# Patient Record
Sex: Male | Born: 1945 | Race: White | Hispanic: No | Marital: Single | Smoking: Never smoker
Health system: Southern US, Community
[De-identification: ages and names within clinical notes are randomized; demographics above are authoritative.]

## PROBLEM LIST (undated history)

## (undated) DIAGNOSIS — K469 Unspecified abdominal hernia without obstruction or gangrene: Secondary | ICD-10-CM

## (undated) DIAGNOSIS — I4891 Unspecified atrial fibrillation: Secondary | ICD-10-CM

## (undated) DIAGNOSIS — G4730 Sleep apnea, unspecified: Secondary | ICD-10-CM

## (undated) DIAGNOSIS — F329 Major depressive disorder, single episode, unspecified: Secondary | ICD-10-CM

## (undated) DIAGNOSIS — I10 Essential (primary) hypertension: Secondary | ICD-10-CM

## (undated) DIAGNOSIS — Z950 Presence of cardiac pacemaker: Secondary | ICD-10-CM

## (undated) DIAGNOSIS — H269 Unspecified cataract: Secondary | ICD-10-CM

## (undated) DIAGNOSIS — D689 Coagulation defect, unspecified: Secondary | ICD-10-CM

## (undated) DIAGNOSIS — F419 Anxiety disorder, unspecified: Secondary | ICD-10-CM

## (undated) DIAGNOSIS — I4892 Unspecified atrial flutter: Secondary | ICD-10-CM

## (undated) DIAGNOSIS — Z5189 Encounter for other specified aftercare: Secondary | ICD-10-CM

## (undated) DIAGNOSIS — R001 Bradycardia, unspecified: Secondary | ICD-10-CM

## (undated) DIAGNOSIS — I2510 Atherosclerotic heart disease of native coronary artery without angina pectoris: Secondary | ICD-10-CM

## (undated) DIAGNOSIS — F32A Depression, unspecified: Secondary | ICD-10-CM

## (undated) DIAGNOSIS — E785 Hyperlipidemia, unspecified: Secondary | ICD-10-CM

## (undated) DIAGNOSIS — M1990 Unspecified osteoarthritis, unspecified site: Secondary | ICD-10-CM

## (undated) HISTORY — PX: PACEMAKER INSERTION: SHX728

## (undated) HISTORY — DX: Coagulation defect, unspecified: D68.9

## (undated) HISTORY — DX: Atherosclerotic heart disease of native coronary artery without angina pectoris: I25.10

## (undated) HISTORY — DX: Essential (primary) hypertension: I10

## (undated) HISTORY — PX: TONSILLECTOMY: SUR1361

## (undated) HISTORY — PX: PERMANENT PACEMAKER INSERTION: SHX6023

## (undated) HISTORY — DX: Hyperlipidemia, unspecified: E78.5

## (undated) HISTORY — PX: OTHER SURGICAL HISTORY: SHX169

## (undated) HISTORY — DX: Unspecified cataract: H26.9

## (undated) HISTORY — DX: Sleep apnea, unspecified: G47.30

## (undated) HISTORY — DX: Encounter for other specified aftercare: Z51.89

## (undated) HISTORY — DX: Major depressive disorder, single episode, unspecified: F32.9

## (undated) HISTORY — DX: Bradycardia, unspecified: R00.1

## (undated) HISTORY — DX: Depression, unspecified: F32.A

## (undated) HISTORY — DX: Unspecified osteoarthritis, unspecified site: M19.90

## (undated) HISTORY — DX: Unspecified atrial fibrillation: I48.91

## (undated) HISTORY — DX: Presence of cardiac pacemaker: Z95.0

## (undated) HISTORY — DX: Unspecified atrial flutter: I48.92

## (undated) HISTORY — PX: CATARACT EXTRACTION: SUR2

## (undated) HISTORY — PX: REPLACEMENT TOTAL KNEE: SUR1224

## (undated) HISTORY — DX: Unspecified abdominal hernia without obstruction or gangrene: K46.9

## (undated) HISTORY — DX: Anxiety disorder, unspecified: F41.9

## (undated) HISTORY — PX: COLONOSCOPY: SHX174

---

## 1998-06-07 ENCOUNTER — Encounter: Payer: Self-pay | Admitting: Family Medicine

## 1998-06-07 ENCOUNTER — Ambulatory Visit (HOSPITAL_COMMUNITY): Admission: RE | Admit: 1998-06-07 | Discharge: 1998-06-07 | Payer: Self-pay | Admitting: Family Medicine

## 1999-10-06 ENCOUNTER — Ambulatory Visit (HOSPITAL_BASED_OUTPATIENT_CLINIC_OR_DEPARTMENT_OTHER): Admission: RE | Admit: 1999-10-06 | Discharge: 1999-10-06 | Payer: Self-pay | Admitting: Orthopedic Surgery

## 2002-09-29 ENCOUNTER — Inpatient Hospital Stay (HOSPITAL_COMMUNITY): Admission: EM | Admit: 2002-09-29 | Discharge: 2002-10-02 | Payer: Self-pay | Admitting: Specialist

## 2003-12-11 ENCOUNTER — Ambulatory Visit (HOSPITAL_COMMUNITY): Admission: RE | Admit: 2003-12-11 | Discharge: 2003-12-12 | Payer: Self-pay | Admitting: Internal Medicine

## 2004-05-27 ENCOUNTER — Ambulatory Visit: Payer: Self-pay

## 2004-06-18 ENCOUNTER — Ambulatory Visit: Payer: Self-pay | Admitting: Cardiology

## 2004-06-25 ENCOUNTER — Ambulatory Visit: Payer: Self-pay | Admitting: Cardiology

## 2004-07-09 ENCOUNTER — Ambulatory Visit: Payer: Self-pay | Admitting: Cardiology

## 2004-07-24 ENCOUNTER — Ambulatory Visit: Payer: Self-pay | Admitting: *Deleted

## 2004-08-07 ENCOUNTER — Ambulatory Visit: Payer: Self-pay | Admitting: Internal Medicine

## 2004-09-05 ENCOUNTER — Ambulatory Visit: Payer: Self-pay | Admitting: Cardiology

## 2004-09-12 ENCOUNTER — Ambulatory Visit (HOSPITAL_COMMUNITY): Admission: RE | Admit: 2004-09-12 | Discharge: 2004-09-12 | Payer: Self-pay | Admitting: Internal Medicine

## 2004-09-26 ENCOUNTER — Ambulatory Visit: Payer: Self-pay | Admitting: Family Medicine

## 2004-10-02 ENCOUNTER — Ambulatory Visit: Payer: Self-pay | Admitting: Family Medicine

## 2004-10-06 ENCOUNTER — Ambulatory Visit: Payer: Self-pay | Admitting: Cardiology

## 2004-10-20 ENCOUNTER — Ambulatory Visit: Payer: Self-pay | Admitting: Family Medicine

## 2004-11-03 ENCOUNTER — Ambulatory Visit: Payer: Self-pay | Admitting: Cardiology

## 2004-11-18 ENCOUNTER — Ambulatory Visit: Payer: Self-pay | Admitting: *Deleted

## 2004-12-05 ENCOUNTER — Ambulatory Visit: Payer: Self-pay | Admitting: Cardiology

## 2004-12-12 ENCOUNTER — Ambulatory Visit: Payer: Self-pay | Admitting: Cardiovascular Disease

## 2004-12-26 ENCOUNTER — Ambulatory Visit: Payer: Self-pay | Admitting: *Deleted

## 2005-01-23 ENCOUNTER — Ambulatory Visit: Payer: Self-pay | Admitting: Cardiology

## 2005-02-06 ENCOUNTER — Ambulatory Visit: Payer: Self-pay | Admitting: Cardiology

## 2005-02-27 ENCOUNTER — Ambulatory Visit: Payer: Self-pay | Admitting: Cardiology

## 2005-03-13 ENCOUNTER — Ambulatory Visit: Payer: Self-pay | Admitting: Internal Medicine

## 2005-03-27 ENCOUNTER — Ambulatory Visit: Payer: Self-pay | Admitting: Cardiology

## 2005-04-06 ENCOUNTER — Ambulatory Visit: Payer: Self-pay | Admitting: Internal Medicine

## 2005-04-07 ENCOUNTER — Ambulatory Visit: Payer: Self-pay | Admitting: Internal Medicine

## 2005-04-08 ENCOUNTER — Ambulatory Visit: Payer: Self-pay | Admitting: Internal Medicine

## 2005-04-14 ENCOUNTER — Encounter: Admission: RE | Admit: 2005-04-14 | Discharge: 2005-04-14 | Payer: Self-pay | Admitting: Family Medicine

## 2005-04-14 ENCOUNTER — Ambulatory Visit: Payer: Self-pay | Admitting: Internal Medicine

## 2005-04-14 ENCOUNTER — Ambulatory Visit: Payer: Self-pay | Admitting: Family Medicine

## 2005-04-21 ENCOUNTER — Ambulatory Visit: Payer: Self-pay | Admitting: *Deleted

## 2005-05-08 ENCOUNTER — Ambulatory Visit: Payer: Self-pay | Admitting: Cardiology

## 2005-06-08 ENCOUNTER — Ambulatory Visit: Payer: Self-pay | Admitting: Cardiology

## 2005-06-22 ENCOUNTER — Ambulatory Visit: Payer: Self-pay | Admitting: Cardiology

## 2005-07-10 ENCOUNTER — Ambulatory Visit: Payer: Self-pay | Admitting: Internal Medicine

## 2005-07-23 ENCOUNTER — Ambulatory Visit: Payer: Self-pay | Admitting: *Deleted

## 2005-08-10 ENCOUNTER — Ambulatory Visit: Payer: Self-pay | Admitting: Internal Medicine

## 2005-09-07 ENCOUNTER — Ambulatory Visit: Payer: Self-pay | Admitting: Internal Medicine

## 2005-09-16 ENCOUNTER — Ambulatory Visit: Payer: Self-pay | Admitting: Family Medicine

## 2005-09-23 ENCOUNTER — Ambulatory Visit: Payer: Self-pay | Admitting: Family Medicine

## 2005-10-05 ENCOUNTER — Ambulatory Visit: Payer: Self-pay

## 2005-10-06 ENCOUNTER — Ambulatory Visit: Payer: Self-pay | Admitting: *Deleted

## 2005-11-03 ENCOUNTER — Ambulatory Visit: Payer: Self-pay | Admitting: Cardiology

## 2005-11-23 ENCOUNTER — Ambulatory Visit: Payer: Self-pay | Admitting: Cardiology

## 2005-12-22 ENCOUNTER — Ambulatory Visit: Payer: Self-pay | Admitting: Internal Medicine

## 2006-01-04 ENCOUNTER — Ambulatory Visit: Payer: Self-pay | Admitting: Internal Medicine

## 2006-01-19 ENCOUNTER — Ambulatory Visit: Payer: Self-pay | Admitting: Cardiovascular Disease

## 2006-02-25 ENCOUNTER — Ambulatory Visit: Payer: Self-pay | Admitting: Cardiology

## 2006-03-11 ENCOUNTER — Ambulatory Visit: Payer: Self-pay | Admitting: Cardiology

## 2006-04-08 ENCOUNTER — Ambulatory Visit: Payer: Self-pay | Admitting: Cardiology

## 2006-05-06 ENCOUNTER — Ambulatory Visit: Payer: Self-pay | Admitting: Cardiology

## 2006-05-31 ENCOUNTER — Ambulatory Visit: Payer: Self-pay | Admitting: Cardiology

## 2006-06-11 ENCOUNTER — Ambulatory Visit: Payer: Self-pay | Admitting: Cardiovascular Disease

## 2006-06-23 ENCOUNTER — Ambulatory Visit: Payer: Self-pay | Admitting: Cardiovascular Disease

## 2006-06-29 ENCOUNTER — Ambulatory Visit: Payer: Self-pay | Admitting: Internal Medicine

## 2006-07-07 ENCOUNTER — Ambulatory Visit: Payer: Self-pay | Admitting: *Deleted

## 2006-07-14 ENCOUNTER — Ambulatory Visit (HOSPITAL_COMMUNITY): Admission: RE | Admit: 2006-07-14 | Discharge: 2006-07-14 | Payer: Self-pay | Admitting: Internal Medicine

## 2006-07-21 ENCOUNTER — Ambulatory Visit (HOSPITAL_COMMUNITY): Admission: RE | Admit: 2006-07-21 | Discharge: 2006-07-21 | Payer: Self-pay | Admitting: Internal Medicine

## 2006-07-28 ENCOUNTER — Ambulatory Visit: Payer: Self-pay | Admitting: Gastroenterology

## 2006-08-04 ENCOUNTER — Ambulatory Visit: Payer: Self-pay | Admitting: Cardiology

## 2006-08-11 ENCOUNTER — Ambulatory Visit (HOSPITAL_COMMUNITY): Admission: RE | Admit: 2006-08-11 | Discharge: 2006-08-11 | Payer: Self-pay | Admitting: Internal Medicine

## 2006-08-24 ENCOUNTER — Ambulatory Visit: Payer: Self-pay | Admitting: Internal Medicine

## 2006-08-25 ENCOUNTER — Ambulatory Visit: Payer: Self-pay | Admitting: Internal Medicine

## 2006-08-26 ENCOUNTER — Ambulatory Visit: Payer: Self-pay | Admitting: Gastroenterology

## 2006-08-26 ENCOUNTER — Encounter (INDEPENDENT_AMBULATORY_CARE_PROVIDER_SITE_OTHER): Payer: Self-pay | Admitting: *Deleted

## 2006-09-02 ENCOUNTER — Ambulatory Visit: Payer: Self-pay | Admitting: Cardiology

## 2006-09-17 ENCOUNTER — Ambulatory Visit: Payer: Self-pay | Admitting: Internal Medicine

## 2006-09-28 ENCOUNTER — Ambulatory Visit: Payer: Self-pay | Admitting: Cardiology

## 2006-10-12 ENCOUNTER — Ambulatory Visit: Payer: Self-pay | Admitting: Cardiology

## 2006-10-19 ENCOUNTER — Ambulatory Visit: Payer: Self-pay | Admitting: Internal Medicine

## 2006-11-09 ENCOUNTER — Ambulatory Visit: Payer: Self-pay | Admitting: *Deleted

## 2006-11-23 ENCOUNTER — Ambulatory Visit: Payer: Self-pay | Admitting: Cardiology

## 2006-12-07 ENCOUNTER — Ambulatory Visit: Payer: Self-pay | Admitting: Cardiology

## 2006-12-15 ENCOUNTER — Ambulatory Visit: Payer: Self-pay | Admitting: Internal Medicine

## 2006-12-28 ENCOUNTER — Ambulatory Visit: Payer: Self-pay | Admitting: Cardiology

## 2007-01-11 ENCOUNTER — Ambulatory Visit: Payer: Self-pay | Admitting: Internal Medicine

## 2007-01-25 ENCOUNTER — Ambulatory Visit: Payer: Self-pay | Admitting: Cardiology

## 2007-01-27 ENCOUNTER — Ambulatory Visit: Payer: Self-pay | Admitting: Family Medicine

## 2007-02-01 ENCOUNTER — Encounter: Payer: Self-pay | Admitting: Family Medicine

## 2007-02-01 DIAGNOSIS — E785 Hyperlipidemia, unspecified: Secondary | ICD-10-CM | POA: Insufficient documentation

## 2007-02-01 DIAGNOSIS — I2510 Atherosclerotic heart disease of native coronary artery without angina pectoris: Secondary | ICD-10-CM

## 2007-02-01 DIAGNOSIS — Z8679 Personal history of other diseases of the circulatory system: Secondary | ICD-10-CM | POA: Insufficient documentation

## 2007-02-01 DIAGNOSIS — F411 Generalized anxiety disorder: Secondary | ICD-10-CM | POA: Insufficient documentation

## 2007-02-01 DIAGNOSIS — I10 Essential (primary) hypertension: Secondary | ICD-10-CM

## 2007-02-01 DIAGNOSIS — Z8739 Personal history of other diseases of the musculoskeletal system and connective tissue: Secondary | ICD-10-CM

## 2007-02-08 ENCOUNTER — Ambulatory Visit: Payer: Self-pay | Admitting: Internal Medicine

## 2007-02-22 ENCOUNTER — Ambulatory Visit: Payer: Self-pay | Admitting: Cardiology

## 2007-02-22 LAB — CONVERTED CEMR LAB
Alkaline Phosphatase: 64 units/L (ref 39–117)
Bilirubin, Direct: 0.1 mg/dL (ref 0.0–0.3)
Total Bilirubin: 1 mg/dL (ref 0.3–1.2)
Total Protein: 6.4 g/dL (ref 6.0–8.3)

## 2007-03-08 ENCOUNTER — Ambulatory Visit: Payer: Self-pay | Admitting: Cardiology

## 2007-03-08 ENCOUNTER — Ambulatory Visit: Payer: Self-pay | Admitting: Internal Medicine

## 2007-03-31 ENCOUNTER — Ambulatory Visit: Payer: Self-pay | Admitting: Cardiology

## 2007-04-05 ENCOUNTER — Ambulatory Visit: Payer: Self-pay | Admitting: Internal Medicine

## 2007-04-20 ENCOUNTER — Ambulatory Visit: Payer: Self-pay | Admitting: Internal Medicine

## 2007-05-03 ENCOUNTER — Ambulatory Visit: Payer: Self-pay | Admitting: Internal Medicine

## 2007-05-04 ENCOUNTER — Ambulatory Visit: Payer: Self-pay | Admitting: Internal Medicine

## 2007-05-18 ENCOUNTER — Ambulatory Visit: Payer: Self-pay | Admitting: Cardiology

## 2007-05-31 ENCOUNTER — Ambulatory Visit: Payer: Self-pay | Admitting: Internal Medicine

## 2007-06-01 ENCOUNTER — Ambulatory Visit: Payer: Self-pay | Admitting: Cardiology

## 2007-06-15 ENCOUNTER — Ambulatory Visit: Payer: Self-pay | Admitting: Cardiology

## 2007-07-01 ENCOUNTER — Ambulatory Visit: Payer: Self-pay | Admitting: Internal Medicine

## 2007-07-13 ENCOUNTER — Ambulatory Visit: Payer: Self-pay | Admitting: Cardiovascular Disease

## 2007-07-26 ENCOUNTER — Ambulatory Visit: Payer: Self-pay | Admitting: Internal Medicine

## 2007-08-01 ENCOUNTER — Ambulatory Visit: Payer: Self-pay | Admitting: Cardiology

## 2007-08-23 ENCOUNTER — Ambulatory Visit: Payer: Self-pay | Admitting: Internal Medicine

## 2007-08-29 ENCOUNTER — Ambulatory Visit: Payer: Self-pay | Admitting: Internal Medicine

## 2007-09-19 ENCOUNTER — Ambulatory Visit: Payer: Self-pay | Admitting: Cardiology

## 2007-10-10 ENCOUNTER — Ambulatory Visit: Payer: Self-pay | Admitting: Internal Medicine

## 2007-10-21 ENCOUNTER — Ambulatory Visit: Payer: Self-pay | Admitting: Cardiology

## 2007-11-11 ENCOUNTER — Ambulatory Visit: Payer: Self-pay | Admitting: Internal Medicine

## 2007-11-22 ENCOUNTER — Ambulatory Visit: Payer: Self-pay | Admitting: Internal Medicine

## 2007-12-02 ENCOUNTER — Ambulatory Visit: Payer: Self-pay | Admitting: Cardiology

## 2007-12-30 ENCOUNTER — Ambulatory Visit: Payer: Self-pay | Admitting: Cardiovascular Disease

## 2008-01-18 ENCOUNTER — Ambulatory Visit: Payer: Self-pay | Admitting: Family Medicine

## 2008-01-18 DIAGNOSIS — L03119 Cellulitis of unspecified part of limb: Secondary | ICD-10-CM

## 2008-01-18 DIAGNOSIS — L02419 Cutaneous abscess of limb, unspecified: Secondary | ICD-10-CM | POA: Insufficient documentation

## 2008-01-19 ENCOUNTER — Telehealth: Payer: Self-pay | Admitting: Family Medicine

## 2008-01-24 ENCOUNTER — Encounter: Payer: Self-pay | Admitting: Family Medicine

## 2008-01-25 ENCOUNTER — Ambulatory Visit: Payer: Self-pay | Admitting: Internal Medicine

## 2008-02-21 ENCOUNTER — Ambulatory Visit: Payer: Self-pay | Admitting: Internal Medicine

## 2008-02-27 ENCOUNTER — Ambulatory Visit: Payer: Self-pay | Admitting: Cardiology

## 2008-03-26 ENCOUNTER — Ambulatory Visit: Payer: Self-pay | Admitting: Cardiology

## 2008-04-09 ENCOUNTER — Ambulatory Visit: Payer: Self-pay | Admitting: Cardiology

## 2008-04-17 ENCOUNTER — Ambulatory Visit: Payer: Self-pay | Admitting: Cardiology

## 2008-05-01 ENCOUNTER — Ambulatory Visit: Payer: Self-pay | Admitting: Cardiovascular Disease

## 2008-05-22 ENCOUNTER — Ambulatory Visit: Payer: Self-pay | Admitting: Internal Medicine

## 2008-05-28 ENCOUNTER — Ambulatory Visit: Payer: Self-pay | Admitting: Cardiovascular Disease

## 2008-06-25 ENCOUNTER — Ambulatory Visit: Payer: Self-pay | Admitting: Cardiovascular Disease

## 2008-08-03 ENCOUNTER — Ambulatory Visit: Payer: Self-pay | Admitting: Cardiology

## 2008-08-15 ENCOUNTER — Encounter (INDEPENDENT_AMBULATORY_CARE_PROVIDER_SITE_OTHER): Payer: Self-pay | Admitting: *Deleted

## 2008-08-21 ENCOUNTER — Ambulatory Visit: Payer: Self-pay | Admitting: Internal Medicine

## 2008-08-30 ENCOUNTER — Ambulatory Visit: Payer: Self-pay | Admitting: Internal Medicine

## 2008-09-04 ENCOUNTER — Telehealth: Payer: Self-pay | Admitting: Family Medicine

## 2008-09-24 ENCOUNTER — Ambulatory Visit: Payer: Self-pay | Admitting: Cardiology

## 2008-09-24 ENCOUNTER — Ambulatory Visit: Payer: Self-pay | Admitting: Internal Medicine

## 2008-09-24 LAB — CONVERTED CEMR LAB
ALT: 22 units/L (ref 0–53)
TSH: 1.41 microintl units/mL (ref 0.35–5.50)
Total Bilirubin: 0.6 mg/dL (ref 0.3–1.2)

## 2008-10-08 ENCOUNTER — Ambulatory Visit: Payer: Self-pay | Admitting: Cardiology

## 2008-10-25 ENCOUNTER — Ambulatory Visit: Payer: Self-pay | Admitting: Cardiovascular Disease

## 2008-11-20 ENCOUNTER — Ambulatory Visit: Payer: Self-pay | Admitting: Internal Medicine

## 2008-11-27 ENCOUNTER — Ambulatory Visit: Payer: Self-pay | Admitting: Internal Medicine

## 2008-12-25 ENCOUNTER — Encounter: Payer: Self-pay | Admitting: *Deleted

## 2008-12-25 ENCOUNTER — Ambulatory Visit: Payer: Self-pay | Admitting: Cardiology

## 2008-12-25 LAB — CONVERTED CEMR LAB
POC INR: 3.6
Protime: 22.8

## 2009-01-30 ENCOUNTER — Encounter: Payer: Self-pay | Admitting: *Deleted

## 2009-02-04 ENCOUNTER — Ambulatory Visit: Payer: Self-pay | Admitting: Cardiology

## 2009-02-04 ENCOUNTER — Encounter (INDEPENDENT_AMBULATORY_CARE_PROVIDER_SITE_OTHER): Payer: Self-pay | Admitting: Cardiology

## 2009-02-04 LAB — CONVERTED CEMR LAB: Prothrombin Time: 16.5 s

## 2009-02-19 ENCOUNTER — Ambulatory Visit: Payer: Self-pay | Admitting: Internal Medicine

## 2009-02-25 ENCOUNTER — Ambulatory Visit: Payer: Self-pay | Admitting: Internal Medicine

## 2009-03-18 ENCOUNTER — Ambulatory Visit: Payer: Self-pay | Admitting: Cardiovascular Disease

## 2009-03-25 ENCOUNTER — Encounter (INDEPENDENT_AMBULATORY_CARE_PROVIDER_SITE_OTHER): Payer: Self-pay | Admitting: *Deleted

## 2009-04-02 ENCOUNTER — Ambulatory Visit: Payer: Self-pay | Admitting: Cardiology

## 2009-04-02 LAB — CONVERTED CEMR LAB: POC INR: 4.2

## 2009-04-15 ENCOUNTER — Encounter: Payer: Self-pay | Admitting: Internal Medicine

## 2009-04-15 ENCOUNTER — Ambulatory Visit: Payer: Self-pay

## 2009-04-16 ENCOUNTER — Ambulatory Visit: Payer: Self-pay | Admitting: Cardiology

## 2009-04-16 LAB — CONVERTED CEMR LAB: POC INR: 3.1

## 2009-05-01 ENCOUNTER — Ambulatory Visit: Payer: Self-pay | Admitting: Family Medicine

## 2009-05-01 DIAGNOSIS — T148XXA Other injury of unspecified body region, initial encounter: Secondary | ICD-10-CM | POA: Insufficient documentation

## 2009-05-15 ENCOUNTER — Ambulatory Visit: Payer: Self-pay | Admitting: Internal Medicine

## 2009-05-30 ENCOUNTER — Ambulatory Visit: Payer: Self-pay | Admitting: Family Medicine

## 2009-06-12 ENCOUNTER — Ambulatory Visit: Payer: Self-pay | Admitting: Cardiovascular Disease

## 2009-06-12 LAB — CONVERTED CEMR LAB: POC INR: 3

## 2009-08-16 ENCOUNTER — Encounter (INDEPENDENT_AMBULATORY_CARE_PROVIDER_SITE_OTHER): Payer: Self-pay | Admitting: Cardiology

## 2009-08-30 ENCOUNTER — Ambulatory Visit: Payer: Self-pay | Admitting: Internal Medicine

## 2009-08-30 LAB — CONVERTED CEMR LAB: POC INR: 3.1

## 2009-09-18 ENCOUNTER — Ambulatory Visit: Payer: Self-pay | Admitting: Internal Medicine

## 2009-09-20 ENCOUNTER — Ambulatory Visit: Payer: Self-pay | Admitting: Cardiology

## 2009-09-20 LAB — CONVERTED CEMR LAB: POC INR: 2.3

## 2009-10-08 ENCOUNTER — Ambulatory Visit: Payer: Self-pay | Admitting: Internal Medicine

## 2009-10-08 DIAGNOSIS — I4891 Unspecified atrial fibrillation: Secondary | ICD-10-CM

## 2009-10-08 DIAGNOSIS — Z950 Presence of cardiac pacemaker: Secondary | ICD-10-CM

## 2009-10-14 LAB — CONVERTED CEMR LAB
Albumin: 3.9 g/dL (ref 3.5–5.2)
Bilirubin, Direct: 0.1 mg/dL (ref 0.0–0.3)
Total Protein: 7.2 g/dL (ref 6.0–8.3)

## 2009-10-28 ENCOUNTER — Ambulatory Visit: Payer: Self-pay | Admitting: Cardiovascular Disease

## 2009-10-28 LAB — CONVERTED CEMR LAB: POC INR: 2.7

## 2009-12-12 ENCOUNTER — Ambulatory Visit: Payer: Self-pay | Admitting: Internal Medicine

## 2009-12-12 LAB — CONVERTED CEMR LAB: POC INR: 2.6

## 2010-01-22 ENCOUNTER — Ambulatory Visit: Payer: Self-pay | Admitting: Internal Medicine

## 2010-02-11 ENCOUNTER — Ambulatory Visit: Payer: Self-pay | Admitting: Internal Medicine

## 2010-03-28 ENCOUNTER — Ambulatory Visit: Payer: Self-pay | Admitting: Cardiology

## 2010-04-14 ENCOUNTER — Encounter: Payer: Self-pay | Admitting: Internal Medicine

## 2010-04-14 ENCOUNTER — Ambulatory Visit: Payer: Self-pay

## 2010-04-23 ENCOUNTER — Ambulatory Visit: Payer: Self-pay | Admitting: Internal Medicine

## 2010-04-29 ENCOUNTER — Ambulatory Visit: Payer: Self-pay | Admitting: Cardiology

## 2010-05-27 ENCOUNTER — Ambulatory Visit: Payer: Self-pay | Admitting: Cardiovascular Disease

## 2010-06-26 ENCOUNTER — Ambulatory Visit: Payer: Self-pay | Admitting: Cardiovascular Disease

## 2010-07-09 ENCOUNTER — Telehealth: Payer: Self-pay | Admitting: Internal Medicine

## 2010-07-29 ENCOUNTER — Ambulatory Visit: Admission: RE | Admit: 2010-07-29 | Discharge: 2010-07-29 | Payer: Self-pay | Source: Home / Self Care

## 2010-08-26 ENCOUNTER — Encounter: Payer: Self-pay | Admitting: Internal Medicine

## 2010-08-26 DIAGNOSIS — I495 Sick sinus syndrome: Secondary | ICD-10-CM

## 2010-08-28 ENCOUNTER — Ambulatory Visit: Admit: 2010-08-28 | Payer: Self-pay

## 2010-08-28 NOTE — Medication Information (Signed)
Summary: ROV/JAJ  Anticoagulant Therapy  Managed by: Cloyde Reams, RN, BSN Referring MD: Lewayne Bunting PCP: Judithann Sheen MD Supervising MD: Jens Som MD, Arlys John Indication 1: Atrial Fibrillation (ICD-427.31) Lab Used: [facility] [place] Site: [address] INR POC 1.8 INR RANGE 2 - 3  Dietary changes: no    Health status changes: no    Bleeding/hemorrhagic complications: no    Recent/future hospitalizations: no    Any changes in medication regimen? no    Recent/future dental: no  Any missed doses?: no       Is patient compliant with meds? yes       Allergies: No Known Drug Allergies  Anticoagulation Management History:      The patient is taking warfarin and comes in today for a routine follow up visit.  Negative risk factors for bleeding include an age less than 40 years old.  The bleeding index is 'low risk'.  Positive CHADS2 values include History of HTN.  Negative CHADS2 values include Age > 36 years old.  The start date was 05/06/2003.  Anticoagulation responsible Soha Thorup: Jens Som MD, Arlys John.  INR POC: 1.8.  Cuvette Lot#: [id number].  Exp: 05/2011.    Anticoagulation Management Assessment/Plan:      The patient's current anticoagulation dose is Coumadin 2 mg tabs: Take as directed by coumadin clinic..  The target INR is 2 - 3.  The next INR is due 05/27/2010.  Anticoagulation instructions were given to patient.  Results were reviewed/authorized by Cloyde Reams, RN, BSN.  He was notified by Cloyde Reams RN.         Prior Anticoagulation Instructions: INR 2.6  Continue taking 1 tablet every day except take 2 tablets on Monday. Re-check INR on 04/28/10.   Current Anticoagulation Instructions: INR 1.8  Take an extra tablet today, then resume same dosage 1 tablet daily except 2 tablets on Mondays.  Recheck in 3 weeks.

## 2010-08-28 NOTE — Procedures (Signed)
Summary: pc2      Allergies Added: NKDA  Current Medications (verified): 1)  Amiodarone Hcl 200 Mg Tabs (Amiodarone Hcl) .Marland Kitchen.. 1 Tablet By Mouth Once A Day 2)  Coumadin 2 Mg Tabs (Warfarin Sodium) .... Take As Directed By Coumadin Clinic. 3)  Sertraline Hcl 100 Mg Tabs (Sertraline Hcl) .... Once Daily 4)  Abilify 30 Mg Tabs (Aripiprazole) .... 1/2 Daily 5)  Hydrochlorothiazide 25 Mg Tabs (Hydrochlorothiazide) .... Take One Tablet By Mouth Daily. 6)  Calcium 500 + D 500-125 Mg-Unit Tabs (Calcium Carbonate-Vitamin D) .... One By Mouth Two Times A Day  Allergies (verified): No Known Drug Allergies  PPM Specifications Following MD:  Lewayne Bunting, MD     PPM Vendor:  [employer]     PPM Model Number:  [id number]     PPM Serial Number:  [id number] PPM DOI:  12/11/2003      Lead 1    Location: RA     DOI: 12/11/2003     Model #: [id number]     Serial #: [id number]     Status: active Lead 2    Location: RV     DOI: 12/11/2003     Model #: [id number]     Serial #: [id number]     Status: active  Magnet Response Rate:  BOL 100 ERI 85  Indications:  Tachy-brady syndrome  Explantation Comments:  TTM'S /MEDNET  PPM Follow Up Battery Voltage:  GOOD V     Battery Est. Longevity:  2 YRS     Pacer Dependent:  No       PPM Device Measurements Atrium  Amplitude: 2.0 mV, Impedance: 590 ohms, Threshold: 0.7 V at 0.40 msec Right Ventricle  Amplitude: 4.0 mV, Impedance: 840 ohms, Threshold: 1.0 V at 0.4 msec  Episodes MS Episodes:  0     Coumadin:  Yes Ventricular High Rate:  0     Atrial Pacing:  88%     Ventricular Pacing:  51%  Parameters Mode:  DDIR     Lower Rate Limit:  60     Upper Rate Limit:  140 Paced AV Delay:  260     Next Cardiology Appt Due:  09/25/2010 Tech Comments:  NORMAL DEVICE FUNCTION.  NO EPISODES SINCE LAST CHECK.  NO CHANGES MADE. ROV IN 6 MTHS W/GT. Vella Kohler  April 14, 2010 4:36 PM

## 2010-08-28 NOTE — Cardiovascular Report (Signed)
Summary: TTM   TTM   Imported By: Roderic Ovens 05/06/2010 13:05:28  _____________________________________________________________________  External Attachment:    Type:   Image     Comment:   External Document

## 2010-08-28 NOTE — Medication Information (Signed)
Summary: ccr/jss  Anticoagulant Therapy  Managed by: Bethena Midget, RN, BSN Referring MD: Lewayne Bunting Supervising MD: Johney Frame MD, Fayrene Fearing Indication 1: Atrial Fibrillation (ICD-427.31) Lab Used: [facility] [place] Site: [address] INR POC 3.1 INR RANGE 2 - 3  Dietary changes: no    Health status changes: no    Bleeding/hemorrhagic complications: no    Recent/future hospitalizations: no    Any changes in medication regimen? no    Recent/future dental: no  Any missed doses?: no       Is patient compliant with meds? yes      Comments: Pt. states he's been taking 2mg s everyday except 3mg s on Mondays.   Allergies: No Known Drug Allergies  Anticoagulation Management History:      The patient is taking warfarin and comes in today for a routine follow up visit.  Negative risk factors for bleeding include an age less than 73 years old.  The bleeding index is 'low risk'.  Positive CHADS2 values include History of HTN.  Negative CHADS2 values include Age > 74 years old.  The start date was 05/06/2003.  Anticoagulation responsible provider: Eastin Swing MD, Fayrene Fearing.  INR POC: 3.1.  Cuvette Lot#: [id number].  Exp: 10/2010.    Anticoagulation Management Assessment/Plan:      The patient's current anticoagulation dose is Coumadin 2 mg tabs: Take as directed by coumadin clinic..  The target INR is 2 - 3.  The next INR is due 09/20/2009.  Anticoagulation instructions were given to patient.  Results were reviewed/authorized by Bethena Midget, RN, BSN.  He was notified by Bethena Midget, RN, BSN.         Prior Anticoagulation Instructions: INR 3.0 Continue 2mg s everyday except 3mg s on Mondays and Fridays. Recheck in 4 weeks.   Current Anticoagulation Instructions: INR 3.1 Tomorrow skip coumadin  then resume 2mg s everyday except 3mg s on Mondays. Recheck in 3 weeks.

## 2010-08-28 NOTE — Medication Information (Signed)
Summary: overdue for coumadin f/u/ewj  Anticoagulant Therapy  Managed by: Cloyde Reams, RN, BSN Referring MD: Lewayne Bunting PCP: Judithann Sheen MD Supervising MD: Graciela Husbands MD, Viviann Spare Indication 1: Atrial Fibrillation (ICD-427.31) Lab Used: [facility] [place] Site: [address] INR POC 1.8 INR RANGE 2 - 3  Dietary changes: no    Health status changes: no    Bleeding/hemorrhagic complications: no    Recent/future hospitalizations: no    Any changes in medication regimen? yes       Details: Taking OTC Iron and Calcium   Recent/future dental: no  Any missed doses?: no       Is patient compliant with meds? yes      Comments: Pt states he has been taking 1 tablet daily except 2 tablets on Mondays.  Allergies: No Known Drug Allergies  Anticoagulation Management History:      The patient is taking warfarin and comes in today for a routine follow up visit.  Negative risk factors for bleeding include an age less than 66 years old.  The bleeding index is 'low risk'.  Positive CHADS2 values include History of HTN.  Negative CHADS2 values include Age > 61 years old.  The start date was 05/06/2003.  Anticoagulation responsible provider: Graciela Husbands MD, Viviann Spare.  INR POC: 1.8.  Cuvette Lot#: [id number].  Exp: 04/2011.    Anticoagulation Management Assessment/Plan:      The patient's current anticoagulation dose is Coumadin 2 mg tabs: Take as directed by coumadin clinic..  The target INR is 2 - 3.  The next INR is due 03/04/2010.  Anticoagulation instructions were given to patient.  Results were reviewed/authorized by Cloyde Reams, RN, BSN.  He was notified by Cloyde Reams RN.         Prior Anticoagulation Instructions: INR-2.6 Continue on normal schedule . Take 1.5 tablets on Monday and take1 tablet on all other days of the week. Return in 4 weeks.  Current Anticoagulation Instructions: INR 1.8  Take 1.5 tablets tomorrow, then resume same dosage 1 tablet daily except 2  tablets on Mondays.  Recheck in 3 weeks.

## 2010-08-28 NOTE — Assessment & Plan Note (Signed)
Summary: guidiant/saf      Allergies Added: NKDA  Visit Type:  Follow-up Primary Provider:  Judithann Sheen MD   History of Present Illness: Phillip Kline returns today for followup.  He is a very pleasant middle- aged male with symptomatic tachy-brady syndrome and paroxysmal AFib who has maintained sinus rhythm very nicely on amiodarone therapy.  He denies chest pain or shortness of breath.  He really has done quite well with regard to symptoms of AFib or bradycardia since his pacemaker was placed and amiodarone was initiated.    Current Medications (verified): 1)  Amiodarone Hcl 200 Mg Tabs (Amiodarone Hcl) .Marland Kitchen.. 1 Tablet By Mouth Once A Day 2)  Coumadin 2 Mg Tabs (Warfarin Sodium) .... Take As Directed By Coumadin Clinic. 3)  Sertraline Hcl 100 Mg Tabs (Sertraline Hcl) .... Once Daily 4)  Abilify 30 Mg Tabs (Aripiprazole) .... 1/2 Daily 5)  Hydrochlorothiazide 25 Mg Tabs (Hydrochlorothiazide) .... Take One Tablet By Mouth Daily. 6)  Calcium 500 + D 500-125 Mg-Unit Tabs (Calcium Carbonate-Vitamin D) .... One By Mouth Two Times A Day  Allergies (verified): No Known Drug Allergies  Past History:  Past Medical History: Last updated: 08/28/2008 Hyperlipidemia Hypertension Anxiety Coronary artery disease Atrial fibrillation and flutter Sinus bradycardia  Past Surgical History: Last updated: 08/28/2008 hernia repair Cataract extraction Total knee replacement rt  LEFT AND RIGHT THUMB SURGERY Permanent pacemaker 2005 [employer] Insignia DDD  Review of Systems  The patient denies chest pain, syncope, dyspnea on exertion, and peripheral edema.    Vital Signs:  Patient profile:   65 year old male Height:      64 inches Weight:      183 pounds BMI:     31.53 Pulse rate:   65 / minute BP sitting:   138 / 80  (left arm)  Vitals Entered By: Laurance Flatten CMA (October 08, 2009 3:43 PM)  Physical Exam  General:  Well developed, well nourished middle aged man, in no acute  distress.  HEENT: normal Neck: supple. No JVD. Carotids 2+ bilaterally no bruits Cor: RRR no rubs, gallops or murmur Lungs: CTA. No wheezes, rales, or rhonchi. Well healed PPM incision. Ab: soft, nontender. nondistended. No HSM. Good bowel sounds Ext: warm. no cyanosis, clubbing or edema Neuro: alert and oriented. Grossly nonfocal. affect pleasant    PPM Specifications Following MD:  Lewayne Bunting, MD     PPM Vendor:  [facility] [employer]     PPM Model Number:  [id number]     PPM Serial Number:  [id number] PPM DOI:  12/11/2003      Lead 1    Location: RA     DOI: 12/11/2003     Model #: [id number]     Serial #: [id number]     Status: active Lead 2    Location: RV     DOI: 12/11/2003     Model #: 1478     Serial #: [id number]     Status: active  Magnet Response Rate:  BOL 100 ERI 85  Indications:  Tachy-brady syndrome  Explantation Comments:  TTM'S /MEDNET  PPM Follow Up Remote Check?  No Battery Voltage:  good V     Battery Est. Longevity:  2.5 years     Pacer Dependent:  No       PPM Device Measurements Atrium  Amplitude: 1.5 mV, Impedance: 550 ohms, Threshold: 0.8 V at 0.4 msec Right Ventricle  Amplitude: 5.8 mV, Impedance: 910 ohms, Threshold: 0.9 V at 0.4 msec  Episodes Coumadin:  Yes Atrial Pacing:  86%     Ventricular Pacing:  41%  Parameters Mode:  DDIR     Lower Rate Limit:  60     Upper Rate Limit:  140 Paced AV Delay:  260     Next Cardiology Appt Due:  03/27/2010 Tech Comments:  No parameter changes.  Device function normal.  TTM's with [employer].  ROV 6 months clinic. Altha Harm, LPN  October 08, 2009 3:56 PM  MD Comments:  Agree with above.  Impression & Recommendations:  Problem # 1:  CARDIAC PACEMAKER IN SITU (ICD-V45.01) Her PPM is working normally and we will recheck in several months.  Problem # 2:  ATRIAL FIBRILLATION (ICD-427.31) He is maintaining NSR on his meds below.  Will check a liver panel and TSH. His updated medication list for this problem includes:    Amiodarone Hcl  200 Mg Tabs (Amiodarone hcl) .Marland Kitchen... 1 tablet by mouth once a day    Coumadin 2 Mg Tabs (Warfarin sodium) .Marland Kitchen... Take as directed by coumadin clinic.  Other Orders: TLB-Hepatic/Liver Function Pnl (80076-HEPATIC) TLB-TSH (Thyroid Stimulating Hormone) (01027-OZD)  Patient Instructions: 1)  Your physician recommends that you return for lab work GU:YQIH today Tsh and liver   .Marland KitchenMarland KitchenMarland Kitchenwe will call you with results 2)  Your physician recommends that you schedule a follow-up appointment in: 6 months with nurse pacer clinic

## 2010-08-28 NOTE — Cardiovascular Report (Signed)
Summary: Office Visit   Office Visit   Imported By: Roderic Ovens 05/05/2010 11:47:52  _____________________________________________________________________  External Attachment:    Type:   Image     Comment:   External Document

## 2010-08-28 NOTE — Medication Information (Signed)
Summary: Coumadin Clinic  Anticoagulant Therapy  Managed by: Weston Brass, PharmD Referring MD: Lewayne Bunting PCP: Judithann Sheen MD Supervising MD: Daleen Squibb MD, Maisie Fus Indication 1: Atrial Fibrillation (ICD-427.31) Lab Used: [facility] [place] Site: [address] INR POC 2.6 INR RANGE 2 - 3  Dietary changes: no    Health status changes: no    Bleeding/hemorrhagic complications: no    Recent/future hospitalizations: no    Any changes in medication regimen? no    Recent/future dental: no  Any missed doses?: no       Is patient compliant with meds? yes       Allergies: No Known Drug Allergies  Anticoagulation Management History:      The patient is taking warfarin and comes in today for a routine follow up visit.  Negative risk factors for bleeding include an age less than 63 years old.  The bleeding index is 'low risk'.  Positive CHADS2 values include History of HTN.  Negative CHADS2 values include Age > 56 years old.  The start date was 05/06/2003.  Anticoagulation responsible provider: Daleen Squibb MD, Maisie Fus.  INR POC: 2.6.  Cuvette Lot#: [id number].  Exp: 04/2011.    Anticoagulation Management Assessment/Plan:      The patient's current anticoagulation dose is Coumadin 2 mg tabs: Take as directed by coumadin clinic..  The target INR is 2 - 3.  The next INR is due 04/28/2010.  Anticoagulation instructions were given to patient.  Results were reviewed/authorized by Weston Brass, PharmD.  He was notified by Harrel Carina, PharmD candidate.         Prior Anticoagulation Instructions: INR 1.8  Take 1.5 tablets tomorrow, then resume same dosage 1 tablet daily except 2 tablets on Mondays.  Recheck in 3 weeks.    Current Anticoagulation Instructions: INR 2.6  Continue taking 1 tablet every day except take 2 tablets on Monday. Re-check INR on 04/28/10.

## 2010-08-28 NOTE — Medication Information (Signed)
Summary: ccr  Anticoagulant Therapy  Managed by: Weston Brass, PharmD Referring MD: Lewayne Bunting PCP: Judithann Sheen MD Supervising MD: Ladona Ridgel MD, Sharlot Gowda Indication 1: Atrial Fibrillation (ICD-427.31) Lab Used: [facility] [place] Site: [address] INR POC 2.6 INR RANGE 2 - 3  Dietary changes: no    Health status changes: no    Bleeding/hemorrhagic complications: no    Recent/future hospitalizations: no    Any changes in medication regimen? no    Recent/future dental: no  Any missed doses?: no       Is patient compliant with meds? yes       Allergies: No Known Drug Allergies  Anticoagulation Management History:      The patient is taking warfarin and comes in today for a routine follow up visit.  Negative risk factors for bleeding include an age less than 30 years old.  The bleeding index is 'low risk'.  Positive CHADS2 values include History of HTN.  Negative CHADS2 values include Age > 55 years old.  The start date was 05/06/2003.  Anticoagulation responsible provider: Ladona Ridgel MD, Sharlot Gowda.  INR POC: 2.6.  Cuvette Lot#: [id number].  Exp: 02/2011.    Anticoagulation Management Assessment/Plan:      The patient's current anticoagulation dose is Coumadin 2 mg tabs: Take as directed by coumadin clinic..  The target INR is 2 - 3.  The next INR is due 01/09/2010.  Anticoagulation instructions were given to patient.  Results were reviewed/authorized by Weston Brass, PharmD.  He was notified by Alcus Dad B Pharm.         Prior Anticoagulation Instructions: INR 2.7  Continue same dose of 1 tablet every day except 1 1/2 tablet on Monday   Current Anticoagulation Instructions: INR-2.6 Continue on normal schedule . Take 1.5 tablets on Monday and take1 tablet on all other days of the week. Return in 4 weeks.

## 2010-08-28 NOTE — Progress Notes (Signed)
Summary: copy of visit notes   Phone Note Call from Patient Call back at Home Phone [phone]   Caller: Patient Reason for Call: Talk to Nurse Summary of Call: pt needs a copy of [facility] [phone].  Initial call taken by: Roe Coombs,  July 09, 2010 4:12 PM      LOV faxed form march, faxed to [phone].Marland Kitchenok'd per Carrington Clamp Mesiemore  July 10, 2010 4:32 PM

## 2010-08-28 NOTE — Medication Information (Signed)
Summary: rov/ewj  Anticoagulant Therapy  Managed by: Bethena Midget, RN, BSN Referring MD: Lewayne Bunting PCP: Judithann Sheen MD Supervising MD: Eden Emms MD, Theron Arista Indication 1: Atrial Fibrillation (ICD-427.31) Lab Used: [facility] [place] Site: [address] INR POC 2.0 INR RANGE 2 - 3  Dietary changes: no    Health status changes: no    Bleeding/hemorrhagic complications: no    Recent/future hospitalizations: no    Any changes in medication regimen? no    Recent/future dental: no  Any missed doses?: no       Is patient compliant with meds? yes       Allergies: No Known Drug Allergies  Anticoagulation Management History:      The patient is taking warfarin and comes in today for a routine follow up visit.  Negative risk factors for bleeding include an age less than 46 years old.  The bleeding index is 'low risk'.  Positive CHADS2 values include History of HTN.  Negative CHADS2 values include Age > 80 years old.  The start date was 05/06/2003.  Anticoagulation responsible Khamila Bassinger: Eden Emms MD, Theron Arista.  INR POC: 2.0.  Cuvette Lot#: [id number].  Exp: 06/2011.    Anticoagulation Management Assessment/Plan:      The patient's current anticoagulation dose is Coumadin 2 mg tabs: Take as directed by coumadin clinic..  The target INR is 2 - 3.  The next INR is due 07/29/2010.  Anticoagulation instructions were given to patient.  Results were reviewed/authorized by Bethena Midget, RN, BSN.  He was notified by Bethena Midget, RN, BSN.         Prior Anticoagulation Instructions: INR 1.9  Start taking 2 tablets on Mondays and Fridays, and 1 tablet all other days.  Recheck in 3 weeks.   Current Anticoagulation Instructions: INR 2.0 Continue taking 1 pill everyday except 2 pills on Mondays and Fridays. Recheck in 4 weeks.

## 2010-08-28 NOTE — Letter (Signed)
Summary: [facility] - Delinquent Coumadin 1  Coumadin  [address]   [place], [place] [address]   Phone: [phone]  Fax: [phone]     August 16, 2009 MRN: [id number]   JAYDN MOSCATO [address] [place], [place]  [address]   Dear Mr. Hehir,  This letter is being sent to you as a reminder that it is necessary for you to get your INR/PT checked regularly so that we can optimize your care.  Our records indicate that you were scheduled to have a test done recently.  As of today, we have not received the results of this test.  It is very important that you have your INR checked.  Please call our office at the number listed above to schedule an appointment at your earliest convenience.    If you have recently had your protime checked or have discontinued this medication, please contact our office at the above phone number to clarify this issue.  Thank you for this prompt attention to this important health care matter.  Sincerely,   [facility] Team    Appended Document: [facility] - Delinquent Coumadin 1 Attempted to contact pt to r/s missed coumadin f/u appt.  LMOM TCB to schedule f/u last coumadin check 11/10.  Mailed letter to pt's home address as well.  EWJ

## 2010-08-28 NOTE — Cardiovascular Report (Signed)
Summary: TTM   TTM   Imported By: Roderic Ovens 02/04/2010 10:38:15  _____________________________________________________________________  External Attachment:    Type:   Image     Comment:   External Document

## 2010-08-28 NOTE — Cardiovascular Report (Signed)
Summary: Office Visit   Office Visit   Imported By: Roderic Ovens 10/11/2009 16:20:11  _____________________________________________________________________  External Attachment:    Type:   Image     Comment:   External Document

## 2010-08-28 NOTE — Cardiovascular Report (Signed)
Summary: TTM   TTM   Imported By: Roderic Ovens 09/26/2009 16:12:03  _____________________________________________________________________  External Attachment:    Type:   Image     Comment:   External Document

## 2010-08-28 NOTE — Medication Information (Signed)
Summary: rov/tm  Anticoagulant Therapy  Managed by: Cloyde Reams, RN, BSN Referring MD: Lewayne Bunting PCP: Judithann Sheen MD Supervising MD: Jens Som MD, Arlys John Indication 1: Atrial Fibrillation (ICD-427.31) Lab Used: [facility] [place] Site: [address] INR POC 2.4 INR RANGE 2 - 3  Dietary changes: no    Health status changes: no    Bleeding/hemorrhagic complications: no    Recent/future hospitalizations: no    Any changes in medication regimen? no    Recent/future dental: no  Any missed doses?: yes     Details: Ran out of Coumadin yesterday, needs refill  Is patient compliant with meds? yes       Allergies: No Known Drug Allergies  Anticoagulation Management History:      The patient is taking warfarin and comes in today for a routine follow up visit.  Negative risk factors for bleeding include an age less than 19 years old.  The bleeding index is 'low risk'.  Positive CHADS2 values include History of HTN.  Negative CHADS2 values include Age > 109 years old.  The start date was 05/06/2003.  Anticoagulation responsible provider: Jens Som MD, Arlys John.  INR POC: 2.4.  Cuvette Lot#: [id number].  Exp: 08/2011.    Anticoagulation Management Assessment/Plan:      The patient's current anticoagulation dose is Coumadin 2 mg tabs: Take as directed by coumadin clinic..  The target INR is 2 - 3.  The next INR is due 08/28/2010.  Anticoagulation instructions were given to patient.  Results were reviewed/authorized by Cloyde Reams, RN, BSN.  He was notified by Cloyde Reams RN.         Prior Anticoagulation Instructions: INR 2.0 Continue taking 1 pill everyday except 2 pills on Mondays and Fridays. Recheck in 4 weeks.   Current Anticoagulation Instructions: INR 2.4  Continue on same dosage 1 tablet daily except 2 tablets on Mondays and Fridays.   Recheck in 4 weeks.   Prescriptions: COUMADIN 2 MG TABS (WARFARIN SODIUM) Take as directed by coumadin clinic.  #40 x  2   Entered by:   Cloyde Reams RN   Authorized by:   Laren Boom, MD, [facility]   Signed by:   Cloyde Reams RN on 07/29/2010   Method used:   Electronically to        [facility]. [phone]* (retail)       [address]       [place], [place]  [address]       Ph: [phone]       Fax: [phone]   RxID:   [id number]

## 2010-08-28 NOTE — Medication Information (Signed)
Summary: rov/ewj  Anticoagulant Therapy  Managed by: Weston Brass, PharmD Referring MD: Lewayne Bunting PCP: Judithann Sheen MD Supervising MD: Clifton James MD, Cristal Deer Indication 1: Atrial Fibrillation (ICD-427.31) Lab Used: [facility] [place] Site: [address] INR POC 2.7 INR RANGE 2 - 3  Dietary changes: no    Health status changes: no    Bleeding/hemorrhagic complications: no    Recent/future hospitalizations: no    Any changes in medication regimen? no    Recent/future dental: no  Any missed doses?: no       Is patient compliant with meds? yes       Allergies: No Known Drug Allergies  Anticoagulation Management History:      The patient is taking warfarin and comes in today for a routine follow up visit.  Negative risk factors for bleeding include an age less than 92 years old.  The bleeding index is 'low risk'.  Positive CHADS2 values include History of HTN.  Negative CHADS2 values include Age > 41 years old.  The start date was 05/06/2003.  Anticoagulation responsible provider: Clifton James MD, Cristal Deer.  INR POC: 2.7.  Cuvette Lot#: [id number].  Exp: 11/2010.    Anticoagulation Management Assessment/Plan:      The patient's current anticoagulation dose is Coumadin 2 mg tabs: Take as directed by coumadin clinic..  The target INR is 2 - 3.  The next INR is due 11/25/2009.  Anticoagulation instructions were given to patient.  Results were reviewed/authorized by Weston Brass, PharmD.  He was notified by Weston Brass PharmD.         Prior Anticoagulation Instructions: INR 2.3  Continue on same dosage 2mg  daily except 3mg  on Mondays.  Recheck in 4 weeks.    Current Anticoagulation Instructions: INR 2.7  Continue same dose of 1 tablet every day except 1 1/2 tablet on Monday

## 2010-08-28 NOTE — Medication Information (Signed)
Summary: rov/ewj  Anticoagulant Therapy  Managed by: Cloyde Reams, RN, BSN Referring MD: Lewayne Bunting PCP: Judithann Sheen MD Supervising MD: Clifton James MD, Cristal Deer Indication 1: Atrial Fibrillation (ICD-427.31) Lab Used: [facility] [place] Site: [address] INR POC 1.9 INR RANGE 2 - 3  Dietary changes: no    Health status changes: no    Bleeding/hemorrhagic complications: no    Recent/future hospitalizations: no    Any changes in medication regimen? no    Recent/future dental: no  Any missed doses?: no       Is patient compliant with meds? yes      Comments: Pt requested f/u appt at 1st of December d/t financial constraints.   Allergies: No Known Drug Allergies  Anticoagulation Management History:      The patient is taking warfarin and comes in today for a routine follow up visit.  Negative risk factors for bleeding include an age less than 1 years old.  The bleeding index is 'low risk'.  Positive CHADS2 values include History of HTN.  Negative CHADS2 values include Age > 46 years old.  The start date was 05/06/2003.  Anticoagulation responsible Sahej Hauswirth: Clifton James MD, Cristal Deer.  INR POC: 1.9.  Cuvette Lot#: [id number].  Exp: 05/2011.    Anticoagulation Management Assessment/Plan:      The patient's current anticoagulation dose is Coumadin 2 mg tabs: Take as directed by coumadin clinic..  The target INR is 2 - 3.  The next INR is due 06/26/2010.  Anticoagulation instructions were given to patient.  Results were reviewed/authorized by Cloyde Reams, RN, BSN.  He was notified by Cloyde Reams RN.         Prior Anticoagulation Instructions: INR 1.8  Take an extra tablet today, then resume same dosage 1 tablet daily except 2 tablets on Mondays.  Recheck in 3 weeks.    Current Anticoagulation Instructions: INR 1.9  Start taking 2 tablets on Mondays and Fridays, and 1 tablet all other days.  Recheck in 3 weeks.

## 2010-08-28 NOTE — Medication Information (Signed)
Summary: rov/tm  Anticoagulant Therapy  Managed by: Cloyde Reams, RN, BSN Referring MD: Lewayne Bunting Supervising MD: Jens Som MD, Arlys John Indication 1: Atrial Fibrillation (ICD-427.31) Lab Used: [facility] [place] Site: [address] INR POC 2.3 INR RANGE 2 - 3  Dietary changes: yes       Details: Incr amt of vit K intake per Tiffany's instructions.    Health status changes: no    Bleeding/hemorrhagic complications: no    Recent/future hospitalizations: no    Any changes in medication regimen? no    Recent/future dental: no  Any missed doses?: no       Is patient compliant with meds? yes       Allergies (verified): No Known Drug Allergies  Anticoagulation Management History:      The patient is taking warfarin and comes in today for a routine follow up visit.  Negative risk factors for bleeding include an age less than 73 years old.  The bleeding index is 'low risk'.  Positive CHADS2 values include History of HTN.  Negative CHADS2 values include Age > 56 years old.  The start date was 05/06/2003.  Anticoagulation responsible provider: Jens Som MD, Arlys John.  INR POC: 2.3.  Cuvette Lot#: [id number].  Exp: 11/2010.    Anticoagulation Management Assessment/Plan:      The patient's current anticoagulation dose is Coumadin 2 mg tabs: Take as directed by coumadin clinic..  The target INR is 2 - 3.  The next INR is due 10/18/2009.  Anticoagulation instructions were given to patient.  Results were reviewed/authorized by Cloyde Reams, RN, BSN.  He was notified by Cloyde Reams RN.         Prior Anticoagulation Instructions: INR 3.1 Tomorrow skip coumadin  then resume 2mg s everyday except 3mg s on Mondays. Recheck in 3 weeks.   Current Anticoagulation Instructions: INR 2.3  Continue on same dosage 2mg  daily except 3mg  on Mondays.  Recheck in 4 weeks.

## 2010-09-03 ENCOUNTER — Encounter: Payer: Self-pay | Admitting: Internal Medicine

## 2010-09-03 ENCOUNTER — Encounter (INDEPENDENT_AMBULATORY_CARE_PROVIDER_SITE_OTHER): Payer: PRIVATE HEALTH INSURANCE

## 2010-09-03 DIAGNOSIS — I4891 Unspecified atrial fibrillation: Secondary | ICD-10-CM

## 2010-09-03 DIAGNOSIS — Z7901 Long term (current) use of anticoagulants: Secondary | ICD-10-CM

## 2010-09-03 LAB — CONVERTED CEMR LAB: POC INR: 2.8

## 2010-09-04 DIAGNOSIS — I4891 Unspecified atrial fibrillation: Secondary | ICD-10-CM

## 2010-09-11 NOTE — Medication Information (Signed)
Summary: Coumadin Clinic   Anticoagulant Therapy  Managed by: Windell Hummingbird, RN Referring MD: Lewayne Bunting PCP: Judithann Sheen MD Supervising MD: Graciela Husbands MD, Viviann Spare Indication 1: Atrial Fibrillation (ICD-427.31) Lab Used: [facility]  Site: [address] INR POC 2.8 INR RANGE 2 - 3  Dietary changes: no    Health status changes: no    Bleeding/hemorrhagic complications: no    Recent/future hospitalizations: no    Any changes in medication regimen? no    Recent/future dental: no  Any missed doses?: no       Is patient compliant with meds? yes       Allergies: No Known Drug Allergies  Anticoagulation Management History:      Negative risk factors for bleeding include an age less than 55 years old.  The bleeding index is 'low risk'.  Positive CHADS2 values include History of HTN.  Negative CHADS2 values include Age > 23 years old.  The start date was 05/06/2003.  Anticoagulation responsible provider: Graciela Husbands MD, Viviann Spare.  INR POC: 2.8.  Exp: 08/2011.    Anticoagulation Management Assessment/Plan:      The patient's current anticoagulation dose is Coumadin 2 mg tabs: Take as directed by coumadin clinic..  The target INR is 2 - 3.  The next INR is due 10/01/2010.  Anticoagulation instructions were given to patient.  Results were reviewed/authorized by Windell Hummingbird, RN.  He was notified by Windell Hummingbird, RN.         Prior Anticoagulation Instructions: INR 2.4  Continue on same dosage 1 tablet daily except 2 tablets on Mondays and Fridays.   Recheck in 4 weeks.    Current Anticoagulation Instructions: INR 2.8  Continue taking 1 tablet every day, except take 2 tablets on Mondays and Fridays. Recheck in 4 weeks.

## 2010-09-17 NOTE — Cardiovascular Report (Signed)
Summary: TTM   TTM   Imported By: Roderic Ovens 09/10/2010 14:57:39  _____________________________________________________________________  External Attachment:    Type:   Image     Comment:   External Document

## 2010-09-29 ENCOUNTER — Encounter: Payer: Self-pay | Admitting: Internal Medicine

## 2010-09-29 ENCOUNTER — Encounter (INDEPENDENT_AMBULATORY_CARE_PROVIDER_SITE_OTHER): Payer: PRIVATE HEALTH INSURANCE

## 2010-09-29 DIAGNOSIS — I495 Sick sinus syndrome: Secondary | ICD-10-CM

## 2010-10-01 ENCOUNTER — Encounter: Payer: Self-pay | Admitting: Internal Medicine

## 2010-10-01 ENCOUNTER — Encounter (INDEPENDENT_AMBULATORY_CARE_PROVIDER_SITE_OTHER): Payer: PRIVATE HEALTH INSURANCE

## 2010-10-01 DIAGNOSIS — Z7901 Long term (current) use of anticoagulants: Secondary | ICD-10-CM

## 2010-10-01 DIAGNOSIS — I4891 Unspecified atrial fibrillation: Secondary | ICD-10-CM

## 2010-10-01 LAB — CONVERTED CEMR LAB: POC INR: 2.2

## 2010-10-07 NOTE — Cardiovascular Report (Signed)
Summary: Office Visit    Office Visit   Imported By: Roderic Ovens 09/30/2010 16:20:59  _____________________________________________________________________  External Attachment:    Type:   Image     Comment:   External Document

## 2010-10-07 NOTE — Medication Information (Signed)
Summary: rov/pc  Anticoagulant Therapy  Managed by: Windell Hummingbird, RN Referring MD: Lewayne Bunting PCP: Judithann Sheen MD Supervising MD: Graciela Husbands MD, Viviann Spare Indication 1: Atrial Fibrillation (ICD-427.31) Lab Used: [facility] [place] Site: [address] INR POC 2.2 INR RANGE 2 - 3  Dietary changes: no    Health status changes: no    Bleeding/hemorrhagic complications: no    Recent/future hospitalizations: no    Any changes in medication regimen? no    Recent/future dental: no  Any missed doses?: no       Is patient compliant with meds? yes       Allergies: No Known Drug Allergies  Anticoagulation Management History:      The patient is taking warfarin and comes in today for a routine follow up visit.  Positive risk factors for bleeding include an age of 65 years or older.  The bleeding index is 'intermediate risk'.  Positive CHADS2 values include History of HTN.  Negative CHADS2 values include Age > 65 years old.  The start date was 05/06/2003.  Anticoagulation responsible provider: Graciela Husbands MD, Viviann Spare.  INR POC: 2.2.  Cuvette Lot#: [id number].  Exp: 07/2011.    Anticoagulation Management Assessment/Plan:      The patient's current anticoagulation dose is Coumadin 2 mg tabs: Take as directed by coumadin clinic..  The target INR is 2 - 3.  The next INR is due 10/29/2010.  Anticoagulation instructions were given to patient.  Results were reviewed/authorized by Windell Hummingbird, RN.  He was notified by Windell Hummingbird, RN.         Prior Anticoagulation Instructions: INR 2.8  Continue taking 1 tablet every day, except take 2 tablets on Mondays and Fridays. Recheck in 4 weeks.  Current Anticoagulation Instructions: INR 2.2 Continue taking 1 tablet every day, except take 2 tablets on Mondays and Fridays. Recheck in 4 weeks.

## 2010-10-14 NOTE — Procedures (Signed)
Summary: pacer check/[employer]/ per pt call-mj/hm/MOVE APPT TO...      Allergies Added: NKDA  Current Medications (verified): 1)  Amiodarone Hcl 200 Mg Tabs (Amiodarone Hcl) .Marland Kitchen.. 1 Tablet By Mouth Once A Day 2)  Coumadin 2 Mg Tabs (Warfarin Sodium) .... Take As Directed By Coumadin Clinic. 3)  Sertraline Hcl 100 Mg Tabs (Sertraline Hcl) .... Once Daily 4)  Abilify 30 Mg Tabs (Aripiprazole) .... 1/2 Daily 5)  Hydrochlorothiazide 25 Mg Tabs (Hydrochlorothiazide) .... Take One Tablet By Mouth Daily. 6)  Calcium 500 + D 500-125 Mg-Unit Tabs (Calcium Carbonate-Vitamin D) .... One By Mouth Two Times A Day  Allergies (verified): No Known Drug Allergies  PPM Specifications Following MD:  Lewayne Bunting, MD     PPM Vendor:  [facility] [employer]     PPM Model Number:  [id number]     PPM Serial Number:  [id number] PPM DOI:  12/11/2003      Lead 1    Location: RA     DOI: 12/11/2003     Model #: [id number]     Serial #: [id number]     Status: active Lead 2    Location: RV     DOI: 12/11/2003     Model #: [id number]     Serial #: [id number]     Status: active  Magnet Response Rate:  BOL 100 ERI 85  Indications:  Tachy-brady syndrome  Explantation Comments:  TTM'S /MEDNET  PPM Follow Up Pacer Dependent:  No      Episodes Coumadin:  Yes  Parameters Mode:  DDIR     Lower Rate Limit:  60     Upper Rate Limit:  140 Paced AV Delay:  260     Tech Comments:  see paceart report.

## 2010-10-29 ENCOUNTER — Ambulatory Visit (INDEPENDENT_AMBULATORY_CARE_PROVIDER_SITE_OTHER): Payer: PRIVATE HEALTH INSURANCE | Admitting: *Deleted

## 2010-10-29 DIAGNOSIS — I4891 Unspecified atrial fibrillation: Secondary | ICD-10-CM

## 2010-10-29 DIAGNOSIS — Z7901 Long term (current) use of anticoagulants: Secondary | ICD-10-CM | POA: Insufficient documentation

## 2010-10-29 LAB — POCT INR: INR: 3

## 2010-11-07 ENCOUNTER — Other Ambulatory Visit: Payer: Self-pay | Admitting: *Deleted

## 2010-11-07 MED ORDER — WARFARIN SODIUM 2 MG PO TABS: 2.0000 mg | ORAL_TABLET | ORAL | Status: DC

## 2010-11-26 ENCOUNTER — Ambulatory Visit (INDEPENDENT_AMBULATORY_CARE_PROVIDER_SITE_OTHER): Payer: PRIVATE HEALTH INSURANCE | Admitting: *Deleted

## 2010-11-26 DIAGNOSIS — I4891 Unspecified atrial fibrillation: Secondary | ICD-10-CM

## 2010-11-26 LAB — POCT INR: INR: 2.2

## 2010-12-09 NOTE — Assessment & Plan Note (Signed)
[facility]                         ELECTROPHYSIOLOGY OFFICE NOTE   NAME:BAILEYLenon, Kuennen                     MRN:          [id number]  DATE:02/08/2007                            DOB:          [birth year]    Mr. Feggins returns today in followup.  He is a very pleasant, middle-age  man with dramatic tachycardia-bradycardia syndrome, paroxysmal atrial  fibrillation as well as atrial flutter, who had been maintained in sinus  rhythm very nicely until recently on amiodarone.  The patient returns  today for followup and have not seen him in over a year and one-half.  He denies palpitations and denies chest pain.  He is otherwise stable.  He denies shortness of breath.   PHYSICAL EXAMINATION:  GENERAL:  He is a  pleasant, middle-age man in no  acute distress.  VITAL SIGNS: Blood pressure 112/78, pulse 90 and regular, respirations  18.  Weight was 186 pounds.  NECK:  Revealed no jugular venous distention.  LUNGS:  Clear bilaterally to auscultation.  No wheezes, rales, or  rhonchi are present.  CARDIOVASCULAR:  Regular rate and rhythm with normal S1 and S2.  Heart  sounds are distant. The PMI was not enlarged or laterally displaced.  EXTREMITIES:  Demonstrated no edema.   EKG today demonstrates atrial flutter (atypical) with a controlled  ventricular response.   Interrogation of his pacemaker demonstrates a Guidant Insignia with P  waves of 1.7 (flutter) and R waves of 4. Impedance 580 in the atrium,  910 in the ventricle. Threshold 1.2 at 0.4 in the right ventricle.  He  was 98% A paced, 93% V paced.   IMPRESSION:  1. Atrial flutter.  2. Underlying sinus bradycardia.  3. Paroxysmal atrial fibrillation.   DISCUSSION:  Overall, Mr. Salinger is stable.  He is asymptomatic despite  his being out of rhythm.  I have recommended a period of watchful  waiting.  We will continue him amiodarone but decrease his dose to 200  mg a day and will have him obtain  liver panel and TSH as these have not  been obtained in over a year while on amiodarone as he has missed  followup.     Doylene Canning. Ladona Ridgel, MD  Electronically Signed    GWT/MedQ  DD: 02/08/2007  DT: 02/09/2007  Job #: [id number]

## 2010-12-09 NOTE — Assessment & Plan Note (Signed)
[facility]                         ELECTROPHYSIOLOGY OFFICE NOTE   NAME:BAILEYCharleton, Deyoung                     MRN:          [id number]  DATE:08/30/2008                            DOB:          03-20-1946    Phillip Kline returns today for followup.  He is a very pleasant middle-  aged male with symptomatic tachy-brady syndrome and paroxysmal AFib who  has maintained sinus rhythm very nicely on amiodarone therapy.  The  patient also has dyslipidemia has been on Pravachol for this.  He denies  chest pain or shortness of breath.  He really has done quite well with  regard to symptoms of AFib or bradycardia since his pacemaker was placed  and amiodarone was initiated.  On followup today, his medications  include warfarin 2 mg as directed, amiodarone 200 a day, Pravachol 80 a  day, and aripiprazole 30 mg half tablet daily.   PHYSICAL EXAMINATION:  GENERAL:  He is a pleasant, well-appearing middle-  aged man in no distress.  VITAL SIGNS:  Blood pressure today was 140/84, the pulse 60 and regular,  the respirations were 18, the weight was 184 pounds.  NECK:  Revealed no jugular venous distention.  LUNGS:  Clear bilaterally to auscultation.  No wheezes, rales, or  rhonchi present.  No increased work of breathing.  CARDIOVASCULAR:  Regular rate and rhythm.  Normal S1 and S2.  ABDOMEN:  Soft, nontender.  EXTREMITIES:  Demonstrate no edema.   Interrogation of his pacemaker demonstrates Guidant Weldon.  P-waves  were 2 and R-waves were 6.  The impedance 530 in the A, 910 in the V.  The thresholds 1.4 in the A and 0.8, 0.4 in the RV.  The battery voltage  was MOL 1.   IMPRESSION:  1. Symptomatic tachy-brady syndrome.  2. Paroxysmal atrial fibrillation.  3. Status post pacemaker insertion.  4. Dyslipidemia on statin therapy.   DISCUSSION:  Overall, Mr. Estis is stable.  His atrial fibrillation is  well controlled with amiodarone.  His dyslipidemia is  controlled on  Pravachol.  His anticoagulation has been stable on warfarin.  I will see  the patient back for pacemaker followup which is by the way working  normally in 1 year.  We will plan on obtaining a liver panel and TSH in  the next couple weeks.     Doylene Canning. Ladona Ridgel, MD  Electronically Signed    GWT/MedQ  DD: 08/30/2008  DT: 08/31/2008  Job #: [id number]

## 2010-12-12 NOTE — Assessment & Plan Note (Signed)
[facility]                         GASTROENTEROLOGY OFFICE NOTE   NAME:Phillip Kline, Phillip Kline                     MRN:          [id number]  DATE:07/28/2006                            DOB:          [birth year]    The patient comes in referred by Dr. Scotty Court for colonoscopy.   The patient has a pacemaker. He has had polyps of the colon in the past  and was referred for follow-up examination.   FAMILY HISTORY:  Noncontributory.   PAST MEDICAL HISTORY:  1. Hypertension.  2. Cardiac arrhythmias.  3. Arthritis.  4. Anxiety depression.   SOCIAL HISTORY:  Noncontributory except for occasional alcohol.   REVIEW OF SYSTEMS:  Noncontributory.   PHYSICAL EXAMINATION:  VITAL SIGNS: Height 5 feet, 4 inches. Weight 180.  Blood pressure 140/78, pulse 60 and regular.  HEENT: EOMI. PERRLA. Sclerae are anicteric.  Conjunctivae are pink.  NECK:  Supple without thyromegaly, adenopathy or carotid bruits.  CHEST:  Clear to auscultation and percussion without adventitious  sounds.  CARDIAC:  Regular rhythm; normal S1 S2.  There are no murmurs, gallops  or rubs.  ABDOMEN: Bowel sounds are normoactive.  Abdomen is soft, non-tender and  non-distended.  There are no abdominal masses, tenderness, splenic  enlargement or hepatomegaly.  EXTREMITIES: Full range of motion.  No cyanosis, clubbing or edema.  RECTAL:  There are no masses.  Stool is Hemoccult negative.   IMPRESSION:  Status post colon polyps.  1. Arteriosclerotic cardiovascular disease.  2. Cardiac arrhythmias.  3. Anxiety depression.  4. Arthritis.  5. Hypertension.   RECOMMENDATION:  Schedule for colonoscopic examination. We are going to  contact his cardiologist about his Coumadin to determine if he can be on  or off Coumadin.     Ulyess Mort, MD  Electronically Signed    SML/MedQ  DD: 07/28/2006  DT: 07/28/2006  Job #: [id number]   cc:   Ellin Saba., MD

## 2010-12-12 NOTE — Consult Note (Signed)
NAME:  Phillip Kline, Phillip Kline NO.:  [id number]   MEDICAL RECORD NO.:  [id number]                   PATIENT TYPE:  INP   LOCATION:  0477                                 FACILITY:  [facility]   PHYSICIAN:  Dionne Ano. Everlene Other, M.D.         DATE OF BIRTH:  [birth year]   DATE OF CONSULTATION:  09/30/2002  DATE OF DISCHARGE:                                   CONSULTATION   HISTORY OF PRESENT ILLNESS:  I had the pleasure to see the patient at [facility] September 30, 2002, in regards to his upper extremity  predicament.  I was asked to see him by Dr. Thomasena Edis.  I briefly reviewed his  exam yesterday and have set down with him today at his bedside and discussed  with him his upper extremity predicament at length.  The patient is 65 years  old.  He has had prior left thumb replacement by myself and has had right  thumb surgery as well in the past.  He presents due to complaints about  swelling over the right dorsal hand region.  The swelling has been present  for the last two to five days.  The patient states that three to four weeks  ago he was bitten by a cat, saw Dr. Scotty Court, but did not have antibiotics  since at that time there was no obvious infection.  Since that time he has  developed this undulant infection.  He was seen yesterday at [facility].  I saw him as well as Dr. Thomasena Edis.  I felt that this  patient primarily had a cellulitis with superimposed tenosynovitis, possibly  infectious in nature.  At this point in time, we recommended admission to  the hospital and placed him on Unasyn.  I discussed this with Dr. Thomasena Edis.  Since that time the patient has been on Unasyn and in an elevated position.  We have obtained infectious disease consult with Dr. Cliffton Asters.   PAST MEDICAL HISTORY/SURGICAL HISTORY:  Histories have been re-reviewed.  Allergies and medicines have been reviewed as well.   SOCIAL HISTORY:  The patient is  retired.  He is a former post [employer]or, [employer].   PHYSICAL EXAMINATION:  VITAL SIGNS:  Stable.  He is afebrile.  NECK AND BACK:  Normal.  SHOULDER AND ELBOW:  Benign.  Specifically there is no axillary lymph nodes  or epitrochlea lymph nodes about the axilla and elbow region medially  respectively.  He has no signs of ascending cellulitis.  The cellulitic  pattern noted yesterday has resolved significantly over the dorsal hand.  However, he still has a significant amount of dorsal tenosynovitis.  This is  not particularly tender but it is certainly prominent and fairly unchanged  in terms of the amount/volume of tenosynovitic substance.  I have reviewed  this at length today.  I should note he has normal FDS, FDP, and extensor  flexion.  There is no signs of compartment syndrome, acute carpal tunnel  syndrome, or frank dystrophic reaction at present time.   IMPRESSION:  Cellulitis, right upper extremity, with superimposed  tenosynovitis, rule out undulant infectious tenosynovitis.   PLAN:  At present time we are going to continue the Indocin.  His cellulitic  pattern has begun to recede nicely.  I do think that the cellulitis should  likely resolve.  However, I have significant concerns in regards to the  dorsal tenosynovitis.  Longstanding tenosynovitis can erode the tendon  architecture and cause attritional or frank tears in the tendon apparatus  making the tendon apparatus incompetent.  Thus, a process such as this does  not want to go on unchecked indefinitely.  Given the fact that this may be  an infectious tenosynovitic circumstance, aspiration is a consideration.  However, I would not inject corticosteroid into the area which we typically  do with regular tenosynovitic masses initially.  Thus, our plan at present  time is to watch this very closely and allow the cellulitic  area to recede.  Following this, aspiration and/or formal dorsal  tenosynovectomy can be performed.  I  have discussed with the patient these  issues, dos and don'ts, etc., and all questions were encouraged and  answered.   It has been a pleasure to see the patient.  I know him quite well from  previous surgical intervention.                                               Dionne Ano. Everlene Other, M.D.    Nash Mantis  D:  09/30/2002  T:  09/30/2002  Job:  [id number]   cc:   Cliffton Asters, M.D.  [address] [place]  [place] [address]  Fax: [phone]   Hayden Rasmussen, M.D.  **send in regards to [facility]

## 2010-12-12 NOTE — Discharge Summary (Signed)
NAME:  Phillip Kline, MILLIKAN NO.:  [id number]   MEDICAL RECORD NO.:  [id number]                   PATIENT TYPE:  INP   LOCATION:  0477                                 FACILITY:  [facility]   PHYSICIAN:  Erasmo Leventhal, M.D.         DATE OF BIRTH:  [birth year]   DATE OF ADMISSION:  09/29/2002  DATE OF DISCHARGE:  10/02/2002                                 DISCHARGE SUMMARY   ADMISSION DIAGNOSIS:  Cellulitis and tenosynovitis, dorsal right wrist.   DISCHARGE DIAGNOSIS:  Cellulitis and tenosynovitis, dorsal right wrist.   OPERATION:  None.   BRIEF HISTORY:  This 65 year old gentleman with a history of a cat bite a  month prior to admission. Seen by outside primary care physician, given a  tetanus shot and prescription to take if it got worse but he said it seemed  to get better and he took no other medication. Over the last month, he has  slowly had increased swelling over his wrist and has now developed a  cellulitis in addition to swelling over the dorsum of his wrist. He had a  temperature of 100.1 on the day of admission. He was subsequently seen and  evaluated in the office and subsequently admitted for IV antibiotic therapy  and possible I&D of his wound.   LABORATORY DATA:  CBC within normal limits with the exception of slightly  high monos at 13. Repeat CBC on the 8th showed RBC low at 3.83, hematocrit  low at 38.1 with MCV at 99.4. Admission CMET showed his glucose high at 165,  otherwise, normal. Synovial fluid cell count showed 15,000 WBCs with 38  lymphs, 23 neutrophils, and 37 macrophages. Urinalysis showed trace  hemoglobin, otherwise, normal. Cultures showed no growth and no crystals  initially, no acid fast bacilli, no yeast or fungal elements.   HOSPITAL COURSE:  The patient was admitted to the hospital and followed. On  admission, his blood pressure was noted to be elevated 160/100. His medical  physician was Dr. Milus Glazier. His office  was called and notified and I will  follow with him if his pressure stays up. An infectious disease consult was  obtained and he was started on empiric IV Unasyn for Pasturella. The first  post admission day, his cellulitis was decreasing nicely, there were  epitrochlear or axillary nodes noted. Elevation was obtained and as the  patient was responding well no surgical intervention was planned acutely.  The third post admission day his cellulitis had resolved with no heat or  warmth in the hand, dorsal tenosynovitis remains. Aspiration was obtained  and cultures were sent as were Gram stains. The patient was also seen by  family medicine due to his elevated blood pressures. Followup on an  outpatient basis was scheduled as well as cardiac stress test and repeat  fasting blood sugars. The patient continued to do well with his hand with  WBCs normal, vital signs stable, afebrile  and improved swelling and redness.  The patient was subsequently scheduled on the fourth post admission day for  discharge home on p.o. Augmentin and close outpatient followup.   CONDITION ON DISCHARGE:  Improved.   DISCHARGE MEDICATIONS:  1. Augmentin 875, 1 p.o. b.i.d.  2. Vicodin 1, q. 4-6h p.r.n. pain.  3. Vioxx 25 mg, 1 daily.    DISCHARGE INSTRUCTIONS:  He was instructed to keep the hand clean and dry,  keep the hand elevated and to call if swelling, redness or pain increases.  Followup in the office on Wednesday.     Jaquelyn Bitter. Chabon, P.A.                   Erasmo Leventhal, M.D.    SJC/MEDQ  D:  10/20/2002  T:  10/21/2002  Job:  [id number]
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
## 2010-12-12 NOTE — Op Note (Signed)
[place]  Patient:    Phillip Kline, Phillip Kline                    MRN: [id number] Proc. Date: 10/06/99 Attending:  Zigmund Daniel, M.D.                           Operative Report  PREOPERATIVE DIAGNOSIS: 1. Epigastric hernia. 2. Right inguinal hernia.  POSTOPERATIVE DIAGNOSIS: 1. Epigastric hernia. 2. Right inguinal hernia.  OPERATION PERFORMED: 1. Repair of epigastric hernia. 2. Repair of right inguinal hernia.  SURGEON:  Zigmund Daniel, M.D.  ASSISTANT:  ANESTHESIA:  General.  DESCRIPTION OF PROCEDURE:  After the patient was adequately monitored and well anesthetized and after routine preparation and draping of the abdomen and inguinal region, I made a midline incision just above the umbilicus about 4 cm long and dissected down through the fat until I identified the hernia which appeared to e preperitoneal fat bulging through a midline defect.  I freed it up from the surrounding normal fat and dissected it down to the fascia.  I found a second small defect immediately adjacent to it toward the left and I cut the fascia in between so that they could be reduced as one hernia.  However, because of the bulk of the preperitoneal fat, I was not able to reduce it, so I amputated it with the cautery and obtained good hemostasis.  As I freed up the fascia inferiorly, I encountered a third defect in the midline which was an epigastric hernia rather than an umbilical hernia.  I connected that defect to the other two and reduced the contents.  I noticed at that point there was a small hole in the peritoneum.  I therefore decided I would repair the hernia primarily and reinforce it with mesh external to the fascia.  I freed up the fascia for approximately 2 to 3 cm in all directions, then closed the defect with a running 0 Prolene.  I laid a patch of polypropylene mesh over the repair going past the ends for at least 1 cm and about 1.5  cm to cm above and below with an elliptical piece of mesh.  I sewed it in with a running  basting 2-0 Prolene suture and I felt this was a quite secure repair. Hemostasis was good.  I thoroughly anesthetized the area with 0.5% Marcaine with epinephrine. I closed the subcutaneous tissues down to the defect and to themselves with 4-0  Vicryl and closed the skin with running intercuticular 4-0 Vicryl and Steri-Strips. I then made an oblique skin incision beginning just above and lateral to the pubic tubercle on the right side and dissected down to the external oblique which I opened in the direction of its fibers into the external ring.  I mobilized the spermatic cord, taking care to avoid injury to the ilioinguinal and iliohypogastric nerves.  The cord was thin.  I dissected it proximally and found that there was no hernia present.  There was a large direct weakness and defect with a direct inguinal hernia present.  I separated that from the cord and reduced it and held it in with plug of polypropylene mesh held in place with a running 2-0 silk stitch.  I fashioned a patch of polypropylene mesh with a slit made in it for exit of the spermatic cord and sewed it in from the pubic tubercle laterally and inferiorly  along  the inguinal ligament and superiorly and medially in the internal oblique  fascia.  I used one stitch to join the tails of the slit mesh together lateral o the cord and internal ring.  This appeared to provide a secure repair.  Again,  thoroughly anesthetized the incision with 0.5% Marcaine with epinephrine. Sponge, needle and instrument counts were correct.  I closed the external oblique with  running 3-0 Vicryl, the subcutaneous tissues with running 3-0 Vicryl and the skin with intercuticular 4-0 Vicryl and Steri-Strips.  The patient tolerated the procedure well. DD:  10/06/99 TD:  10/06/99 Job: [id number]

## 2010-12-12 NOTE — Op Note (Signed)
NAME:  Phillip Kline, Phillip Kline NO.:  [id number]   MEDICAL RECORD NO.:  [id number]                   PATIENT TYPE:  OIB   LOCATION:  2899                                 FACILITY:  [facility]   PHYSICIAN:  Doylene Canning. Ladona Ridgel, M.D.               DATE OF BIRTH:  04/12/1946   DATE OF PROCEDURE:  12/11/2003  DATE OF DISCHARGE:                                 OPERATIVE REPORT   PROCEDURE PERFORMED:  Insertion of a dual chamber pacemaker.   INDICATIONS FOR PROCEDURE:  Symptomatic tachy-brady syndrome.   INTRODUCTION:  The patient is a 65 year old male with a history of  paroxysmal atrial fibrillation.  He has had recurrent dizzy spells and was  found to have long post termination pauses which were symptomatic while he  came out of atrial fibrillation and flutter.  At baseline he has sinus  bradycardia with heart rates in the high 40s and was begun on amiodarone to  maintain him in sinus rhythm and has maintained heart rates in the low 40s  now with decreasing energy and increasing fatigue.  He is now referred for  permanent pacemaker insertion.   DESCRIPTION OF PROCEDURE:  After informed consent was obtained, the patient  was taken to the diagnostic electrophysiology laboratory in a fasting state.  After the usual preparation and draping, intravenous fentanyl and Midazolam  were given for sedation.  A total of 30 mL of lidocaine was infiltrated into  the left infraclavicular region. A 5 cm incision was carried out over this  region and electrocautery utilized to dissect down to the fascial plane.  10  mL of contrast injected into the left upper extremity venous system  demonstrating a patent left subclavian vein.  It was subsequently punctured  times two and a Guidant model [id number] active fixation bipolar pacing  lead was advanced into the right atrium and the Guidant model [id number] [phone]  active fixation bipolar pacing lead was advanced into the right ventricle.  R-waves  were somewhat reduced throughout the right ventricle.  At the final  site, the R-waves measured 9 to 10 mV with a pacing threshold of 0.9 V at  0.5 msec once the lead was actively fixed.  The pacing impedance was 1053  ohms.  10 V pacing did not stimulate the diaphragm.  With the ventricular  lead in satisfactory position, attention was then turned to the atrial lead.  It was placed in  the anterior septal wall of the right atrium where the P-  waves measured 2 to 3 mV with a pacing threshold of 0.4 V at 0.5 msec once  the lead was actively fixed.  The pacing impedance was 706 ohms.  Again, 10  V pacing did not stimulate the diaphragm.  With both atrial and ventricular  leads in satisfactory position, they were secured to the subpectoralis  fascia with a figure-of-eight silk suture.  In addition the sewing sleeve  was secured with silk suture.  Electrocautery was then utilized to make a  subcutaneous pocket.  Kanamycin irrigation was utilized to irrigate the  pocket.  Electrocautery was also utilized to assure hemostasis.  With both  atrial and ventricular leads in satisfactory position, they were connected  to the Guidant Insignia 1+DR model 1297, serial number [id number] dual chamber  pacing lead and placed in the subcutaneous pocket.  The generator was  secured with a silk suture.  Additional Kanamycin was utilized to irrigate  the incision.  The incision was then closed with a layer of 2-0 Vicryl,  followed by a layer of 3-0 Vicryl followed by a layer of 4-0 Vicryl.  Benzoin was painted on the skin.  Steri-Strips were applied.  Pressure  dressing placed and the patient returned to his room in satisfactory  condition.   COMPLICATIONS:  There were no immediate procedure complications.   RESULTS:  This demonstrates successful insertion of a Guidant dual chamber  pacemaker in a patient with symptomatic tachy-brady syndrome.                                               Doylene Canning. Ladona Ridgel,  M.D.    GWT/MEDQ  D:  12/11/2003  T:  12/12/2003  Job:  [id number]   cc:   Pricilla Riffle, M.D.   Neta Mends. Fabian Sharp, M.D. [facility]

## 2010-12-12 NOTE — Discharge Summary (Signed)
NAME:  SHAINE, NEWMARK                        ACCOUNT NO.:  [id number]   MEDICAL RECORD NO.:  [id number]                   PATIENT TYPE:  OIB   LOCATION:  4734                                 FACILITY:  [facility]   PHYSICIAN:  Doylene Canning. Ladona Ridgel, M.D.               DATE OF BIRTH:  1945/09/12   DATE OF ADMISSION:  12/11/2003  DATE OF DISCHARGE:  12/12/2003                                 DISCHARGE SUMMARY   DISCHARGE DIAGNOSES:  1. Symptomatic paroxysmal atrial fibrillation.  2. Presyncope with bradycardia.  3. 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   SECONDARY DIAGNOSES:  1. Hypertension.  2. Post-traumatic stress disorder.   PROCEDURE:  On Dec 11, 2003, status post implantation of DDD pacemaker,  Guidant Garey I plus DR model 1297, serial number [id number].   DISCHARGE DISPOSITION:  Mr. Whedbee is ready for discharge post-procedure day  #1, Dec 12, 2003.  He is in a sinus rhythm, blood pressure 148/90.  He has  no complaints.  In fact, the patient's pocket is without erythema or  ecchymoses, and without bleeding or evidence of hematoma.  The patient is  not complaining of chest pain or shortness of breath.  He goes home on the  following medications:   DISCHARGE MEDICATIONS:  1. Coumadin 2.5 mg daily except 5 mg on Friday.  2. Amiodarone 200 mg b.i.d.  3. Toprol XL 25 mg daily.  4. Lipitor 40 mg daily at bedtime.  5. Seroquel 50 mg at bedtime.  6. He will use shot Tylenol 325 mg one to two times q.4-6h. for chest     discomfort.   ACTIVITY:  He is asked not to lift his arm above the head for the next seven  days according to the accompanying discharge sheet for pacemaker  implantation.  He is asked not to drive for the next 10 days.   DISCHARGE DIET:  Low salt, low cholesterol diet.   WOUND CARE:  He is to keep the incision clean and dry.  He is not to engage  in any showers for the next seven days, but he may sponge bathe until the  time that he is able to  shower.   He is to call Dr. Olga Millers office if he starts having fevers or has  unusual drainage from incision.   FOLLOWUP:  He will have a Coumadin Clinic check on Friday, Dec 14, 2003, at  10:45.  He will present to the [facility] on December 26, 2003, at 9:00 a.m.,  and will see Dr. Lewayne Bunting on March 12, 2004, at 3:50 p.m.   HISTORY:  Mr. Englert is a pleasant middle-aged man with a history of dizzy  spells.  He is followed by Dr. Tenny Craw.  He has a history of paroxysmal atrial  arrhythmias, and because of his dizzy spells was asked to wear a 48 hour  Holter monitor.  He returns on November 14, 2003, for followup.  The patient  continues to feel periods of palpitations and lightheadedness, but no frank  syncope.  He states that when his heart is out of rhythm his energy level is  reduced, but he denies any associated chest pain or dyspnea at rest.  The  plan is that he will end up having flutter ablation and pacemaker insertion,  and be started on Amiodarone.  He will follow up if he has shortness of  breath or syncope for a sooner followup.   HOSPITAL COURSE:  The patient was admitted to [facility] on Dec 11, 2003.  He was in sinus rhythm.  He does have a history of paroxysmal atrial  fibrillation with termination pauses with accompanied lightheadedness,  dizziness, and fatigue when in atrial fibrillation.  He underwent DDD dual  chamber pacemaker implantation on Dec 11, 2003.  He has had no complications  following the procedure, and will go home on after 24 hours of observation  on Dec 12, 2003.  He goes home on the medications and with the followup as  dictated.      Maple Mirza, P.A.                    Doylene Canning. Ladona Ridgel, M.D.    GM/MEDQ  D:  12/12/2003  T:  12/13/2003  Job:  [id number]   cc:   Pricilla Riffle, M.D.

## 2010-12-23 ENCOUNTER — Encounter: Payer: Self-pay | Admitting: Internal Medicine

## 2010-12-30 ENCOUNTER — Ambulatory Visit (INDEPENDENT_AMBULATORY_CARE_PROVIDER_SITE_OTHER): Payer: PRIVATE HEALTH INSURANCE | Admitting: *Deleted

## 2010-12-30 ENCOUNTER — Encounter: Payer: Self-pay | Admitting: Internal Medicine

## 2010-12-30 ENCOUNTER — Ambulatory Visit (INDEPENDENT_AMBULATORY_CARE_PROVIDER_SITE_OTHER): Payer: PRIVATE HEALTH INSURANCE | Admitting: Internal Medicine

## 2010-12-30 VITALS — BP 127/96 | HR 101 | Ht 64.0 in | Wt 160.0 lb

## 2010-12-30 DIAGNOSIS — I4892 Unspecified atrial flutter: Secondary | ICD-10-CM

## 2010-12-30 DIAGNOSIS — I495 Sick sinus syndrome: Secondary | ICD-10-CM

## 2010-12-30 DIAGNOSIS — I4891 Unspecified atrial fibrillation: Secondary | ICD-10-CM

## 2010-12-30 DIAGNOSIS — Z8679 Personal history of other diseases of the circulatory system: Secondary | ICD-10-CM

## 2010-12-30 DIAGNOSIS — Z950 Presence of cardiac pacemaker: Secondary | ICD-10-CM

## 2010-12-30 NOTE — Assessment & Plan Note (Signed)
The patient has developed recurrent atrial flutter. This is despite being on amiodarone. He has been asymptomatic. He does not feel palpitations. His ventricular rates at times been slightly elevated but other times not. The patient's atrial flutter appears to be left atrial in origin based on the EKG. I have cautioned the patient to let me know if his symptoms change. Otherwise, will undergo a period of watchful waiting

## 2010-12-30 NOTE — Assessment & Plan Note (Signed)
He remains asymptomatic. He continue his current medical therapy.

## 2010-12-30 NOTE — Patient Instructions (Signed)
Your physician recommends that you schedule a follow-up appointment in: YEAR WITH DR TAYLOR Your physician recommends that you continue on your current medications as directed. Please refer to the Current Medication list given to you today. 

## 2010-12-30 NOTE — Progress Notes (Signed)
HPI [name] returns today for followup.he is a very pleasant 65 year old man with a history of symptomatic atrial fibrillation and flutter, sinus bradycardia, status post permanent pacemaker insertion. He has a history of hypertension. The patient has lost weight over the last year and has been exercising on a regular basis. He feels well. He denies palpitations, chest pain, or shortness of breath. He denies noncompliance with medications. Not on File   Current Outpatient Prescriptions  Medication Sig Dispense Refill  . amiodarone (PACERONE) 200 MG tablet Take 200 mg by mouth daily.        . ARIPiprazole (ABILIFY) 30 MG tablet Take 15 mg by mouth daily. 1/2 tab daily       . Calcium Carbonate-Vitamin D (CALCIUM 500/D) 500-125 MG-UNIT TABS Take 1 tablet by mouth 2 (two) times daily.        . hydrochlorothiazide 25 MG tablet Take 25 mg by mouth daily.        . sertraline (ZOLOFT) 100 MG tablet Take 100 mg by mouth daily.        Marland Kitchen warfarin (COUMADIN) 2 MG tablet Take 1 tablet (2 mg total) by mouth as directed.  40 tablet  3     Past Medical History  Diagnosis Date  . Hypertension   . Hyperlipidemia   . Anxiety   . Coronary artery disease   . Atrial fibrillation   . Atrial flutter   . Sinus bradycardia     ROS:   All systems reviewed and negative except as noted in the HPI.   Past Surgical History  Procedure Date  . Hernia repair   . Cataract extraction   . Replacement total knee     right knee  . Thumb surgery     left and right  . Permanent pacemaker 2005    guidant insignia DDD     Family History  Problem Relation Age of Onset  . Cancer Father     lung cancer     History   Social History  . Marital Status: Single    Spouse Name: N/A    Number of Children: N/A  . Years of Education: N/A   Occupational History  . Not on file.   Social History Main Topics  . Smoking status: Not on file  . Smokeless tobacco: Not on file  . Alcohol Use: Yes     rarely    . Drug Use: Yes     rare marijuana   . Sexually Active:    Other Topics Concern  . Not on file   Social History Narrative  . No narrative on file     BP 127/96  Pulse 101  Ht 5\' 4"  (1.626 m)  Wt 160 lb (72.576 kg)  BMI 27.46 kg/m2  Physical Exam:  Well appearing NAD HEENT: Unremarkable Neck:  No JVD, no thyromegally Lymphatics:  No adenopathy Back:  No CVA tenderness Lungs:  Clear. Well-healed pacemaker incision. HEART:  Regular rate rhythm, no murmurs, no rubs, no clicks Abd:  Flat, positive bowel sounds, no organomegally, no rebound, no guarding Ext:  2 plus pulses, no edema, no cyanosis, no clubbing Skin:  No rashes no nodules Neuro:  CN II through XII intact, motor grossly intact  EKG Atrial flutter with a controlled/rapid ventricular response.  DEVICE  Normal device function.  See PaceArt for details.   Assess/Plan:

## 2010-12-30 NOTE — Assessment & Plan Note (Signed)
His device is working normally. We'll recheck in several months. 

## 2011-01-27 ENCOUNTER — Ambulatory Visit (INDEPENDENT_AMBULATORY_CARE_PROVIDER_SITE_OTHER): Payer: PRIVATE HEALTH INSURANCE | Admitting: *Deleted

## 2011-01-27 DIAGNOSIS — I4891 Unspecified atrial fibrillation: Secondary | ICD-10-CM

## 2011-02-17 ENCOUNTER — Encounter: Payer: PRIVATE HEALTH INSURANCE | Admitting: *Deleted

## 2011-02-27 ENCOUNTER — Ambulatory Visit (INDEPENDENT_AMBULATORY_CARE_PROVIDER_SITE_OTHER): Payer: PRIVATE HEALTH INSURANCE | Admitting: *Deleted

## 2011-02-27 DIAGNOSIS — I4891 Unspecified atrial fibrillation: Secondary | ICD-10-CM

## 2011-03-16 DIAGNOSIS — I495 Sick sinus syndrome: Secondary | ICD-10-CM

## 2011-03-20 ENCOUNTER — Encounter: Payer: PRIVATE HEALTH INSURANCE | Admitting: *Deleted

## 2011-04-01 ENCOUNTER — Ambulatory Visit (INDEPENDENT_AMBULATORY_CARE_PROVIDER_SITE_OTHER): Payer: PRIVATE HEALTH INSURANCE | Admitting: *Deleted

## 2011-04-01 DIAGNOSIS — I4891 Unspecified atrial fibrillation: Secondary | ICD-10-CM

## 2011-04-01 LAB — POCT INR: INR: 2.1

## 2011-04-27 ENCOUNTER — Other Ambulatory Visit: Payer: Self-pay | Admitting: Cardiology

## 2011-04-27 MED ORDER — WARFARIN SODIUM 2 MG PO TABS: ORAL_TABLET | ORAL | Status: DC

## 2011-04-29 ENCOUNTER — Ambulatory Visit (INDEPENDENT_AMBULATORY_CARE_PROVIDER_SITE_OTHER): Payer: PRIVATE HEALTH INSURANCE | Admitting: *Deleted

## 2011-04-29 DIAGNOSIS — I4891 Unspecified atrial fibrillation: Secondary | ICD-10-CM

## 2011-04-29 LAB — POCT INR: INR: 2

## 2011-05-07 ENCOUNTER — Other Ambulatory Visit: Payer: Self-pay

## 2011-05-07 MED ORDER — WARFARIN SODIUM 2 MG PO TABS: ORAL_TABLET | ORAL | Status: DC

## 2011-05-12 ENCOUNTER — Other Ambulatory Visit: Payer: Self-pay | Admitting: Pharmacist

## 2011-05-12 MED ORDER — WARFARIN SODIUM 2 MG PO TABS: ORAL_TABLET | ORAL | Status: DC

## 2011-05-18 ENCOUNTER — Other Ambulatory Visit: Payer: Self-pay | Admitting: *Deleted

## 2011-05-19 ENCOUNTER — Telehealth: Payer: Self-pay | Admitting: Internal Medicine

## 2011-05-19 NOTE — Telephone Encounter (Signed)
Pt calling stating that pharmacy is requesting last office visit notes before they agree to refill pt medication (amioradone) . Fax number: [phone] Attn: Silvestre Gunner   Please call pt when the office notes are faxed over.

## 2011-05-19 NOTE — Telephone Encounter (Signed)
Called patient and let him know we faxed over the note

## 2011-05-27 ENCOUNTER — Encounter: Payer: PRIVATE HEALTH INSURANCE | Admitting: *Deleted

## 2011-06-02 ENCOUNTER — Telehealth: Payer: Self-pay | Admitting: Internal Medicine

## 2011-06-02 NOTE — Telephone Encounter (Signed)
Returned call to patient he was not at home had gone to vote  He will return my call when he returns home

## 2011-06-02 NOTE — Telephone Encounter (Signed)
Pt wants to talk to you about amiodorone and getting an rx for this med He uses the [facility] on [address] please call him back

## 2011-06-03 ENCOUNTER — Telehealth: Payer: Self-pay | Admitting: Internal Medicine

## 2011-06-03 MED ORDER — AMIODARONE HCL 200 MG PO TABS: 200.0000 mg | ORAL_TABLET | Freq: Every day | ORAL | Status: DC

## 2011-06-03 NOTE — Telephone Encounter (Signed)
Will re fax to  [facility] at [facility]

## 2011-06-03 NOTE — Telephone Encounter (Signed)
Pt states he was called yesterday and is calling back.

## 2011-06-03 NOTE — Telephone Encounter (Signed)
Calling back, I have already spoken to patient earlier today.  He was not calling me back, he was calling to schedule a CVRR clinic appointment and that has been taken care of

## 2011-06-03 NOTE — Telephone Encounter (Addendum)
12/30/10 OV  Note faxed to [facility] @ [phone]  05/31/11/km

## 2011-06-04 ENCOUNTER — Ambulatory Visit (INDEPENDENT_AMBULATORY_CARE_PROVIDER_SITE_OTHER): Payer: PRIVATE HEALTH INSURANCE | Admitting: *Deleted

## 2011-06-04 DIAGNOSIS — Z7901 Long term (current) use of anticoagulants: Secondary | ICD-10-CM

## 2011-06-04 DIAGNOSIS — I4891 Unspecified atrial fibrillation: Secondary | ICD-10-CM

## 2011-06-04 LAB — POCT INR: INR: 3.2

## 2011-06-25 ENCOUNTER — Encounter: Payer: PRIVATE HEALTH INSURANCE | Admitting: *Deleted

## 2011-07-01 ENCOUNTER — Ambulatory Visit (INDEPENDENT_AMBULATORY_CARE_PROVIDER_SITE_OTHER): Payer: PRIVATE HEALTH INSURANCE | Admitting: *Deleted

## 2011-07-01 DIAGNOSIS — Z7901 Long term (current) use of anticoagulants: Secondary | ICD-10-CM

## 2011-07-01 DIAGNOSIS — I4891 Unspecified atrial fibrillation: Secondary | ICD-10-CM

## 2011-07-29 ENCOUNTER — Ambulatory Visit (INDEPENDENT_AMBULATORY_CARE_PROVIDER_SITE_OTHER): Payer: PRIVATE HEALTH INSURANCE | Admitting: *Deleted

## 2011-07-29 DIAGNOSIS — I4891 Unspecified atrial fibrillation: Secondary | ICD-10-CM

## 2011-07-29 DIAGNOSIS — Z7901 Long term (current) use of anticoagulants: Secondary | ICD-10-CM

## 2011-08-28 ENCOUNTER — Ambulatory Visit (INDEPENDENT_AMBULATORY_CARE_PROVIDER_SITE_OTHER): Payer: PRIVATE HEALTH INSURANCE | Admitting: *Deleted

## 2011-08-28 DIAGNOSIS — Z7901 Long term (current) use of anticoagulants: Secondary | ICD-10-CM

## 2011-08-28 DIAGNOSIS — I4891 Unspecified atrial fibrillation: Secondary | ICD-10-CM

## 2011-08-28 LAB — POCT INR: INR: 2.5

## 2011-09-09 ENCOUNTER — Other Ambulatory Visit: Payer: Self-pay | Admitting: *Deleted

## 2011-09-09 MED ORDER — AMIODARONE HCL 200 MG PO TABS: 200.0000 mg | ORAL_TABLET | Freq: Every day | ORAL | Status: DC

## 2011-09-22 ENCOUNTER — Encounter: Payer: Self-pay | Admitting: Internal Medicine

## 2011-09-22 DIAGNOSIS — I495 Sick sinus syndrome: Secondary | ICD-10-CM

## 2011-09-25 ENCOUNTER — Ambulatory Visit (INDEPENDENT_AMBULATORY_CARE_PROVIDER_SITE_OTHER): Payer: PRIVATE HEALTH INSURANCE | Admitting: *Deleted

## 2011-09-25 DIAGNOSIS — I4891 Unspecified atrial fibrillation: Secondary | ICD-10-CM

## 2011-09-25 DIAGNOSIS — Z7901 Long term (current) use of anticoagulants: Secondary | ICD-10-CM

## 2011-10-29 ENCOUNTER — Ambulatory Visit (INDEPENDENT_AMBULATORY_CARE_PROVIDER_SITE_OTHER): Payer: PRIVATE HEALTH INSURANCE | Admitting: *Deleted

## 2011-10-29 DIAGNOSIS — I4891 Unspecified atrial fibrillation: Secondary | ICD-10-CM

## 2011-10-29 DIAGNOSIS — Z7901 Long term (current) use of anticoagulants: Secondary | ICD-10-CM

## 2011-11-25 ENCOUNTER — Ambulatory Visit (INDEPENDENT_AMBULATORY_CARE_PROVIDER_SITE_OTHER): Payer: PRIVATE HEALTH INSURANCE | Admitting: *Deleted

## 2011-11-25 DIAGNOSIS — Z7901 Long term (current) use of anticoagulants: Secondary | ICD-10-CM

## 2011-11-25 DIAGNOSIS — I4891 Unspecified atrial fibrillation: Secondary | ICD-10-CM

## 2011-12-29 ENCOUNTER — Ambulatory Visit (INDEPENDENT_AMBULATORY_CARE_PROVIDER_SITE_OTHER): Payer: PRIVATE HEALTH INSURANCE | Admitting: *Deleted

## 2011-12-29 DIAGNOSIS — I4891 Unspecified atrial fibrillation: Secondary | ICD-10-CM

## 2011-12-29 DIAGNOSIS — Z7901 Long term (current) use of anticoagulants: Secondary | ICD-10-CM

## 2012-01-01 ENCOUNTER — Encounter: Payer: Self-pay | Admitting: Internal Medicine

## 2012-01-01 ENCOUNTER — Ambulatory Visit (INDEPENDENT_AMBULATORY_CARE_PROVIDER_SITE_OTHER): Payer: PRIVATE HEALTH INSURANCE | Admitting: Internal Medicine

## 2012-01-01 VITALS — BP 148/100 | HR 76 | Ht 64.0 in | Wt 167.0 lb

## 2012-01-01 DIAGNOSIS — I4891 Unspecified atrial fibrillation: Secondary | ICD-10-CM

## 2012-01-01 DIAGNOSIS — Z950 Presence of cardiac pacemaker: Secondary | ICD-10-CM

## 2012-01-01 LAB — HEPATIC FUNCTION PANEL
AST: 16 U/L (ref 0–37)
Bilirubin, Direct: 0.1 mg/dL (ref 0.0–0.3)
Total Bilirubin: 0.4 mg/dL (ref 0.3–1.2)

## 2012-01-01 LAB — PACEMAKER DEVICE OBSERVATION
AL IMPEDENCE PM: 530 Ohm
AL THRESHOLD: 0.8 V
RV LEAD THRESHOLD: 1.2 V

## 2012-01-01 LAB — TSH: TSH: 0.849 u[IU]/mL (ref 0.350–4.500)

## 2012-01-01 NOTE — Patient Instructions (Signed)
Your physician wants you to follow-up in: 1 year with Dr Ladona Ridgel.  You will receive a reminder letter in the mail two months in advance. If you don't receive a letter, please call our office to schedule the follow-up appointment.  We will have [employer] start calling you monthly for device checks.

## 2012-01-01 NOTE — Assessment & Plan Note (Signed)
He is maintaining sinus rhythm very nicely on amiodarone. We'll check liver function test and a TSH today. No change in medical therapy.

## 2012-01-01 NOTE — Progress Notes (Signed)
HPI Phillip Kline returns today for followup. He is a very pleasant 66 year old man with a history of paroxysmal atrial fibrillation, symptomatic tachybradycardia syndrome, status post permanent pacemaker insertion. He has been nicely controlled in sinus rhythm on amiodarone. He has no palpitations. He denies chest pain or shortness of breath. No peripheral edema. No cough or hemoptysis. No nausea or vomiting. Not on File   Current Outpatient Prescriptions  Medication Sig Dispense Refill  . amiodarone (PACERONE) 200 MG tablet Take 1 tablet (200 mg total) by mouth daily.  90 tablet  3  . ARIPiprazole (ABILIFY) 30 MG tablet Take 15 mg by mouth daily. 1/2 tab daily       . Calcium Carbonate-Vitamin D (CALCIUM 500/D) 500-125 MG-UNIT TABS Take 1 tablet by mouth 2 (two) times daily.        . hydrochlorothiazide 25 MG tablet Take 25 mg by mouth daily.        . pravastatin (PRAVACHOL) 80 MG tablet Take 80 mg by mouth daily.       . sertraline (ZOLOFT) 100 MG tablet Take 100 mg by mouth daily.        Marland Kitchen warfarin (COUMADIN) 2 MG tablet Take as directed by the Anticoagulation Clinic.  90 tablet  0     Past Medical History  Diagnosis Date  . Hypertension   . Hyperlipidemia   . Anxiety   . Coronary artery disease   . Atrial fibrillation   . Atrial flutter   . Sinus bradycardia     ROS:   All systems reviewed and negative except as noted in the HPI.   Past Surgical History  Procedure Date  . Hernia repair   . Cataract extraction   . Replacement total knee     right knee  . Thumb surgery     left and right  . Permanent pacemaker 2005    guidant insignia DDD     Family History  Problem Relation Age of Onset  . Cancer Father     lung cancer     History   Social History  . Marital Status: Single    Spouse Name: N/A    Number of Children: N/A  . Years of Education: N/A   Occupational History  . Not on file.   Social History Main Topics  . Smoking status: Never Smoker   .  Smokeless tobacco: Not on file  . Alcohol Use: Yes     rarely  . Drug Use: Yes     rare marijuana   . Sexually Active: Not on file   Other Topics Concern  . Not on file   Social History Narrative  . No narrative on file     BP 148/100  Pulse 76  Ht 5\' 4"  (1.626 m)  Wt 167 lb (75.751 kg)  BMI 28.67 kg/m2  Physical Exam:  Well appearing middle-aged man, NAD HEENT: Unremarkable Neck:  No JVD, no thyromegally Lungs:  Clear with no wheezes, rales, or rhonchi. HEART:  Regular rate rhythm, no murmurs, no rubs, no clicks Abd:  soft, positive bowel sounds, no organomegally, no rebound, no guarding Ext:  2 plus pulses, no edema, no cyanosis, no clubbing Skin:  No rashes no nodules Neuro:  CN II through XII intact, motor grossly intact  DEVICE  Normal device function.  See PaceArt for details.   Assess/Plan:

## 2012-01-01 NOTE — Assessment & Plan Note (Signed)
His device is working normally but is approaching elective replacement. We'll plan to see him back in several months.

## 2012-01-21 ENCOUNTER — Other Ambulatory Visit: Payer: Self-pay | Admitting: Cardiology

## 2012-01-21 ENCOUNTER — Other Ambulatory Visit: Payer: Self-pay | Admitting: *Deleted

## 2012-01-21 MED ORDER — AMIODARONE HCL 200 MG PO TABS: 200.0000 mg | ORAL_TABLET | Freq: Every day | ORAL | Status: DC

## 2012-01-21 MED ORDER — WARFARIN SODIUM 2 MG PO TABS: ORAL_TABLET | ORAL | Status: DC

## 2012-01-26 ENCOUNTER — Ambulatory Visit (INDEPENDENT_AMBULATORY_CARE_PROVIDER_SITE_OTHER): Payer: PRIVATE HEALTH INSURANCE | Admitting: *Deleted

## 2012-01-26 DIAGNOSIS — Z7901 Long term (current) use of anticoagulants: Secondary | ICD-10-CM

## 2012-01-26 DIAGNOSIS — I4891 Unspecified atrial fibrillation: Secondary | ICD-10-CM

## 2012-01-26 LAB — POCT INR: INR: 2

## 2012-02-03 DIAGNOSIS — I495 Sick sinus syndrome: Secondary | ICD-10-CM

## 2012-03-01 ENCOUNTER — Ambulatory Visit (INDEPENDENT_AMBULATORY_CARE_PROVIDER_SITE_OTHER): Payer: PRIVATE HEALTH INSURANCE | Admitting: Pharmacist

## 2012-03-01 DIAGNOSIS — Z7901 Long term (current) use of anticoagulants: Secondary | ICD-10-CM

## 2012-03-01 DIAGNOSIS — I4891 Unspecified atrial fibrillation: Secondary | ICD-10-CM

## 2012-03-01 LAB — POCT INR: INR: 2.3

## 2012-03-14 ENCOUNTER — Other Ambulatory Visit: Payer: Self-pay | Admitting: *Deleted

## 2012-03-14 MED ORDER — WARFARIN SODIUM 2 MG PO TABS: ORAL_TABLET | ORAL | Status: DC

## 2012-03-22 DIAGNOSIS — I495 Sick sinus syndrome: Secondary | ICD-10-CM

## 2012-03-29 ENCOUNTER — Ambulatory Visit (INDEPENDENT_AMBULATORY_CARE_PROVIDER_SITE_OTHER): Payer: PRIVATE HEALTH INSURANCE | Admitting: Pharmacist

## 2012-03-29 DIAGNOSIS — I4891 Unspecified atrial fibrillation: Secondary | ICD-10-CM

## 2012-03-29 DIAGNOSIS — Z7901 Long term (current) use of anticoagulants: Secondary | ICD-10-CM

## 2012-03-29 LAB — POCT INR: INR: 3.4

## 2012-04-19 ENCOUNTER — Ambulatory Visit (INDEPENDENT_AMBULATORY_CARE_PROVIDER_SITE_OTHER): Payer: PRIVATE HEALTH INSURANCE | Admitting: *Deleted

## 2012-04-19 DIAGNOSIS — I4891 Unspecified atrial fibrillation: Secondary | ICD-10-CM

## 2012-04-19 DIAGNOSIS — Z7901 Long term (current) use of anticoagulants: Secondary | ICD-10-CM

## 2012-04-19 LAB — POCT INR: INR: 2.3

## 2012-05-17 DIAGNOSIS — I495 Sick sinus syndrome: Secondary | ICD-10-CM

## 2012-05-30 ENCOUNTER — Ambulatory Visit (INDEPENDENT_AMBULATORY_CARE_PROVIDER_SITE_OTHER): Payer: PRIVATE HEALTH INSURANCE

## 2012-05-30 DIAGNOSIS — I4891 Unspecified atrial fibrillation: Secondary | ICD-10-CM

## 2012-05-30 DIAGNOSIS — Z7901 Long term (current) use of anticoagulants: Secondary | ICD-10-CM

## 2012-05-30 LAB — POCT INR: INR: 3

## 2012-06-13 ENCOUNTER — Other Ambulatory Visit: Payer: Self-pay | Admitting: Cardiology

## 2012-06-13 MED ORDER — AMIODARONE HCL 200 MG PO TABS: 200.0000 mg | ORAL_TABLET | Freq: Every day | ORAL | Status: DC

## 2012-06-29 ENCOUNTER — Other Ambulatory Visit: Payer: Self-pay | Admitting: Cardiology

## 2012-06-29 ENCOUNTER — Ambulatory Visit (INDEPENDENT_AMBULATORY_CARE_PROVIDER_SITE_OTHER): Payer: PRIVATE HEALTH INSURANCE | Admitting: *Deleted

## 2012-06-29 DIAGNOSIS — Z7901 Long term (current) use of anticoagulants: Secondary | ICD-10-CM

## 2012-06-29 DIAGNOSIS — I4891 Unspecified atrial fibrillation: Secondary | ICD-10-CM

## 2012-06-29 LAB — POCT INR: INR: 1.4

## 2012-06-29 MED ORDER — WARFARIN SODIUM 2 MG PO TABS: 2.0000 mg | ORAL_TABLET | ORAL | Status: DC

## 2012-06-29 MED ORDER — WARFARIN SODIUM 2 MG PO TABS: ORAL_TABLET | ORAL | Status: DC

## 2012-06-29 NOTE — Telephone Encounter (Signed)
Refill done at time of office visit today. Called pharmacy to verify that they have refill and they state that they do

## 2012-09-12 ENCOUNTER — Ambulatory Visit (INDEPENDENT_AMBULATORY_CARE_PROVIDER_SITE_OTHER): Payer: PRIVATE HEALTH INSURANCE

## 2012-09-12 DIAGNOSIS — Z7901 Long term (current) use of anticoagulants: Secondary | ICD-10-CM

## 2012-09-12 DIAGNOSIS — I4891 Unspecified atrial fibrillation: Secondary | ICD-10-CM

## 2012-09-23 ENCOUNTER — Ambulatory Visit (INDEPENDENT_AMBULATORY_CARE_PROVIDER_SITE_OTHER): Payer: PRIVATE HEALTH INSURANCE | Admitting: *Deleted

## 2012-09-23 DIAGNOSIS — I4891 Unspecified atrial fibrillation: Secondary | ICD-10-CM

## 2012-09-23 DIAGNOSIS — Z7901 Long term (current) use of anticoagulants: Secondary | ICD-10-CM

## 2012-11-04 ENCOUNTER — Encounter: Payer: Self-pay | Admitting: Internal Medicine

## 2012-11-17 ENCOUNTER — Ambulatory Visit (INDEPENDENT_AMBULATORY_CARE_PROVIDER_SITE_OTHER): Payer: PRIVATE HEALTH INSURANCE | Admitting: Pharmacist

## 2012-11-17 ENCOUNTER — Other Ambulatory Visit: Payer: Self-pay | Admitting: *Deleted

## 2012-11-17 DIAGNOSIS — Z7901 Long term (current) use of anticoagulants: Secondary | ICD-10-CM

## 2012-11-17 DIAGNOSIS — I4891 Unspecified atrial fibrillation: Secondary | ICD-10-CM

## 2012-11-17 LAB — POCT INR: INR: 2.9

## 2012-11-17 MED ORDER — WARFARIN SODIUM 2 MG PO TABS: 2.0000 mg | ORAL_TABLET | ORAL | Status: DC

## 2012-11-18 ENCOUNTER — Other Ambulatory Visit: Payer: Self-pay | Admitting: *Deleted

## 2012-11-18 MED ORDER — WARFARIN SODIUM 2 MG PO TABS: ORAL_TABLET | ORAL | Status: DC

## 2012-12-27 ENCOUNTER — Encounter (HOSPITAL_COMMUNITY): Payer: Self-pay | Admitting: Emergency Medicine

## 2012-12-27 ENCOUNTER — Emergency Department (HOSPITAL_COMMUNITY)
Admission: EM | Admit: 2012-12-27 | Discharge: 2012-12-28 | Disposition: A | Discharge status: Home / Self Care | Payer: 59 | Attending: Emergency Medicine | Admitting: Emergency Medicine

## 2012-12-27 ENCOUNTER — Emergency Department (HOSPITAL_COMMUNITY): Payer: 59

## 2012-12-27 DIAGNOSIS — Z7901 Long term (current) use of anticoagulants: Secondary | ICD-10-CM | POA: Insufficient documentation

## 2012-12-27 DIAGNOSIS — Z950 Presence of cardiac pacemaker: Secondary | ICD-10-CM | POA: Insufficient documentation

## 2012-12-27 DIAGNOSIS — T400X1A Poisoning by opium, accidental (unintentional), initial encounter: Secondary | ICD-10-CM | POA: Insufficient documentation

## 2012-12-27 DIAGNOSIS — Z79899 Other long term (current) drug therapy: Secondary | ICD-10-CM | POA: Insufficient documentation

## 2012-12-27 DIAGNOSIS — R55 Syncope and collapse: Secondary | ICD-10-CM

## 2012-12-27 DIAGNOSIS — I10 Essential (primary) hypertension: Secondary | ICD-10-CM | POA: Insufficient documentation

## 2012-12-27 DIAGNOSIS — F411 Generalized anxiety disorder: Secondary | ICD-10-CM | POA: Insufficient documentation

## 2012-12-27 DIAGNOSIS — Y9289 Other specified places as the place of occurrence of the external cause: Secondary | ICD-10-CM | POA: Insufficient documentation

## 2012-12-27 DIAGNOSIS — I2510 Atherosclerotic heart disease of native coronary artery without angina pectoris: Secondary | ICD-10-CM | POA: Insufficient documentation

## 2012-12-27 DIAGNOSIS — I4891 Unspecified atrial fibrillation: Secondary | ICD-10-CM | POA: Insufficient documentation

## 2012-12-27 DIAGNOSIS — Z8679 Personal history of other diseases of the circulatory system: Secondary | ICD-10-CM | POA: Insufficient documentation

## 2012-12-27 DIAGNOSIS — E785 Hyperlipidemia, unspecified: Secondary | ICD-10-CM | POA: Insufficient documentation

## 2012-12-27 DIAGNOSIS — Y9301 Activity, walking, marching and hiking: Secondary | ICD-10-CM | POA: Insufficient documentation

## 2012-12-27 NOTE — ED Notes (Signed)
O2 sat 91-93%-O2 increased to 4l/Ross

## 2012-12-27 NOTE — ED Notes (Signed)
NWG:NFAO<ZH> Expected date:<BR> Expected time:<BR> Means of arrival:<BR> Comments:<BR> EMS/68 yo male-unresponsive/Narcan

## 2012-12-28 LAB — PROTIME-INR
INR: 4.11 — ABNORMAL HIGH (ref 0.00–1.49)
Prothrombin Time: 37.3 seconds — ABNORMAL HIGH (ref 11.6–15.2)

## 2012-12-28 LAB — COMPREHENSIVE METABOLIC PANEL
ALT: 12 U/L (ref 0–53)
AST: 18 U/L (ref 0–37)
Albumin: 3.4 g/dL — ABNORMAL LOW (ref 3.5–5.2)
Alkaline Phosphatase: 63 U/L (ref 39–117)
BUN: 16 mg/dL (ref 6–23)
CO2: 29 mEq/L (ref 19–32)
Calcium: 8.6 mg/dL (ref 8.4–10.5)
Chloride: 97 mEq/L (ref 96–112)
Creatinine, Ser: 1.17 mg/dL (ref 0.50–1.35)
GFR calc Af Amer: 73 mL/min — ABNORMAL LOW (ref >90)
GFR calc non Af Amer: 63 mL/min — ABNORMAL LOW (ref >90)
Glucose, Bld: 221 mg/dL — ABNORMAL HIGH (ref 70–99)
Potassium: 3.4 mEq/L — ABNORMAL LOW (ref 3.5–5.1)
Sodium: 137 mEq/L (ref 135–145)
Total Bilirubin: 0.4 mg/dL (ref 0.3–1.2)
Total Protein: 7.3 g/dL (ref 6.0–8.3)

## 2012-12-28 LAB — CBC WITH DIFFERENTIAL/PLATELET
Basophils Absolute: 0 10*3/uL (ref 0.0–0.1)
Basophils Relative: 0 % (ref 0–1)
Eosinophils Absolute: 0.1 10*3/uL (ref 0.0–0.7)
Eosinophils Relative: 1 % (ref 0–5)
HCT: 41.2 % (ref 39.0–52.0)
Hemoglobin: 13.8 g/dL (ref 13.0–17.0)
Lymphocytes Relative: 22 % (ref 12–46)
Lymphs Abs: 1.6 10*3/uL (ref 0.7–4.0)
MCH: 33 pg (ref 26.0–34.0)
MCHC: 33.5 g/dL (ref 30.0–36.0)
MCV: 98.6 fL (ref 78.0–100.0)
Monocytes Absolute: 0.9 10*3/uL (ref 0.1–1.0)
Monocytes Relative: 12 % (ref 3–12)
Neutro Abs: 4.8 10*3/uL (ref 1.7–7.7)
Neutrophils Relative %: 65 % (ref 43–77)
Platelets: 177 10*3/uL (ref 150–400)
RBC: 4.18 MIL/uL — ABNORMAL LOW (ref 4.22–5.81)
RDW: 13.7 % (ref 11.5–15.5)
WBC: 7.3 10*3/uL (ref 4.0–10.5)

## 2012-12-28 LAB — RAPID URINE DRUG SCREEN, HOSP PERFORMED
Amphetamines: NOT DETECTED
Barbiturates: NOT DETECTED
Benzodiazepines: NOT DETECTED
Cocaine: POSITIVE — AB
Opiates: POSITIVE — AB
Tetrahydrocannabinol: POSITIVE — AB

## 2012-12-28 LAB — ETHANOL: Alcohol, Ethyl (B): <11 mg/dL (ref 0–11)

## 2012-12-28 MED ORDER — SODIUM CHLORIDE 0.9 % IV BOLUS (SEPSIS): 1000.0000 mL | Freq: Once | INTRAVENOUS | Status: DC

## 2012-12-28 NOTE — ED Notes (Signed)
Pt arrived via EMS with a complaint of a drug overdose.  Pt reports to staff and EMS that he took 2 hydrocodone and passed out.  Pt was given 2 mg of Narcan and became responsive.

## 2012-12-28 NOTE — ED Notes (Signed)
MP atrial fib with intermittent paced rhythm

## 2012-12-28 NOTE — ED Notes (Signed)
18G IV taken out LAC, was not documented by previous RN, catheter intact, per protocol, site CDI.

## 2012-12-28 NOTE — ED Notes (Signed)
Writer informed pt that a urine sample is needed.

## 2013-01-01 NOTE — ED Provider Notes (Signed)
History    67yM brought in by EMS after possible syncopal event. Pt not clear as to exact circumstances. Admits to hydrocodone usage. Not sure how much. Denies that intentionally trying to harm self. Denies other ingestion. Fiance at bedside states that he got up from chair on porch, staggered a few steps and then fell. Not sure if completely passed out or not. Could not completely arouse but was moving and moaning to stimuli. (of note, fiance appears very drowsy as well and nodding off in chair when not spoken to). No seizure activity reported. Poorly responsive for EMS with brisk response to narcan.   CSN: [id number]  Arrival date & time 12/27/12  2334   First MD Initiated Contact with Patient 12/28/12 0034      Chief Complaint  Patient presents with  . Drug Overdose    (Consider location/radiation/quality/duration/timing/severity/associated sxs/prior treatment) HPI  Past Medical History  Diagnosis Date  . Hypertension   . Hyperlipidemia   . Anxiety   . Coronary artery disease   . Atrial fibrillation   . Atrial flutter   . Sinus bradycardia     Past Surgical History  Procedure Laterality Date  . Hernia repair    . Cataract extraction    . Replacement total knee      right knee  . Thumb surgery      left and right  . Permanent pacemaker  2005    guidant insignia DDD    Family History  Problem Relation Age of Onset  . Cancer Father     lung cancer    History  Substance Use Topics  . Smoking status: Never Smoker   . Smokeless tobacco: Not on file  . Alcohol Use: Yes     Comment: rarely      Review of Systems  All systems reviewed and negative, other than as noted in HPI.   Allergies  Review of patient's allergies indicates no known allergies.  Home Medications   Current Outpatient Rx  Name  Route  Sig  Dispense  Refill  . amiodarone (PACERONE) 200 MG tablet   Oral   Take 1 tablet (200 mg total) by mouth daily.   90 tablet   3   . ARIPiprazole  (ABILIFY) 15 MG tablet   Oral   Take 15 mg by mouth daily.         . Calcium Carbonate-Vitamin D (CALCIUM 500/D) 500-125 MG-UNIT TABS   Oral   Take 1 tablet by mouth daily.          . hydrochlorothiazide 25 MG tablet   Oral   Take 25 mg by mouth daily.           . pravastatin (PRAVACHOL) 80 MG tablet   Oral   Take 80 mg by mouth daily.          . sertraline (ZOLOFT) 100 MG tablet   Oral   Take 100 mg by mouth daily.           Marland Kitchen warfarin (COUMADIN) 2 MG tablet      Take as directed by the Anticoagulation Clinic.   35 tablet   3     BP 106/61  Pulse 69  Temp(Src) 98.3 F (36.8 C) (Oral)  Resp 18  SpO2 96%  Physical Exam  Nursing note and vitals reviewed. Constitutional: He appears well-developed and well-nourished. No distress.  No external signs of acute trauma  HENT:  Head: Normocephalic and atraumatic.  Eyes: Conjunctivae are normal.  Pupils are equal, round, and reactive to light. Right eye exhibits no discharge. Left eye exhibits no discharge.  Neck: Neck supple.  Cardiovascular: Normal rate, regular rhythm and normal heart sounds.  Exam reveals no gallop and no friction rub.   No murmur heard. Pulmonary/Chest: Effort normal and breath sounds normal. No respiratory distress.  Abdominal: Soft. He exhibits no distension. There is no tenderness.  Musculoskeletal: He exhibits no edema and no tenderness.  Neurological: He is alert.  Patient is drowsy, but awakens easily to conversational voice. Speech slightly slurred but understandable. Patient is following commands. Cranial nerves II through XII are intact. Strength is 5 out of 5 bilateral upper and lower extremities. Good finger nose testing bilaterally.  Skin: Skin is warm and dry.  Psychiatric: He has a normal mood and affect. His behavior is normal. Thought content normal.    ED Course  Procedures (including critical care time)  Labs Reviewed  CBC WITH DIFFERENTIAL - Abnormal; Notable for the  following:    RBC 4.18 (*)    All other components within normal limits  COMPREHENSIVE METABOLIC PANEL - Abnormal; Notable for the following:    Potassium 3.4 (*)    Glucose, Bld 221 (*)    Albumin 3.4 (*)    GFR calc non Af Amer 63 (*)    GFR calc Af Amer 73 (*)    All other components within normal limits  URINE RAPID DRUG SCREEN (HOSP PERFORMED) - Abnormal; Notable for the following:    Opiates POSITIVE (*)    Cocaine POSITIVE (*)    Tetrahydrocannabinol POSITIVE (*)    All other components within normal limits  PROTIME-INR - Abnormal; Notable for the following:    Prothrombin Time 37.3 (*)    INR 4.11 (*)    All other components within normal limits  ETHANOL   No results found.  EKG:  Rhythm: Nature fibrillation with intermittent ventricular pacing Rate: 72 ST segments: Nonspecific ST changes. Comparison: Little interval change from EKG from yesterday  1. Syncope       MDM  67 year old male with syncope and decreased mental status. Improvement Narcan. Likely secondary to opiate overdose. Patient does admit to taking hydrocodone, denies taking them in attempt for soft arm though. Patient was observed for over 3 hours after the administration of Narcan. His progress was become more awake throughout his ED stay. On discharge she is alert and oriented. Protecting his airway. Hemodynamically stable. Oxygen saturations in the mid to high 90s on room air and respiratory effort was adequate. Patient's INR was incidentally noted to be supratherapeutic at 4.1. No clinical evidence of bleeding. Patient was instructed to hold a dose and resume as previously described. Discussed the need to have repeat blood work within 3-5 days.        Raeford Razor, MD 01/01/13 2041

## 2013-01-05 ENCOUNTER — Ambulatory Visit (INDEPENDENT_AMBULATORY_CARE_PROVIDER_SITE_OTHER): Payer: PRIVATE HEALTH INSURANCE | Admitting: *Deleted

## 2013-01-05 ENCOUNTER — Encounter: Payer: PRIVATE HEALTH INSURANCE | Admitting: Internal Medicine

## 2013-01-05 DIAGNOSIS — I495 Sick sinus syndrome: Secondary | ICD-10-CM

## 2013-01-05 LAB — PACEMAKER DEVICE OBSERVATION
AL THRESHOLD: 0.6 V
DEVICE MODEL PM: 304535
RV LEAD THRESHOLD: 1 V

## 2013-01-05 NOTE — Progress Notes (Signed)
PPM check in office. 

## 2013-02-06 ENCOUNTER — Ambulatory Visit (INDEPENDENT_AMBULATORY_CARE_PROVIDER_SITE_OTHER): Payer: 59 | Admitting: *Deleted

## 2013-02-06 DIAGNOSIS — Z950 Presence of cardiac pacemaker: Secondary | ICD-10-CM

## 2013-02-06 DIAGNOSIS — Z8679 Personal history of other diseases of the circulatory system: Secondary | ICD-10-CM

## 2013-02-06 LAB — PACEMAKER DEVICE OBSERVATION

## 2013-02-06 NOTE — Progress Notes (Signed)
Battery check only in clinic.  Remaining battery life 0.21yrs. No charge. ROV 03/09/13 Estella Husk

## 2013-02-08 ENCOUNTER — Encounter: Payer: Self-pay | Admitting: Internal Medicine

## 2013-03-09 ENCOUNTER — Ambulatory Visit (INDEPENDENT_AMBULATORY_CARE_PROVIDER_SITE_OTHER): Payer: 59 | Admitting: *Deleted

## 2013-03-09 DIAGNOSIS — I495 Sick sinus syndrome: Secondary | ICD-10-CM

## 2013-03-28 ENCOUNTER — Encounter: Payer: Self-pay | Admitting: Internal Medicine

## 2013-04-10 ENCOUNTER — Ambulatory Visit (INDEPENDENT_AMBULATORY_CARE_PROVIDER_SITE_OTHER): Payer: 59 | Admitting: *Deleted

## 2013-04-10 DIAGNOSIS — I495 Sick sinus syndrome: Secondary | ICD-10-CM

## 2013-04-10 LAB — PACEMAKER DEVICE OBSERVATION

## 2013-04-10 NOTE — Progress Notes (Signed)
PPM interrogation only for battery longevity in office. 

## 2013-04-26 ENCOUNTER — Encounter: Payer: Self-pay | Admitting: Internal Medicine

## 2013-05-08 ENCOUNTER — Other Ambulatory Visit: Payer: Self-pay | Admitting: *Deleted

## 2013-05-08 MED ORDER — WARFARIN SODIUM 2 MG PO TABS: ORAL_TABLET | ORAL | Status: DC

## 2013-05-10 ENCOUNTER — Ambulatory Visit (INDEPENDENT_AMBULATORY_CARE_PROVIDER_SITE_OTHER): Payer: 59 | Admitting: *Deleted

## 2013-05-10 ENCOUNTER — Telehealth: Payer: Self-pay | Admitting: Internal Medicine

## 2013-05-10 ENCOUNTER — Ambulatory Visit (INDEPENDENT_AMBULATORY_CARE_PROVIDER_SITE_OTHER): Payer: 59 | Admitting: Pharmacist

## 2013-05-10 DIAGNOSIS — I4891 Unspecified atrial fibrillation: Secondary | ICD-10-CM

## 2013-05-10 DIAGNOSIS — I495 Sick sinus syndrome: Secondary | ICD-10-CM

## 2013-05-10 DIAGNOSIS — Z7901 Long term (current) use of anticoagulants: Secondary | ICD-10-CM

## 2013-05-10 LAB — PACEMAKER DEVICE OBSERVATION: DEVICE MODEL PM: 304535

## 2013-05-10 LAB — POCT INR: INR: 2.7

## 2013-05-10 NOTE — Telephone Encounter (Signed)
New message   Had coumadin ck today--There is a rx on his print out that he is not taking---want to tell someone.

## 2013-05-10 NOTE — Telephone Encounter (Signed)
Pt calls back, meds reviewed, List correct. Pt was looking at strength of his Calcium/Vitamin D and thought it was a med.

## 2013-05-10 NOTE — Progress Notes (Signed)
In office interrogation for battery longevity only. Patient has an estimated longevity of <6 mos. Patient will follow up with [facility] on 07-13-2013.

## 2013-05-22 ENCOUNTER — Encounter: Payer: Self-pay | Admitting: Internal Medicine

## 2013-06-07 ENCOUNTER — Other Ambulatory Visit: Payer: Self-pay | Admitting: *Deleted

## 2013-06-07 MED ORDER — WARFARIN SODIUM 2 MG PO TABS: ORAL_TABLET | ORAL | Status: DC

## 2013-06-07 NOTE — Telephone Encounter (Signed)
Talked with pt and he states  had to cancel appt today as he did not have co pay Appt rescheduled to 06/15/2013 and he states will keep his appt . Instructed that will order refill of 14 tablets so he will have enough until seen in clinic and pt states understanding.

## 2013-06-15 ENCOUNTER — Ambulatory Visit (INDEPENDENT_AMBULATORY_CARE_PROVIDER_SITE_OTHER): Payer: 59 | Admitting: *Deleted

## 2013-06-15 DIAGNOSIS — I4891 Unspecified atrial fibrillation: Secondary | ICD-10-CM

## 2013-06-15 DIAGNOSIS — Z7901 Long term (current) use of anticoagulants: Secondary | ICD-10-CM

## 2013-06-26 ENCOUNTER — Other Ambulatory Visit: Payer: Self-pay | Admitting: *Deleted

## 2013-06-26 MED ORDER — WARFARIN SODIUM 2 MG PO TABS: ORAL_TABLET | ORAL | Status: DC

## 2013-06-27 ENCOUNTER — Other Ambulatory Visit: Payer: Self-pay | Admitting: *Deleted

## 2013-06-27 MED ORDER — AMIODARONE HCL 200 MG PO TABS: 200.0000 mg | ORAL_TABLET | Freq: Every day | ORAL | Status: DC

## 2013-07-13 ENCOUNTER — Ambulatory Visit (INDEPENDENT_AMBULATORY_CARE_PROVIDER_SITE_OTHER): Payer: 59 | Admitting: *Deleted

## 2013-07-13 ENCOUNTER — Ambulatory Visit (INDEPENDENT_AMBULATORY_CARE_PROVIDER_SITE_OTHER): Payer: 59 | Admitting: Internal Medicine

## 2013-07-13 ENCOUNTER — Telehealth: Payer: Self-pay | Admitting: Internal Medicine

## 2013-07-13 ENCOUNTER — Encounter: Payer: Self-pay | Admitting: *Deleted

## 2013-07-13 ENCOUNTER — Encounter: Payer: Self-pay | Admitting: Internal Medicine

## 2013-07-13 VITALS — BP 136/94 | HR 59 | Ht 64.0 in | Wt 165.0 lb

## 2013-07-13 DIAGNOSIS — Z7901 Long term (current) use of anticoagulants: Secondary | ICD-10-CM

## 2013-07-13 DIAGNOSIS — I4891 Unspecified atrial fibrillation: Secondary | ICD-10-CM

## 2013-07-13 DIAGNOSIS — Z950 Presence of cardiac pacemaker: Secondary | ICD-10-CM

## 2013-07-13 LAB — MDC_IDC_ENUM_SESS_TYPE_INCLINIC
Brady Statistic RA Percent Paced: 31 %
Brady Statistic RV Percent Paced: 63 %
Implantable Pulse Generator Model: 1297
Implantable Pulse Generator Serial Number: 304535
Lead Channel Impedance Value: 530 Ohm
Lead Channel Setting Sensing Sensitivity: 2.5 mV

## 2013-07-13 NOTE — Telephone Encounter (Signed)
Medication was added to his list

## 2013-07-13 NOTE — Assessment & Plan Note (Signed)
His [employer] dual-chamber pacemaker has reached elective replacement. We will schedule generator change out in the next several weeks.

## 2013-07-13 NOTE — Assessment & Plan Note (Signed)
He is maintaining sinus rhythm very nicely. He'll continue his current medical therapy.

## 2013-07-13 NOTE — Patient Instructions (Signed)
Generator change in 07/2013  See instruction sheet for 08/07/13

## 2013-07-13 NOTE — Telephone Encounter (Signed)
New Prob    Pt would like to notify Dr. Ladona Ridgel that he is now taking trazodone HCL 100 mg instead of Abilify.

## 2013-07-13 NOTE — Progress Notes (Signed)
HPI [name] returns today for followup. He is a very pleasant 67-year-old man with a history of paroxysmal atrial fibrillation, symptomatic tachybradycardia syndrome, status post permanent pacemaker insertion. He has been nicely controlled in sinus rhythm on amiodarone. He has no palpitations. He denies chest pain or shortness of breath. No peripheral edema. No cough or hemoptysis. No nausea or vomiting. He has reached elective replacement on his pacemaker. No Known Allergies   Current Outpatient Prescriptions  Medication Sig Dispense Refill  . amiodarone (PACERONE) 200 MG tablet Take 1 tablet (200 mg total) by mouth daily.  20 tablet  0  . ARIPiprazole (ABILIFY) 15 MG tablet Take 15 mg by mouth daily.      . Calcium Carbonate-Vitamin D (CALCIUM 500/D) 500-125 MG-UNIT TABS Take 1 tablet by mouth daily.       . hydrochlorothiazide 25 MG tablet Take 25 mg by mouth daily.        . pravastatin (PRAVACHOL) 80 MG tablet Take 80 mg by mouth daily.       . sertraline (ZOLOFT) 100 MG tablet Take 100 mg by mouth daily.        . warfarin (COUMADIN) 2 MG tablet Take as directed by the Anticoagulation Clinic.  30 tablet  0   No current facility-administered medications for this visit.     Past Medical History  Diagnosis Date  . Hypertension   . Hyperlipidemia   . Anxiety   . Coronary artery disease   . Atrial fibrillation   . Atrial flutter   . Sinus bradycardia     ROS:   All systems reviewed and negative except as noted in the HPI.   Past Surgical History  Procedure Laterality Date  . Hernia repair    . Cataract extraction    . Replacement total knee      right knee  . Thumb surgery      left and right  . Permanent pacemaker  2005    guidant insignia DDD     Family History  Problem Relation Age of Onset  . Cancer Father     lung cancer     History   Social History  . Marital Status: Single    Spouse Name: N/A    Number of Children: N/A  . Years of Education: N/A    Occupational History  . Not on file.   Social History Main Topics  . Smoking status: Never Smoker   . Smokeless tobacco: Not on file  . Alcohol Use: Yes     Comment: rarely  . Drug Use: Yes     Comment: rare marijuana   . Sexual Activity: Not on file   Other Topics Concern  . Not on file   Social History Narrative  . No narrative on file     BP 136/94  Pulse 59  Ht 5' 4" (1.626 m)  Wt 165 lb (74.844 kg)  BMI 28.31 kg/m2  Physical Exam:  Well appearing middle-aged man, NAD HEENT: Unremarkable Neck:  No JVD, no thyromegally Lungs:  Clear with no wheezes, rales, or rhonchi. HEART:  Regular rate rhythm, no murmurs, no rubs, no clicks Abd:  soft, positive bowel sounds, no organomegally, no rebound, no guarding Ext:  2 plus pulses, no edema, no cyanosis, no clubbing Skin:  No rashes no nodules Neuro:  CN II through XII intact, motor grossly intact  DEVICE  Normal device function.  See PaceArt for details.   Assess/Plan:   

## 2013-07-14 ENCOUNTER — Other Ambulatory Visit: Payer: Self-pay | Admitting: *Deleted

## 2013-07-14 DIAGNOSIS — Z45018 Encounter for adjustment and management of other part of cardiac pacemaker: Secondary | ICD-10-CM

## 2013-07-18 ENCOUNTER — Other Ambulatory Visit: Payer: Self-pay | Admitting: *Deleted

## 2013-07-18 MED ORDER — AMIODARONE HCL 200 MG PO TABS: 200.0000 mg | ORAL_TABLET | Freq: Every day | ORAL | Status: DC

## 2013-07-21 ENCOUNTER — Encounter: Payer: Self-pay | Admitting: Internal Medicine

## 2013-07-25 ENCOUNTER — Encounter (HOSPITAL_COMMUNITY): Payer: Self-pay

## 2013-07-26 ENCOUNTER — Other Ambulatory Visit: Payer: Self-pay | Admitting: *Deleted

## 2013-07-26 MED ORDER — WARFARIN SODIUM 2 MG PO TABS: ORAL_TABLET | ORAL | Status: DC

## 2013-07-31 ENCOUNTER — Other Ambulatory Visit (INDEPENDENT_AMBULATORY_CARE_PROVIDER_SITE_OTHER): Payer: 59

## 2013-07-31 DIAGNOSIS — Z950 Presence of cardiac pacemaker: Secondary | ICD-10-CM

## 2013-07-31 LAB — CBC WITH DIFFERENTIAL/PLATELET
BASOS ABS: 0 10*3/uL (ref 0.0–0.1)
Basophils Relative: 0.4 % (ref 0.0–3.0)
Eosinophils Absolute: 0.1 10*3/uL (ref 0.0–0.7)
Eosinophils Relative: 1.2 % (ref 0.0–5.0)
HCT: 46.5 % (ref 39.0–52.0)
Hemoglobin: 15.9 g/dL (ref 13.0–17.0)
LYMPHS PCT: 18.1 % (ref 12.0–46.0)
Lymphs Abs: 1.3 10*3/uL (ref 0.7–4.0)
MCHC: 34.1 g/dL (ref 30.0–36.0)
MCV: 99.2 fl (ref 78.0–100.0)
MONO ABS: 0.7 10*3/uL (ref 0.1–1.0)
Monocytes Relative: 9.6 % (ref 3.0–12.0)
Neutro Abs: 5.1 10*3/uL (ref 1.4–7.7)
Neutrophils Relative %: 70.7 % (ref 43.0–77.0)
PLATELETS: 193 10*3/uL (ref 150.0–400.0)
RBC: 4.69 Mil/uL (ref 4.22–5.81)
RDW: 13.9 % (ref 11.5–14.6)
WBC: 7.2 10*3/uL (ref 4.5–10.5)

## 2013-07-31 LAB — PROTIME-INR
INR: 3 ratio — ABNORMAL HIGH (ref 0.8–1.0)
PROTHROMBIN TIME: 30.7 s — AB (ref 10.2–12.4)

## 2013-07-31 LAB — BASIC METABOLIC PANEL
BUN: 19 mg/dL (ref 6–23)
CHLORIDE: 103 meq/L (ref 96–112)
CO2: 31 meq/L (ref 19–32)
Calcium: 9.1 mg/dL (ref 8.4–10.5)
Creatinine, Ser: 1.2 mg/dL (ref 0.4–1.5)
GFR: 64.64 mL/min (ref >60.00)
Glucose, Bld: 118 mg/dL — ABNORMAL HIGH (ref 70–99)
Potassium: 4 mEq/L (ref 3.5–5.1)
Sodium: 140 mEq/L (ref 135–145)

## 2013-08-01 ENCOUNTER — Encounter: Payer: Self-pay | Admitting: Gastroenterology

## 2013-08-01 ENCOUNTER — Telehealth: Payer: Self-pay | Admitting: Internal Medicine

## 2013-08-01 NOTE — Telephone Encounter (Signed)
New Problem:  Pt has questions about his upcoming hospital procedure. Pt would like a call back from [place].

## 2013-08-01 NOTE — Telephone Encounter (Signed)
Discussed with Dr Lovena Le  Will hold Coumadin for 2 days prior to procedure  Last dose 08/04/13.  I have left him a message with this information

## 2013-08-01 NOTE — Telephone Encounter (Signed)
Left message on machine for patient.  Let him know I would ask Dr Lovena Le about holding his Coumadin with his INR being 3.0

## 2013-08-06 MED ORDER — CEFAZOLIN SODIUM-DEXTROSE 2-3 GM-% IV SOLR
2.0000 g | INTRAVENOUS | Status: DC
  Filled 2013-08-06: qty 50

## 2013-08-06 MED ORDER — SODIUM CHLORIDE 0.9 % IV SOLN
INTRAVENOUS | Status: DC
  Administered 2013-08-07: 50 mL/h via INTRAVENOUS

## 2013-08-06 MED ORDER — SODIUM CHLORIDE 0.9 % IR SOLN
80.0000 mg | Status: DC
  Filled 2013-08-06: qty 2

## 2013-08-06 MED ORDER — CHLORHEXIDINE GLUCONATE 4 % EX LIQD
60.0000 mL | Freq: Once | CUTANEOUS | Status: DC
  Filled 2013-08-06: qty 60

## 2013-08-07 ENCOUNTER — Ambulatory Visit (HOSPITAL_COMMUNITY)
Admission: RE | Admit: 2013-08-07 | Discharge: 2013-08-07 | Disposition: A | Discharge status: Home / Self Care | Payer: 59 | Source: Ambulatory Visit | Attending: Internal Medicine | Admitting: Internal Medicine

## 2013-08-07 ENCOUNTER — Encounter (HOSPITAL_COMMUNITY)
Admission: RE | Disposition: A | Discharge status: Home / Self Care | Payer: Self-pay | Source: Ambulatory Visit | Attending: Internal Medicine

## 2013-08-07 DIAGNOSIS — I4891 Unspecified atrial fibrillation: Secondary | ICD-10-CM | POA: Insufficient documentation

## 2013-08-07 DIAGNOSIS — I495 Sick sinus syndrome: Secondary | ICD-10-CM | POA: Insufficient documentation

## 2013-08-07 DIAGNOSIS — I2510 Atherosclerotic heart disease of native coronary artery without angina pectoris: Secondary | ICD-10-CM | POA: Insufficient documentation

## 2013-08-07 DIAGNOSIS — F411 Generalized anxiety disorder: Secondary | ICD-10-CM | POA: Insufficient documentation

## 2013-08-07 DIAGNOSIS — I4892 Unspecified atrial flutter: Secondary | ICD-10-CM | POA: Insufficient documentation

## 2013-08-07 DIAGNOSIS — I10 Essential (primary) hypertension: Secondary | ICD-10-CM | POA: Insufficient documentation

## 2013-08-07 DIAGNOSIS — Z7901 Long term (current) use of anticoagulants: Secondary | ICD-10-CM | POA: Insufficient documentation

## 2013-08-07 DIAGNOSIS — Z45018 Encounter for adjustment and management of other part of cardiac pacemaker: Secondary | ICD-10-CM | POA: Insufficient documentation

## 2013-08-07 DIAGNOSIS — E785 Hyperlipidemia, unspecified: Secondary | ICD-10-CM | POA: Insufficient documentation

## 2013-08-07 HISTORY — PX: PERMANENT PACEMAKER GENERATOR CHANGE: SHX6022

## 2013-08-07 LAB — SURGICAL PCR SCREEN
MRSA, PCR: NEGATIVE
Staphylococcus aureus: NEGATIVE

## 2013-08-07 LAB — PROTIME-INR
INR: 1.5 — ABNORMAL HIGH (ref 0.00–1.49)
Prothrombin Time: 17.7 seconds — ABNORMAL HIGH (ref 11.6–15.2)

## 2013-08-07 SURGERY — PERMANENT PACEMAKER GENERATOR CHANGE
Anesthesia: LOCAL

## 2013-08-07 MED ORDER — LIDOCAINE HCL (PF) 1 % IJ SOLN
INTRAMUSCULAR | Status: AC
  Filled 2013-08-07: qty 60

## 2013-08-07 MED ORDER — MIDAZOLAM HCL 5 MG/5ML IJ SOLN
INTRAMUSCULAR | Status: AC
  Filled 2013-08-07: qty 5

## 2013-08-07 MED ORDER — ACETAMINOPHEN 325 MG PO TABS
325.0000 mg | ORAL_TABLET | ORAL | Status: DC | PRN
  Filled 2013-08-07: qty 2

## 2013-08-07 MED ORDER — MUPIROCIN 2 % EX OINT
TOPICAL_OINTMENT | Freq: Once | CUTANEOUS | Status: AC
  Administered 2013-08-07: 1 via NASAL
  Filled 2013-08-07: qty 22

## 2013-08-07 MED ORDER — ONDANSETRON HCL 4 MG/2ML IJ SOLN: 4.0000 mg | Freq: Four times a day (QID) | INTRAMUSCULAR | Status: DC | PRN

## 2013-08-07 MED ORDER — FENTANYL CITRATE 0.05 MG/ML IJ SOLN
INTRAMUSCULAR | Status: AC
  Filled 2013-08-07: qty 2

## 2013-08-07 NOTE — Interval H&P Note (Signed)
History and Physical Interval Note:  08/07/2013 8:58 AM  Phillip Kline  has presented today for surgery, with the diagnosis of eri  The various methods of treatment have been discussed with the patient and family. After consideration of risks, benefits and other options for treatment, the patient has consented to  Procedure(s): PERMANENT PACEMAKER GENERATOR CHANGE (N/A) as a surgical intervention .  The patient's history has been reviewed, patient examined, no change in status, stable for surgery.  I have reviewed the patient's chart and labs.  Questions were answered to the patient's satisfaction.     Mikle Bosworth.D.

## 2013-08-07 NOTE — H&P (View-Only) (Signed)
HPI Phillip Kline returns today for followup. He is a very pleasant 68 year old man with a history of paroxysmal atrial fibrillation, symptomatic tachybradycardia syndrome, status post permanent pacemaker insertion. He has been nicely controlled in sinus rhythm on amiodarone. He has no palpitations. He denies chest pain or shortness of breath. No peripheral edema. No cough or hemoptysis. No nausea or vomiting. He has reached elective replacement on his pacemaker. No Known Allergies   Current Outpatient Prescriptions  Medication Sig Dispense Refill  . amiodarone (PACERONE) 200 MG tablet Take 1 tablet (200 mg total) by mouth daily.  20 tablet  0  . ARIPiprazole (ABILIFY) 15 MG tablet Take 15 mg by mouth daily.      . Calcium Carbonate-Vitamin D (CALCIUM 500/D) 500-125 MG-UNIT TABS Take 1 tablet by mouth daily.       . hydrochlorothiazide 25 MG tablet Take 25 mg by mouth daily.        . pravastatin (PRAVACHOL) 80 MG tablet Take 80 mg by mouth daily.       . sertraline (ZOLOFT) 100 MG tablet Take 100 mg by mouth daily.        Marland Kitchen warfarin (COUMADIN) 2 MG tablet Take as directed by the Anticoagulation Clinic.  30 tablet  0   No current facility-administered medications for this visit.     Past Medical History  Diagnosis Date  . Hypertension   . Hyperlipidemia   . Anxiety   . Coronary artery disease   . Atrial fibrillation   . Atrial flutter   . Sinus bradycardia     ROS:   All systems reviewed and negative except as noted in the HPI.   Past Surgical History  Procedure Laterality Date  . Hernia repair    . Cataract extraction    . Replacement total knee      right knee  . Thumb surgery      left and right  . Permanent pacemaker  2005    guidant insignia DDD     Family History  Problem Relation Age of Onset  . Cancer Father     lung cancer     History   Social History  . Marital Status: Single    Spouse Name: N/A    Number of Children: N/A  . Years of Education: N/A    Occupational History  . Not on file.   Social History Main Topics  . Smoking status: Never Smoker   . Smokeless tobacco: Not on file  . Alcohol Use: Yes     Comment: rarely  . Drug Use: Yes     Comment: rare marijuana   . Sexual Activity: Not on file   Other Topics Concern  . Not on file   Social History Narrative  . No narrative on file     BP 136/94  Pulse 59  Ht 5\' 4"  (1.626 m)  Wt 165 lb (74.844 kg)  BMI 28.31 kg/m2  Physical Exam:  Well appearing middle-aged man, NAD HEENT: Unremarkable Neck:  No JVD, no thyromegally Lungs:  Clear with no wheezes, rales, or rhonchi. HEART:  Regular rate rhythm, no murmurs, no rubs, no clicks Abd:  soft, positive bowel sounds, no organomegally, no rebound, no guarding Ext:  2 plus pulses, no edema, no cyanosis, no clubbing Skin:  No rashes no nodules Neuro:  CN II through XII intact, motor grossly intact  DEVICE  Normal device function.  See PaceArt for details.   Assess/Plan:

## 2013-08-07 NOTE — CV Procedure (Signed)
Electrophysiology procedure note  Procedure: Removal of a previous implanted dual-chamber pacemaker, insertion of a new dual-chamber pacemaker, with pacemaker pocket revision  Indication: Symptomatic bradycardia secondary to sinus node dysfunction, status post prior pacemaker insertion, with the old device at elective replacement  Findings: After informed consent was obtained, the patient was taken to the diagnostic electrophysiology laboratory in the fasting state. After the usual preparation and draping, intravenous fentanyl and Versed were given for sedation. 30 cc of lidocaine was infiltrated into the left infraclavicular region. A 6 cm incision was carried out over this region and electrocautery was utilized to dissect down to the pacemaker pocket. The pacemaker generator was removed. The atrial and ventricular leads were evaluated, and found to be working satisfactorily. The patient's underlying rhythm today was atrial fibrillation. Previously it had been in sinus rhythm. The new [employer] dual-chamber pacemaker serial [id number] was connected to the old pacing leads and placed back in the subcutaneous pocket. The new device was close to the shoulder. The generator was removed. The pacemaker pocket was revised medially. The generator was secured with silk suture in this more medial location. The pocket was here again with antibiotic irrigation. The incision was closed with 2 layers of Vicryl suture. Benzoin and Steri-Strips were pain on the skin. Pressure was held for 5 minutes. A bandage was placed and the patient was returned to his room in satisfactory condition.  Complications: There were no immediate procedural complications  Conclusions: This demonstrate successful removal of a previously implanted [employer] dual-chamber pacemaker as the device had reached elective replacement, with insertion of a new [employer] dual-chamber pacemaker with revision of the pacemaker pocket  more medially.  Cristopher Peru, M.D.

## 2013-08-07 NOTE — Discharge Instructions (Signed)
° °  Supplemental Discharge Instructions for  Post Pacemaker Generator Change  WOUND CARE   Keep the wound area clean and dry.  Do not get this area wet for one week. No showers for one week; you may shower on 08/16/2013.   The tape/steri-strips on your wound will fall off; do not pull them off.  No bandage is needed on the site.  DO  NOT apply any creams, oils, or ointments to the wound area.   If you notice any drainage or discharge from the wound, any swelling or bruising at the site, or you develop a fever > 101? F after you are discharged home, call the office at once.

## 2013-08-07 NOTE — Progress Notes (Signed)
Corene Cornea, [employer] rep, stated that he would see the patient in the outpatient setting at the Dr. Tanna Furry office.  Patient could be discharged home.

## 2013-08-11 ENCOUNTER — Encounter (HOSPITAL_COMMUNITY): Payer: Self-pay | Admitting: *Deleted

## 2013-08-17 ENCOUNTER — Ambulatory Visit: Payer: 59

## 2013-08-17 ENCOUNTER — Ambulatory Visit (INDEPENDENT_AMBULATORY_CARE_PROVIDER_SITE_OTHER): Payer: 59 | Admitting: *Deleted

## 2013-08-17 DIAGNOSIS — I4891 Unspecified atrial fibrillation: Secondary | ICD-10-CM

## 2013-08-17 LAB — MDC_IDC_ENUM_SESS_TYPE_INCLINIC
Date Time Interrogation Session: 20150122050000
Implantable Pulse Generator Serial Number: 389910
Lead Channel Impedance Value: 442 Ohm
Lead Channel Impedance Value: 814 Ohm
Lead Channel Sensing Intrinsic Amplitude: 0.9 mV
Lead Channel Sensing Intrinsic Amplitude: 8.5 mV
Lead Channel Setting Pacing Amplitude: 2.4 V
Lead Channel Setting Pacing Pulse Width: 0.4 ms
Lead Channel Setting Sensing Sensitivity: 2.5 mV
MDC IDC MSMT BATTERY REMAINING LONGEVITY: 161 mo
MDC IDC SET LEADCHNL RA PACING AMPLITUDE: 2 V
MDC IDC SET ZONE DETECTION INTERVAL: 375 ms
MDC IDC STAT BRADY RA PERCENT PACED: 1 %
MDC IDC STAT BRADY RV PERCENT PACED: 39 %

## 2013-08-17 NOTE — Progress Notes (Signed)
Wound check appointment. Steri-strips removed. Wound without redness or edema. Incision edges approximated, wound well healed. Normal device function. Thresholds, sensing, and impedances consistent with implant measurements. Device programmed at 3.5V/auto capture programmed on for extra safety margin until 3 month visit. Histogram distribution appropriate for patient and level of activity. A-fib, + coumadin.  No high ventricular rates noted. Patient educated about wound care, arm mobility, lifting restrictions. ROV in 3 months with implanting physician.  Checked by [employer].

## 2013-08-21 ENCOUNTER — Other Ambulatory Visit: Payer: Self-pay | Admitting: *Deleted

## 2013-08-21 MED ORDER — WARFARIN SODIUM 2 MG PO TABS: ORAL_TABLET | ORAL | Status: DC

## 2013-08-21 NOTE — Telephone Encounter (Signed)
refill 

## 2013-08-28 ENCOUNTER — Encounter: Payer: Self-pay | Admitting: Internal Medicine

## 2013-08-29 ENCOUNTER — Ambulatory Visit (INDEPENDENT_AMBULATORY_CARE_PROVIDER_SITE_OTHER): Payer: 59

## 2013-08-29 DIAGNOSIS — I4891 Unspecified atrial fibrillation: Secondary | ICD-10-CM

## 2013-08-29 DIAGNOSIS — Z7901 Long term (current) use of anticoagulants: Secondary | ICD-10-CM

## 2013-08-29 DIAGNOSIS — Z5181 Encounter for therapeutic drug level monitoring: Secondary | ICD-10-CM

## 2013-08-29 LAB — POCT INR: INR: 2.8

## 2013-10-10 ENCOUNTER — Ambulatory Visit (INDEPENDENT_AMBULATORY_CARE_PROVIDER_SITE_OTHER): Payer: 59 | Admitting: Pharmacist

## 2013-10-10 DIAGNOSIS — I4891 Unspecified atrial fibrillation: Secondary | ICD-10-CM

## 2013-10-10 DIAGNOSIS — Z7901 Long term (current) use of anticoagulants: Secondary | ICD-10-CM

## 2013-10-10 DIAGNOSIS — Z5181 Encounter for therapeutic drug level monitoring: Secondary | ICD-10-CM

## 2013-10-10 LAB — POCT INR: INR: 2.6

## 2013-11-14 ENCOUNTER — Encounter: Payer: Self-pay | Admitting: Internal Medicine

## 2013-11-14 ENCOUNTER — Encounter (INDEPENDENT_AMBULATORY_CARE_PROVIDER_SITE_OTHER): Payer: Self-pay

## 2013-11-14 ENCOUNTER — Ambulatory Visit (INDEPENDENT_AMBULATORY_CARE_PROVIDER_SITE_OTHER): Payer: 59 | Admitting: *Deleted

## 2013-11-14 ENCOUNTER — Ambulatory Visit (INDEPENDENT_AMBULATORY_CARE_PROVIDER_SITE_OTHER): Payer: 59 | Admitting: Internal Medicine

## 2013-11-14 VITALS — BP 141/91 | HR 67 | Ht 64.0 in | Wt 164.0 lb

## 2013-11-14 DIAGNOSIS — Z7901 Long term (current) use of anticoagulants: Secondary | ICD-10-CM

## 2013-11-14 DIAGNOSIS — I10 Essential (primary) hypertension: Secondary | ICD-10-CM

## 2013-11-14 DIAGNOSIS — Z5181 Encounter for therapeutic drug level monitoring: Secondary | ICD-10-CM

## 2013-11-14 DIAGNOSIS — Z950 Presence of cardiac pacemaker: Secondary | ICD-10-CM

## 2013-11-14 DIAGNOSIS — I4891 Unspecified atrial fibrillation: Secondary | ICD-10-CM

## 2013-11-14 LAB — MDC_IDC_ENUM_SESS_TYPE_INCLINIC
Brady Statistic RA Percent Paced: 2 %
Date Time Interrogation Session: 20150421040000
Lead Channel Impedance Value: 430 Ohm
Lead Channel Impedance Value: 810 Ohm
Lead Channel Setting Pacing Amplitude: 2 V
Lead Channel Setting Pacing Pulse Width: 0.4 ms
Lead Channel Setting Sensing Sensitivity: 2.5 mV
MDC IDC MSMT LEADCHNL RA SENSING INTR AMPL: 1.2 mV
MDC IDC MSMT LEADCHNL RV PACING THRESHOLD AMPLITUDE: 0.9 V
MDC IDC MSMT LEADCHNL RV PACING THRESHOLD PULSEWIDTH: 0.4 ms
MDC IDC MSMT LEADCHNL RV SENSING INTR AMPL: 9.1 mV
MDC IDC PG SERIAL: 389910
MDC IDC SET LEADCHNL RV PACING AMPLITUDE: 2.4 V
MDC IDC STAT BRADY RV PERCENT PACED: 40 %
Zone Setting Detection Interval: 375 ms

## 2013-11-14 LAB — POCT INR: INR: 2.3

## 2013-11-14 NOTE — Progress Notes (Signed)
HPI Phillip Kline returns today for followup. He is a very pleasant 68 year old man with a history of paroxysmal atrial fibrillation, symptomatic tachybrady syndrome, status post permanent pacemaker insertion. He had been nicely controlled in sinus rhythm on amiodarone but he has recently developed recurrent atrial fib for which he is not symptomatic.  He has no palpitations. He denies chest pain or shortness of breath. No peripheral edema. No cough or hemoptysis. No nausea or vomiting. His PPM has been changed out and he has felt well.. No Known Allergies   Current Outpatient Prescriptions  Medication Sig Dispense Refill  . Calcium Carbonate-Vitamin D (CALCIUM 500/D) 500-125 MG-UNIT TABS Take 1 tablet by mouth 2 (two) times daily.       . hydrochlorothiazide 25 MG tablet Take 25 mg by mouth daily.        . pravastatin (PRAVACHOL) 80 MG tablet Take 80 mg by mouth daily.       . sertraline (ZOLOFT) 100 MG tablet Take 100 mg by mouth daily.        . traZODone (DESYREL) 100 MG tablet Take 50-100 mg by mouth at bedtime.       Marland Kitchen VIAGRA 25 MG tablet As directed      . warfarin (COUMADIN) 2 MG tablet Take as directed by coumadin clinic  30 tablet  3   No current facility-administered medications for this visit.     Past Medical History  Diagnosis Date  . Hypertension   . Hyperlipidemia   . Anxiety   . Coronary artery disease   . Atrial fibrillation   . Atrial flutter   . Sinus bradycardia     ROS:   All systems reviewed and negative except as noted in the HPI.   Past Surgical History  Procedure Laterality Date  . Hernia repair    . Cataract extraction    . Replacement total knee      right knee  . Thumb surgery      left and right  . Permanent pacemaker insertion  2005; 08/07/2013    BSX dual chamber pacemaker implanted 2005 for symptomatic bradycardia; gen change 2015 by Dr Lovena Le     Family History  Problem Relation Age of Onset  . Cancer Father     lung cancer      History   Social History  . Marital Status: Single    Spouse Name: N/A    Number of Children: N/A  . Years of Education: N/A   Occupational History  . Not on file.   Social History Main Topics  . Smoking status: Never Smoker   . Smokeless tobacco: Not on file  . Alcohol Use: Yes     Comment: rarely  . Drug Use: Yes     Comment: rare marijuana   . Sexual Activity: Not on file   Other Topics Concern  . Not on file   Social History Narrative  . No narrative on file     BP 141/91  Pulse 67  Ht 5\' 4"  (1.626 m)  Wt 164 lb (74.39 kg)  BMI 28.14 kg/m2  Physical Exam:  Well appearing middle-aged man, NAD HEENT: Unremarkable Neck:  No JVD, no thyromegally Lungs:  Clear with no wheezes, rales, or rhonchi. HEART:  IRegular rate rhythm, no murmurs, no rubs, no clicks Abd:  soft, positive bowel sounds, no organomegally, no rebound, no guarding Ext:  2 plus pulses, no edema, no cyanosis, no clubbing Skin:  No rashes no nodules Neuro:  CN  II through XII intact, motor grossly intact  DEVICE  Normal device function.  See PaceArt for details.   Assess/Plan:

## 2013-11-14 NOTE — Assessment & Plan Note (Signed)
His blood pressure has been controlled. He will continue his current meds. I have asked the patient to reduce his sodium intake.

## 2013-11-14 NOTE — Patient Instructions (Addendum)
Your physician wants you to follow-up in: 9 months with Dr Knox Saliva will receive a reminder letter in the mail two months in advance. If you don't receive a letter, please call our office to schedule the follow-up appointment.   Remote monitoring is used to monitor your Pacemaker of ICD from home. This monitoring reduces the number of office visits required to check your device to one time per year. It allows [place] to keep an eye on the functioning of your device to ensure it is working properly. You are scheduled for a device check from home on 02/15/14. You may send your transmission at any time that day. If you have a wireless device, the transmission will be sent automatically. After your physician reviews your transmission, you will receive a postcard with your next transmission date.  Your physician has recommended you make the following change in your medication:  1) Stop Amiodarone

## 2013-11-14 NOTE — Assessment & Plan Note (Signed)
He is asymptomatic and his ventricular rate is controlled. I have asked the patient to stop amiodarone as he is not inclined to undergo repeat DCCV and uptitration of his amio as he does not feel his atrial fib.

## 2013-11-14 NOTE — Assessment & Plan Note (Signed)
His [employer] PPM is working normally. Will recheck in several months.

## 2013-12-01 ENCOUNTER — Ambulatory Visit (INDEPENDENT_AMBULATORY_CARE_PROVIDER_SITE_OTHER): Payer: 59 | Admitting: Pharmacist

## 2013-12-01 DIAGNOSIS — Z7901 Long term (current) use of anticoagulants: Secondary | ICD-10-CM

## 2013-12-01 DIAGNOSIS — Z5181 Encounter for therapeutic drug level monitoring: Secondary | ICD-10-CM

## 2013-12-01 DIAGNOSIS — I4891 Unspecified atrial fibrillation: Secondary | ICD-10-CM

## 2013-12-01 LAB — POCT INR: INR: 2.9

## 2013-12-28 ENCOUNTER — Other Ambulatory Visit: Payer: Self-pay | Admitting: Internal Medicine

## 2013-12-29 ENCOUNTER — Ambulatory Visit (INDEPENDENT_AMBULATORY_CARE_PROVIDER_SITE_OTHER): Payer: 59

## 2013-12-29 DIAGNOSIS — Z7901 Long term (current) use of anticoagulants: Secondary | ICD-10-CM

## 2013-12-29 DIAGNOSIS — Z5181 Encounter for therapeutic drug level monitoring: Secondary | ICD-10-CM

## 2013-12-29 DIAGNOSIS — I4891 Unspecified atrial fibrillation: Secondary | ICD-10-CM

## 2013-12-29 LAB — POCT INR: INR: 2.2

## 2014-01-25 ENCOUNTER — Ambulatory Visit (INDEPENDENT_AMBULATORY_CARE_PROVIDER_SITE_OTHER): Payer: 59 | Admitting: Pharmacist

## 2014-01-25 DIAGNOSIS — Z7901 Long term (current) use of anticoagulants: Secondary | ICD-10-CM

## 2014-01-25 DIAGNOSIS — I4891 Unspecified atrial fibrillation: Secondary | ICD-10-CM

## 2014-01-25 DIAGNOSIS — Z5181 Encounter for therapeutic drug level monitoring: Secondary | ICD-10-CM

## 2014-01-25 LAB — POCT INR: INR: 2.1

## 2014-02-15 ENCOUNTER — Encounter: Payer: 59 | Admitting: *Deleted

## 2014-02-15 ENCOUNTER — Telehealth: Payer: Self-pay | Admitting: Cardiology

## 2014-02-15 NOTE — Telephone Encounter (Signed)
Spoke with pt and reminded pt of remote transmission that is due today. Pt verbalized understanding.   

## 2014-02-16 ENCOUNTER — Encounter: Payer: Self-pay | Admitting: Cardiology

## 2014-03-08 ENCOUNTER — Ambulatory Visit (INDEPENDENT_AMBULATORY_CARE_PROVIDER_SITE_OTHER): Payer: 59 | Admitting: *Deleted

## 2014-03-08 DIAGNOSIS — I4891 Unspecified atrial fibrillation: Secondary | ICD-10-CM

## 2014-03-08 DIAGNOSIS — Z5181 Encounter for therapeutic drug level monitoring: Secondary | ICD-10-CM

## 2014-03-08 DIAGNOSIS — Z7901 Long term (current) use of anticoagulants: Secondary | ICD-10-CM

## 2014-03-08 LAB — POCT INR: INR: 1.3

## 2014-03-27 ENCOUNTER — Ambulatory Visit (INDEPENDENT_AMBULATORY_CARE_PROVIDER_SITE_OTHER): Payer: 59 | Admitting: Pharmacist Clinician (PhC)/ Clinical Pharmacy Specialist

## 2014-03-27 DIAGNOSIS — I4891 Unspecified atrial fibrillation: Secondary | ICD-10-CM

## 2014-03-27 DIAGNOSIS — Z5181 Encounter for therapeutic drug level monitoring: Secondary | ICD-10-CM

## 2014-03-27 DIAGNOSIS — Z7901 Long term (current) use of anticoagulants: Secondary | ICD-10-CM

## 2014-03-27 LAB — POCT INR: INR: 1.8

## 2014-04-10 ENCOUNTER — Ambulatory Visit (INDEPENDENT_AMBULATORY_CARE_PROVIDER_SITE_OTHER): Payer: 59 | Admitting: *Deleted

## 2014-04-10 DIAGNOSIS — Z5181 Encounter for therapeutic drug level monitoring: Secondary | ICD-10-CM

## 2014-04-10 LAB — POCT INR: INR: 1.6

## 2014-04-17 ENCOUNTER — Other Ambulatory Visit: Payer: Self-pay | Admitting: Internal Medicine

## 2014-04-27 ENCOUNTER — Ambulatory Visit (INDEPENDENT_AMBULATORY_CARE_PROVIDER_SITE_OTHER): Payer: 59

## 2014-04-27 DIAGNOSIS — Z7901 Long term (current) use of anticoagulants: Secondary | ICD-10-CM

## 2014-04-27 DIAGNOSIS — Z5181 Encounter for therapeutic drug level monitoring: Secondary | ICD-10-CM

## 2014-04-27 DIAGNOSIS — I4891 Unspecified atrial fibrillation: Secondary | ICD-10-CM

## 2014-04-27 LAB — POCT INR: INR: 2.1

## 2014-05-01 ENCOUNTER — Encounter: Payer: Self-pay | Admitting: *Deleted

## 2014-05-09 ENCOUNTER — Encounter: Payer: Self-pay | Admitting: Cardiology

## 2014-05-16 ENCOUNTER — Ambulatory Visit (INDEPENDENT_AMBULATORY_CARE_PROVIDER_SITE_OTHER): Payer: 59 | Admitting: *Deleted

## 2014-05-16 DIAGNOSIS — I4891 Unspecified atrial fibrillation: Secondary | ICD-10-CM

## 2014-05-16 DIAGNOSIS — Z5181 Encounter for therapeutic drug level monitoring: Secondary | ICD-10-CM

## 2014-05-16 DIAGNOSIS — Z7901 Long term (current) use of anticoagulants: Secondary | ICD-10-CM

## 2014-05-16 LAB — MDC_IDC_ENUM_SESS_TYPE_INCLINIC
Battery Remaining Longevity: 1138 mo
Brady Statistic RA Percent Paced: 8 %
Lead Channel Impedance Value: 483 Ohm
Lead Channel Pacing Threshold Amplitude: 0.7 V
Lead Channel Pacing Threshold Amplitude: 1.2 V
Lead Channel Pacing Threshold Pulse Width: 0.4 ms
Lead Channel Sensing Intrinsic Amplitude: 1.1 mV
Lead Channel Sensing Intrinsic Amplitude: 11.1 mV
Lead Channel Setting Pacing Pulse Width: 0.4 ms
MDC IDC MSMT LEADCHNL RA PACING THRESHOLD PULSEWIDTH: 0.4 ms
MDC IDC MSMT LEADCHNL RV IMPEDANCE VALUE: 812 Ohm
MDC IDC PG SERIAL: 389910
MDC IDC SESS DTM: 20151021040000
MDC IDC SET LEADCHNL RA PACING AMPLITUDE: 2.4 V
MDC IDC SET LEADCHNL RV PACING AMPLITUDE: 2.4 V
MDC IDC SET LEADCHNL RV SENSING SENSITIVITY: 2.5 mV
MDC IDC STAT BRADY RV PERCENT PACED: 12 %
Zone Setting Detection Interval: 375 ms

## 2014-05-16 LAB — POCT INR: INR: 2

## 2014-05-16 NOTE — Progress Notes (Signed)
Pacemaker check in clinic. Normal device function. Thresholds, sensing, impedances consistent with previous measurements. Device programmed to maximize longevity. A-fib with RVR, + coumadin.   Device programmed at appropriate safety margins. Histogram distribution appropriate for patient activity level. Device programmed to optimize intrinsic conduction. Estimated longevity 11.5 years.  Patient education completed.  ROV in January with Dr. Lovena Le.

## 2014-06-01 ENCOUNTER — Ambulatory Visit (INDEPENDENT_AMBULATORY_CARE_PROVIDER_SITE_OTHER): Payer: 59 | Admitting: Pharmacist

## 2014-06-01 DIAGNOSIS — Z7901 Long term (current) use of anticoagulants: Secondary | ICD-10-CM

## 2014-06-01 DIAGNOSIS — Z5181 Encounter for therapeutic drug level monitoring: Secondary | ICD-10-CM

## 2014-06-01 DIAGNOSIS — I4891 Unspecified atrial fibrillation: Secondary | ICD-10-CM

## 2014-06-01 LAB — POCT INR: INR: 2.4

## 2014-06-13 ENCOUNTER — Encounter: Payer: Self-pay | Admitting: Internal Medicine

## 2014-06-29 ENCOUNTER — Ambulatory Visit (INDEPENDENT_AMBULATORY_CARE_PROVIDER_SITE_OTHER): Payer: 59 | Admitting: Pharmacist

## 2014-06-29 DIAGNOSIS — Z7901 Long term (current) use of anticoagulants: Secondary | ICD-10-CM

## 2014-06-29 DIAGNOSIS — I4891 Unspecified atrial fibrillation: Secondary | ICD-10-CM

## 2014-06-29 DIAGNOSIS — Z5181 Encounter for therapeutic drug level monitoring: Secondary | ICD-10-CM

## 2014-06-29 LAB — POCT INR: INR: 1.9

## 2014-07-05 ENCOUNTER — Encounter (HOSPITAL_COMMUNITY): Payer: Self-pay | Admitting: Internal Medicine

## 2014-07-09 ENCOUNTER — Telehealth: Payer: Self-pay | Admitting: Internal Medicine

## 2014-07-09 NOTE — Telephone Encounter (Signed)
New message     Pt is on coumadin.  [facility] cyanocobalamin 500mg , lisinopril 5mg  and thiamine HCL 100mg .  Is this ok to take with the coumadin?

## 2014-07-09 NOTE — Telephone Encounter (Signed)
Telephoned pt and informed him that these meds don't interact with Coumadin.

## 2014-07-31 ENCOUNTER — Ambulatory Visit (INDEPENDENT_AMBULATORY_CARE_PROVIDER_SITE_OTHER): Payer: 59 | Admitting: Pharmacist

## 2014-07-31 DIAGNOSIS — Z7901 Long term (current) use of anticoagulants: Secondary | ICD-10-CM

## 2014-07-31 DIAGNOSIS — Z5181 Encounter for therapeutic drug level monitoring: Secondary | ICD-10-CM

## 2014-07-31 DIAGNOSIS — I4891 Unspecified atrial fibrillation: Secondary | ICD-10-CM

## 2014-07-31 LAB — POCT INR: INR: 1.8

## 2014-08-07 ENCOUNTER — Ambulatory Visit (INDEPENDENT_AMBULATORY_CARE_PROVIDER_SITE_OTHER): Payer: 59 | Admitting: Internal Medicine

## 2014-08-07 ENCOUNTER — Encounter: Payer: Self-pay | Admitting: Internal Medicine

## 2014-08-07 VITALS — BP 124/84 | HR 60 | Ht 64.0 in | Wt 169.0 lb

## 2014-08-07 DIAGNOSIS — Z950 Presence of cardiac pacemaker: Secondary | ICD-10-CM

## 2014-08-07 DIAGNOSIS — I10 Essential (primary) hypertension: Secondary | ICD-10-CM

## 2014-08-07 DIAGNOSIS — I482 Chronic atrial fibrillation, unspecified: Secondary | ICD-10-CM

## 2014-08-07 DIAGNOSIS — I495 Sick sinus syndrome: Secondary | ICD-10-CM

## 2014-08-07 LAB — MDC_IDC_ENUM_SESS_TYPE_INCLINIC
Brady Statistic RA Percent Paced: 4 %
Lead Channel Impedance Value: 452 Ohm
Lead Channel Pacing Threshold Amplitude: 0.6 V
Lead Channel Sensing Intrinsic Amplitude: 1.5 mV
Lead Channel Setting Pacing Amplitude: 2.4 V
Lead Channel Setting Pacing Amplitude: 2.4 V
Lead Channel Setting Sensing Sensitivity: 2.5 mV
MDC IDC MSMT BATTERY REMAINING LONGEVITY: 120 mo
MDC IDC MSMT LEADCHNL RA PACING THRESHOLD AMPLITUDE: 1.2 V
MDC IDC MSMT LEADCHNL RA PACING THRESHOLD PULSEWIDTH: 0.4 ms
MDC IDC MSMT LEADCHNL RV IMPEDANCE VALUE: 854 Ohm
MDC IDC MSMT LEADCHNL RV PACING THRESHOLD PULSEWIDTH: 0.4 ms
MDC IDC MSMT LEADCHNL RV SENSING INTR AMPL: 8.7 mV
MDC IDC PG SERIAL: 389910
MDC IDC SESS DTM: 20160112050000
MDC IDC SET LEADCHNL RV PACING PULSEWIDTH: 0.4 ms
MDC IDC SET ZONE DETECTION INTERVAL: 375 ms
MDC IDC STAT BRADY RV PERCENT PACED: 1 %

## 2014-08-07 MED ORDER — DIGOXIN 125 MCG PO TABS: 0.1250 mg | ORAL_TABLET | Freq: Every day | ORAL | Status: DC

## 2014-08-07 NOTE — Progress Notes (Signed)
HPI [name] returns today for followup. He is a very pleasant 69 year old man with a history of paroxysmal, now persistent atrial fibrillation, symptomatic tachybrady syndrome, status post permanent pacemaker insertion. He had been nicely controlled in sinus rhythm on amiodarone but over the past several months developed recurrent persistent atrial fib for which he is minimally symptomatic.  He has no palpitations. He denies chest pain or shortness of breath. No peripheral edema. No cough or hemoptysis. No nausea or vomiting. No Known Allergies   Current Outpatient Prescriptions  Medication Sig Dispense Refill  . Calcium Carbonate-Vitamin D (CALCIUM 500/D) 500-125 MG-UNIT TABS Take 1 tablet by mouth 2 (two) times daily.     . hydrochlorothiazide 25 MG tablet Take 25 mg by mouth daily.      Marland Kitchen lisinopril (PRINIVIL,ZESTRIL) 5 MG tablet Take 5 mg by mouth daily. Take 1/2 tablet daily    . pravastatin (PRAVACHOL) 80 MG tablet Take 80 mg by mouth daily.     . sertraline (ZOLOFT) 100 MG tablet Take 100 mg by mouth daily.      Marland Kitchen thiamine 100 MG tablet Take 100 mg by mouth daily.    . traZODone (DESYREL) 100 MG tablet Take 50-100 mg by mouth at bedtime.     Marland Kitchen VIAGRA 25 MG tablet As directed    . vitamin B-12 (CYANOCOBALAMIN) 500 MCG tablet Take 500 mcg by mouth 2 (two) times daily.    Marland Kitchen warfarin (COUMADIN) 2 MG tablet TAKE AS DIRECTED BY COUMADIN CLINIC 40 tablet 3   No current facility-administered medications for this visit.     Past Medical History  Diagnosis Date  . Hypertension   . Hyperlipidemia   . Anxiety   . Coronary artery disease   . Atrial fibrillation   . Atrial flutter   . Sinus bradycardia     ROS:   All systems reviewed and negative except as noted in the HPI.   Past Surgical History  Procedure Laterality Date  . Hernia repair    . Cataract extraction    . Replacement total knee      right knee  . Thumb surgery      left and right  . Permanent pacemaker  insertion  2005; 08/07/2013    BSX dual chamber pacemaker implanted 2005 for symptomatic bradycardia; gen change 2015 by Dr Phillip Kline  . Permanent pacemaker generator change N/A 08/07/2013    Procedure: PERMANENT PACEMAKER GENERATOR CHANGE;  Surgeon: Phillip Lance, MD;  Location: [facility] CATH LAB;  Service: Cardiovascular;  Laterality: N/A;     Family History  Problem Relation Age of Onset  . Cancer Father     lung cancer     History   Social History  . Marital Status: Single    Spouse Name: N/A    Number of Children: N/A  . Years of Education: N/A   Occupational History  . Not on file.   Social History Main Topics  . Smoking status: Never Smoker   . Smokeless tobacco: Not on file  . Alcohol Use: Yes     Comment: rarely  . Drug Use: Yes     Comment: rare marijuana   . Sexual Activity: Not on file   Other Topics Concern  . Not on file   Social History Narrative     BP 124/84 mmHg  Pulse 60  Ht 5\' 4"  (1.626 m)  Wt 169 lb (76.658 kg)  BMI 28.99 kg/m2  Physical Exam:  Well appearing middle-aged man, NAD  HEENT: Unremarkable Neck:  No JVD, no thyromegally Lungs:  Clear with no wheezes, rales, or rhonchi. HEART:  IRegular rate rhythm, no murmurs, no rubs, no clicks Abd:  soft, positive bowel sounds, no organomegally, no rebound, no guarding Ext:  2 plus pulses, no edema, no cyanosis, no clubbing Skin:  No rashes no nodules Neuro:  CN II through XII intact, motor grossly intact  DEVICE  Normal device function.  See PaceArt for details.   Assess/Plan:

## 2014-08-07 NOTE — Assessment & Plan Note (Signed)
He will continue high dose pravachol and maintain a low fat diet.

## 2014-08-07 NOTE — Assessment & Plan Note (Signed)
His blood pressure is well controlled. He will continue his current meds and maintain a low sodium diet. 

## 2014-08-07 NOTE — Assessment & Plan Note (Signed)
His [employer] DDD PM is working normally. Will recheck in several months.

## 2014-08-07 NOTE — Patient Instructions (Signed)
Your physician wants you to follow-up in: 6 months with Dr. Knox Saliva will receive a reminder letter in the mail two months in advance. If you don't receive a letter, please call our office to schedule the follow-up appointment.  Your physician recommends that you return for lab work in: 2 weeks for a Digoxin level   Your physician has recommended you make the following change in your medication:  1) Start Digoxin 0.125 mg daily

## 2014-08-07 NOTE — Assessment & Plan Note (Signed)
His ventricular rate is not well controlled. I have asked the patient to start digoxin. We also discussed restarting amio vs trying Tikosyn but he prefers a strategy of rate control.

## 2014-08-16 ENCOUNTER — Encounter: Payer: Self-pay | Admitting: Internal Medicine

## 2014-08-16 ENCOUNTER — Ambulatory Visit (INDEPENDENT_AMBULATORY_CARE_PROVIDER_SITE_OTHER): Payer: 59 | Admitting: *Deleted

## 2014-08-16 DIAGNOSIS — I4891 Unspecified atrial fibrillation: Secondary | ICD-10-CM

## 2014-08-16 DIAGNOSIS — Z5181 Encounter for therapeutic drug level monitoring: Secondary | ICD-10-CM

## 2014-08-16 DIAGNOSIS — Z7901 Long term (current) use of anticoagulants: Secondary | ICD-10-CM

## 2014-08-16 DIAGNOSIS — I482 Chronic atrial fibrillation, unspecified: Secondary | ICD-10-CM

## 2014-08-16 LAB — POCT INR: INR: 2.2

## 2014-08-21 ENCOUNTER — Other Ambulatory Visit (INDEPENDENT_AMBULATORY_CARE_PROVIDER_SITE_OTHER): Payer: 59 | Admitting: *Deleted

## 2014-08-21 DIAGNOSIS — I495 Sick sinus syndrome: Secondary | ICD-10-CM

## 2014-08-22 ENCOUNTER — Other Ambulatory Visit: Payer: Self-pay | Admitting: *Deleted

## 2014-08-22 LAB — DIGOXIN LEVEL: Digoxin Level: 1.5 ng/mL (ref 0.8–2.0)

## 2014-08-22 MED ORDER — WARFARIN SODIUM 2 MG PO TABS: ORAL_TABLET | ORAL | Status: DC

## 2014-08-30 ENCOUNTER — Ambulatory Visit (INDEPENDENT_AMBULATORY_CARE_PROVIDER_SITE_OTHER): Payer: 59 | Admitting: *Deleted

## 2014-08-30 DIAGNOSIS — Z7901 Long term (current) use of anticoagulants: Secondary | ICD-10-CM

## 2014-08-30 DIAGNOSIS — I482 Chronic atrial fibrillation, unspecified: Secondary | ICD-10-CM

## 2014-08-30 DIAGNOSIS — Z5181 Encounter for therapeutic drug level monitoring: Secondary | ICD-10-CM

## 2014-08-30 DIAGNOSIS — I4891 Unspecified atrial fibrillation: Secondary | ICD-10-CM

## 2014-08-30 LAB — POCT INR: INR: 2.4

## 2014-09-27 ENCOUNTER — Ambulatory Visit (INDEPENDENT_AMBULATORY_CARE_PROVIDER_SITE_OTHER): Payer: 59 | Admitting: *Deleted

## 2014-09-27 DIAGNOSIS — I482 Chronic atrial fibrillation, unspecified: Secondary | ICD-10-CM

## 2014-09-27 DIAGNOSIS — I4891 Unspecified atrial fibrillation: Secondary | ICD-10-CM

## 2014-09-27 DIAGNOSIS — Z7901 Long term (current) use of anticoagulants: Secondary | ICD-10-CM

## 2014-09-27 DIAGNOSIS — Z5181 Encounter for therapeutic drug level monitoring: Secondary | ICD-10-CM

## 2014-09-27 LAB — POCT INR: INR: 1.6

## 2014-10-11 ENCOUNTER — Ambulatory Visit (INDEPENDENT_AMBULATORY_CARE_PROVIDER_SITE_OTHER): Payer: 59 | Admitting: *Deleted

## 2014-10-11 DIAGNOSIS — I4891 Unspecified atrial fibrillation: Secondary | ICD-10-CM

## 2014-10-11 DIAGNOSIS — I482 Chronic atrial fibrillation, unspecified: Secondary | ICD-10-CM

## 2014-10-11 DIAGNOSIS — Z7901 Long term (current) use of anticoagulants: Secondary | ICD-10-CM

## 2014-10-11 DIAGNOSIS — Z5181 Encounter for therapeutic drug level monitoring: Secondary | ICD-10-CM

## 2014-10-11 LAB — POCT INR: INR: 1.5

## 2014-10-25 ENCOUNTER — Ambulatory Visit (INDEPENDENT_AMBULATORY_CARE_PROVIDER_SITE_OTHER): Payer: 59 | Admitting: *Deleted

## 2014-10-25 DIAGNOSIS — Z5181 Encounter for therapeutic drug level monitoring: Secondary | ICD-10-CM

## 2014-10-25 DIAGNOSIS — I4891 Unspecified atrial fibrillation: Secondary | ICD-10-CM

## 2014-10-25 DIAGNOSIS — I482 Chronic atrial fibrillation, unspecified: Secondary | ICD-10-CM

## 2014-10-25 DIAGNOSIS — Z7901 Long term (current) use of anticoagulants: Secondary | ICD-10-CM | POA: Diagnosis not present

## 2014-10-25 LAB — POCT INR: INR: 1.4

## 2014-11-08 ENCOUNTER — Ambulatory Visit (INDEPENDENT_AMBULATORY_CARE_PROVIDER_SITE_OTHER): Payer: 59 | Admitting: Surgery

## 2014-11-08 DIAGNOSIS — Z7901 Long term (current) use of anticoagulants: Secondary | ICD-10-CM | POA: Diagnosis not present

## 2014-11-08 DIAGNOSIS — I482 Chronic atrial fibrillation, unspecified: Secondary | ICD-10-CM

## 2014-11-08 DIAGNOSIS — I4891 Unspecified atrial fibrillation: Secondary | ICD-10-CM

## 2014-11-08 DIAGNOSIS — Z5181 Encounter for therapeutic drug level monitoring: Secondary | ICD-10-CM | POA: Diagnosis not present

## 2014-11-08 LAB — POCT INR: INR: 1.8

## 2014-11-22 ENCOUNTER — Ambulatory Visit (INDEPENDENT_AMBULATORY_CARE_PROVIDER_SITE_OTHER): Payer: 59

## 2014-11-22 DIAGNOSIS — I4891 Unspecified atrial fibrillation: Secondary | ICD-10-CM

## 2014-11-22 DIAGNOSIS — Z5181 Encounter for therapeutic drug level monitoring: Secondary | ICD-10-CM | POA: Diagnosis not present

## 2014-11-22 DIAGNOSIS — Z7901 Long term (current) use of anticoagulants: Secondary | ICD-10-CM | POA: Diagnosis not present

## 2014-11-22 LAB — POCT INR: INR: 2.3

## 2014-12-05 ENCOUNTER — Other Ambulatory Visit: Payer: Self-pay | Admitting: *Deleted

## 2014-12-05 MED ORDER — WARFARIN SODIUM 2 MG PO TABS: ORAL_TABLET | ORAL | Status: DC

## 2014-12-05 NOTE — Telephone Encounter (Signed)
Refill done as requested 

## 2014-12-20 ENCOUNTER — Ambulatory Visit (INDEPENDENT_AMBULATORY_CARE_PROVIDER_SITE_OTHER): Payer: 59

## 2014-12-20 DIAGNOSIS — I4891 Unspecified atrial fibrillation: Secondary | ICD-10-CM | POA: Diagnosis not present

## 2014-12-20 DIAGNOSIS — Z7901 Long term (current) use of anticoagulants: Secondary | ICD-10-CM | POA: Diagnosis not present

## 2014-12-20 DIAGNOSIS — Z5181 Encounter for therapeutic drug level monitoring: Secondary | ICD-10-CM

## 2014-12-20 LAB — POCT INR: INR: 2

## 2015-01-11 ENCOUNTER — Telehealth: Payer: Self-pay | Admitting: Internal Medicine

## 2015-01-11 NOTE — Telephone Encounter (Signed)
New Message    Tiffany form the office of Lisa Roca; [employer] [facility]. She is calling in to confirm   That her fax was accepted for surgical clearance and for medication to stop fax sentsent 01-09-15   :  Warfarin 2mg      Date is not scheduled yet until clearance is proccessed  Please give her a call

## 2015-01-11 NOTE — Telephone Encounter (Signed)
Form is here and in Dr Tanna Furry folder to address

## 2015-01-17 ENCOUNTER — Ambulatory Visit (INDEPENDENT_AMBULATORY_CARE_PROVIDER_SITE_OTHER): Payer: 59 | Admitting: *Deleted

## 2015-01-17 DIAGNOSIS — I4891 Unspecified atrial fibrillation: Secondary | ICD-10-CM

## 2015-01-17 DIAGNOSIS — Z5181 Encounter for therapeutic drug level monitoring: Secondary | ICD-10-CM

## 2015-01-17 DIAGNOSIS — Z7901 Long term (current) use of anticoagulants: Secondary | ICD-10-CM

## 2015-01-17 LAB — POCT INR: INR: 2.1

## 2015-01-17 NOTE — Patient Instructions (Addendum)
Take your last dose of Coumadin on 01/19/15 Procedure date is 01/24/15 Resume coumadin when instructed to do so by Neurosurgeon. Resume at normal dose.

## 2015-02-04 ENCOUNTER — Telehealth: Payer: Self-pay | Admitting: Internal Medicine

## 2015-02-04 ENCOUNTER — Ambulatory Visit (INDEPENDENT_AMBULATORY_CARE_PROVIDER_SITE_OTHER): Payer: 59 | Admitting: *Deleted

## 2015-02-04 DIAGNOSIS — I4891 Unspecified atrial fibrillation: Secondary | ICD-10-CM | POA: Diagnosis not present

## 2015-02-04 DIAGNOSIS — Z5181 Encounter for therapeutic drug level monitoring: Secondary | ICD-10-CM | POA: Diagnosis not present

## 2015-02-04 DIAGNOSIS — Z7901 Long term (current) use of anticoagulants: Secondary | ICD-10-CM

## 2015-02-04 LAB — POCT INR: INR: 1.4

## 2015-02-04 NOTE — Telephone Encounter (Signed)
Message received  Patient states that he was taken off [facility]ol by [facility] and started on Atorvastain 80 mg daily

## 2015-02-04 NOTE — Telephone Encounter (Signed)
New Message    Pt wants to speak to Rn about his list of medications being wrong

## 2015-02-15 ENCOUNTER — Encounter: Payer: Self-pay | Admitting: Internal Medicine

## 2015-02-15 ENCOUNTER — Ambulatory Visit (INDEPENDENT_AMBULATORY_CARE_PROVIDER_SITE_OTHER): Payer: 59

## 2015-02-15 ENCOUNTER — Ambulatory Visit (INDEPENDENT_AMBULATORY_CARE_PROVIDER_SITE_OTHER): Payer: 59 | Admitting: Internal Medicine

## 2015-02-15 VITALS — BP 114/80 | HR 115 | Ht 60.0 in | Wt 162.0 lb

## 2015-02-15 DIAGNOSIS — I4891 Unspecified atrial fibrillation: Secondary | ICD-10-CM

## 2015-02-15 DIAGNOSIS — Z950 Presence of cardiac pacemaker: Secondary | ICD-10-CM | POA: Diagnosis not present

## 2015-02-15 DIAGNOSIS — I2510 Atherosclerotic heart disease of native coronary artery without angina pectoris: Secondary | ICD-10-CM | POA: Diagnosis not present

## 2015-02-15 DIAGNOSIS — I482 Chronic atrial fibrillation, unspecified: Secondary | ICD-10-CM

## 2015-02-15 DIAGNOSIS — Z7901 Long term (current) use of anticoagulants: Secondary | ICD-10-CM | POA: Diagnosis not present

## 2015-02-15 DIAGNOSIS — Z5181 Encounter for therapeutic drug level monitoring: Secondary | ICD-10-CM

## 2015-02-15 LAB — CUP PACEART INCLINIC DEVICE CHECK
Brady Statistic RA Percent Paced: 2 %
Brady Statistic RV Percent Paced: 3 %
Date Time Interrogation Session: 20160722040000
Lead Channel Impedance Value: 474 Ohm
Lead Channel Impedance Value: 880 Ohm
Lead Channel Pacing Threshold Amplitude: 0.7 V
Lead Channel Sensing Intrinsic Amplitude: 7.5 mV
Lead Channel Setting Pacing Amplitude: 2.4 V
Lead Channel Setting Pacing Pulse Width: 0.4 ms
Lead Channel Setting Sensing Sensitivity: 2.5 mV
MDC IDC MSMT LEADCHNL RA SENSING INTR AMPL: 0.8 mV
MDC IDC MSMT LEADCHNL RV PACING THRESHOLD PULSEWIDTH: 0.4 ms
MDC IDC PG SERIAL: 389910
MDC IDC SET LEADCHNL RV PACING AMPLITUDE: 2.4 V
MDC IDC SET ZONE DETECTION INTERVAL: 375 ms

## 2015-02-15 LAB — POCT INR: INR: 2.5

## 2015-02-15 MED ORDER — VERAPAMIL HCL ER 120 MG PO TBCR: 120.0000 mg | EXTENDED_RELEASE_TABLET | Freq: Every day | ORAL | Status: DC

## 2015-02-15 NOTE — Assessment & Plan Note (Signed)
His blood pressure has been well controlled. He does have some orthostasis and for this reason we will not be aggressive at trying to lower his blood pressure.

## 2015-02-15 NOTE — Assessment & Plan Note (Signed)
His [employer] PPM is working normally. Will recheck in several months.

## 2015-02-15 NOTE — Assessment & Plan Note (Signed)
He denies anginal symptoms. Will follow. 

## 2015-02-15 NOTE — Assessment & Plan Note (Signed)
His ventricular rate is not well controlled. We will add verapamil with plans to uptitrate as needed.

## 2015-02-15 NOTE — Progress Notes (Signed)
HPI Phillip Kline returns today for followup. He is a very pleasant 69 year old man with a history of paroxysmal, now chronic atrial fibrillation, symptomatic tachybrady syndrome, status post permanent pacemaker insertion. He had been nicely controlled in sinus rhythm on amiodarone but over the past several months developed recurrent persistent atrial fib for which he is minimally symptomatic.  He has no palpitations. He denies chest pain and minimal shortness of breath. No peripheral edema. No cough or hemoptysis. No nausea or vomiting. His ventricular rates have not been well controlled.  No Known Allergies   Current Outpatient Prescriptions  Medication Sig Dispense Refill  . atorvastatin (LIPITOR) 80 MG tablet Take 80 mg by mouth daily.    . Calcium Carbonate-Vitamin D (CALCIUM 500/D) 500-125 MG-UNIT TABS Take 1 tablet by mouth 2 (two) times daily.     . digoxin (LANOXIN) 0.125 MG tablet Take 1 tablet (0.125 mg total) by mouth daily. 90 tablet 3  . hydrochlorothiazide 25 MG tablet Take 25 mg by mouth daily.      . sertraline (ZOLOFT) 100 MG tablet Take 100 mg by mouth daily.      Marland Kitchen thiamine 100 MG tablet Take 100 mg by mouth daily.    . traZODone (DESYREL) 100 MG tablet Take 50-100 mg by mouth at bedtime.     Marland Kitchen VIAGRA 25 MG tablet As directed    . vitamin B-12 (CYANOCOBALAMIN) 500 MCG tablet Take 500 mcg by mouth 2 (two) times daily.    Marland Kitchen warfarin (COUMADIN) 2 MG tablet TAKE AS DIRECTED BY COUMADIN CLINIC 50 tablet 3   No current facility-administered medications for this visit.     Past Medical History  Diagnosis Date  . Hypertension   . Hyperlipidemia   . Anxiety   . Coronary artery disease   . Atrial fibrillation   . Atrial flutter   . Sinus bradycardia     ROS:   All systems reviewed and negative except as noted in the HPI.   Past Surgical History  Procedure Laterality Date  . Hernia repair    . Cataract extraction    . Replacement total knee      right knee  . Thumb  surgery      left and right  . Permanent pacemaker insertion  2005; 08/07/2013    BSX dual chamber pacemaker implanted 2005 for symptomatic bradycardia; gen change 2015 by Dr Lovena Le  . Permanent pacemaker generator change N/A 08/07/2013    Procedure: PERMANENT PACEMAKER GENERATOR CHANGE;  Surgeon: Evans Lance, MD;  Location: [facility] CATH LAB;  Service: Cardiovascular;  Laterality: N/A;     Family History  Problem Relation Age of Onset  . Cancer Father     lung cancer     History   Social History  . Marital Status: Single    Spouse Name: N/A  . Number of Children: N/A  . Years of Education: N/A   Occupational History  . Not on file.   Social History Main Topics  . Smoking status: Never Smoker   . Smokeless tobacco: Not on file  . Alcohol Use: Yes     Comment: rarely  . Drug Use: Yes     Comment: rare marijuana   . Sexual Activity: Not on file   Other Topics Concern  . Not on file   Social History Narrative     BP 114/80 mmHg  Pulse 115  Ht 5' (1.524 m)  Wt 162 lb (73.483 kg)  BMI 31.64 kg/m2  Physical  Exam:  Well appearing middle-aged man, NAD HEENT: Unremarkable Neck:  No JVD, no thyromegally Lungs:  Clear with no wheezes, rales, or rhonchi. HEART:  IRegular rate rhythm, no murmurs, no rubs, no clicks Abd:  soft, positive bowel sounds, no organomegally, no rebound, no guarding Ext:  2 plus pulses, no edema, no cyanosis, no clubbing Skin:  No rashes no nodules Neuro:  CN II through XII intact, motor grossly intact  DEVICE  Normal device function.  See PaceArt for details.   Assess/Plan:

## 2015-02-15 NOTE — Patient Instructions (Addendum)
Medication Instructions:  Your physician has recommended you make the following change in your medication:  1) Start Verapamil 120 mg daily   Labwork: None ordered  Testing/Procedures: None ordered  Follow-Up: Your physician wants you to follow-up in: 6 months with Dr Knox Saliva will receive a reminder letter in the mail two months in advance. If you don't receive a letter, please call our office to schedule the follow-up appointment.   Any Other Special Instructions Will Be Listed Below (If Applicable).  Cleared for surgery from a cardiac standpoint

## 2015-02-22 ENCOUNTER — Telehealth: Payer: Self-pay | Admitting: Internal Medicine

## 2015-02-22 NOTE — Telephone Encounter (Signed)
Request for surgical clearance:  1. What type of surgery is being performed? cervical (neck) surgery   2. When is this surgery scheduled? 03/11/2015  3. Are there any medications that need to be held prior to surgery and how long? Coumadin 5 days??? Or whatever the prescriber recommends   4. Name of physician performing surgery? Dr.  Radonna Ricker  5. What is your office phone and fax number? Fax [phone]  Comments: 1st clearance requests was sent on the 02/14/2015 will fax another.

## 2015-02-25 NOTE — Telephone Encounter (Signed)
Pt has a CHADS score of 1.  Okay to hold Coumadin x  5 days prior to procedure.

## 2015-03-14 ENCOUNTER — Telehealth: Payer: Self-pay | Admitting: Internal Medicine

## 2015-03-14 NOTE — Telephone Encounter (Signed)
New message      Pt is returning a nurses call

## 2015-03-14 NOTE — Telephone Encounter (Signed)
Spoke with patient and let him know I had not called him  He says there was no message left just saw on his caller ID

## 2015-03-19 ENCOUNTER — Ambulatory Visit (INDEPENDENT_AMBULATORY_CARE_PROVIDER_SITE_OTHER): Payer: 59 | Admitting: Pharmacist

## 2015-03-19 DIAGNOSIS — I4891 Unspecified atrial fibrillation: Secondary | ICD-10-CM

## 2015-03-19 DIAGNOSIS — Z7901 Long term (current) use of anticoagulants: Secondary | ICD-10-CM | POA: Diagnosis not present

## 2015-03-19 DIAGNOSIS — Z5181 Encounter for therapeutic drug level monitoring: Secondary | ICD-10-CM

## 2015-03-19 DIAGNOSIS — I482 Chronic atrial fibrillation, unspecified: Secondary | ICD-10-CM

## 2015-03-19 LAB — POCT INR: INR: 1.7

## 2015-04-02 ENCOUNTER — Ambulatory Visit (INDEPENDENT_AMBULATORY_CARE_PROVIDER_SITE_OTHER): Payer: 59 | Admitting: *Deleted

## 2015-04-02 DIAGNOSIS — Z5181 Encounter for therapeutic drug level monitoring: Secondary | ICD-10-CM

## 2015-04-02 DIAGNOSIS — I482 Chronic atrial fibrillation, unspecified: Secondary | ICD-10-CM

## 2015-04-02 DIAGNOSIS — I4891 Unspecified atrial fibrillation: Secondary | ICD-10-CM

## 2015-04-02 DIAGNOSIS — Z7901 Long term (current) use of anticoagulants: Secondary | ICD-10-CM

## 2015-04-02 LAB — POCT INR: INR: 3.1

## 2015-04-24 ENCOUNTER — Ambulatory Visit (INDEPENDENT_AMBULATORY_CARE_PROVIDER_SITE_OTHER): Payer: 59 | Admitting: Pharmacist

## 2015-04-24 DIAGNOSIS — I482 Chronic atrial fibrillation, unspecified: Secondary | ICD-10-CM

## 2015-04-24 DIAGNOSIS — Z5181 Encounter for therapeutic drug level monitoring: Secondary | ICD-10-CM | POA: Diagnosis not present

## 2015-04-24 DIAGNOSIS — I4891 Unspecified atrial fibrillation: Secondary | ICD-10-CM

## 2015-04-24 DIAGNOSIS — Z7901 Long term (current) use of anticoagulants: Secondary | ICD-10-CM

## 2015-04-24 LAB — POCT INR: INR: 3.4

## 2015-05-08 ENCOUNTER — Ambulatory Visit (INDEPENDENT_AMBULATORY_CARE_PROVIDER_SITE_OTHER): Payer: 59 | Admitting: *Deleted

## 2015-05-08 DIAGNOSIS — Z7901 Long term (current) use of anticoagulants: Secondary | ICD-10-CM | POA: Diagnosis not present

## 2015-05-08 DIAGNOSIS — Z5181 Encounter for therapeutic drug level monitoring: Secondary | ICD-10-CM | POA: Diagnosis not present

## 2015-05-08 DIAGNOSIS — I482 Chronic atrial fibrillation, unspecified: Secondary | ICD-10-CM

## 2015-05-08 DIAGNOSIS — I4891 Unspecified atrial fibrillation: Secondary | ICD-10-CM | POA: Diagnosis not present

## 2015-05-08 LAB — POCT INR: INR: 3.4

## 2015-05-21 ENCOUNTER — Ambulatory Visit (INDEPENDENT_AMBULATORY_CARE_PROVIDER_SITE_OTHER): Payer: 59

## 2015-05-21 DIAGNOSIS — Z7901 Long term (current) use of anticoagulants: Secondary | ICD-10-CM | POA: Diagnosis not present

## 2015-05-21 DIAGNOSIS — Z5181 Encounter for therapeutic drug level monitoring: Secondary | ICD-10-CM | POA: Diagnosis not present

## 2015-05-21 DIAGNOSIS — I482 Chronic atrial fibrillation, unspecified: Secondary | ICD-10-CM

## 2015-05-21 DIAGNOSIS — I4891 Unspecified atrial fibrillation: Secondary | ICD-10-CM | POA: Diagnosis not present

## 2015-05-21 LAB — POCT INR: INR: 2.1

## 2015-05-31 ENCOUNTER — Other Ambulatory Visit: Payer: Self-pay | Admitting: Internal Medicine

## 2015-06-11 ENCOUNTER — Ambulatory Visit (INDEPENDENT_AMBULATORY_CARE_PROVIDER_SITE_OTHER): Payer: 59 | Admitting: *Deleted

## 2015-06-11 DIAGNOSIS — Z7901 Long term (current) use of anticoagulants: Secondary | ICD-10-CM | POA: Diagnosis not present

## 2015-06-11 DIAGNOSIS — I4891 Unspecified atrial fibrillation: Secondary | ICD-10-CM

## 2015-06-11 DIAGNOSIS — I482 Chronic atrial fibrillation, unspecified: Secondary | ICD-10-CM

## 2015-06-11 DIAGNOSIS — Z5181 Encounter for therapeutic drug level monitoring: Secondary | ICD-10-CM | POA: Diagnosis not present

## 2015-06-11 LAB — POCT INR: INR: 2.1

## 2015-07-09 ENCOUNTER — Telehealth: Payer: Self-pay | Admitting: Internal Medicine

## 2015-07-09 ENCOUNTER — Ambulatory Visit (INDEPENDENT_AMBULATORY_CARE_PROVIDER_SITE_OTHER): Payer: 59 | Admitting: *Deleted

## 2015-07-09 DIAGNOSIS — I4891 Unspecified atrial fibrillation: Secondary | ICD-10-CM | POA: Diagnosis not present

## 2015-07-09 DIAGNOSIS — Z7901 Long term (current) use of anticoagulants: Secondary | ICD-10-CM | POA: Diagnosis not present

## 2015-07-09 DIAGNOSIS — I482 Chronic atrial fibrillation, unspecified: Secondary | ICD-10-CM

## 2015-07-09 DIAGNOSIS — Z5181 Encounter for therapeutic drug level monitoring: Secondary | ICD-10-CM | POA: Diagnosis not present

## 2015-07-09 LAB — POCT INR: INR: 2.2

## 2015-07-09 NOTE — Telephone Encounter (Signed)
Received call from front desk and pt walked into office stating he was here to pick up letter.  Phillip Kline desk staff talked with pt and he does not need letter today.  They will complete walk in pt form to be given to Dr. Tanna Furry nurse upon her return to office tomorrow.

## 2015-07-09 NOTE — Telephone Encounter (Signed)
New Message   Pt wants a letter with his heart diagnosis for the [facility]

## 2015-07-09 NOTE — Telephone Encounter (Signed)
Walk in pt form-pt needs letter describing his heart disease, he will pick up once completed-Kelly back Wednesday 12/14.

## 2015-07-12 NOTE — Telephone Encounter (Addendum)
Dr Lovena Le has the request and will try to complete today.  Spoke with patient and he says last office note would work.  I have prionted and it is out front for him to pick up

## 2015-07-12 NOTE — Telephone Encounter (Signed)
F/u  Pt following up on letter for [place] [facility]- Please call back and discuss.

## 2015-07-30 ENCOUNTER — Other Ambulatory Visit: Payer: Self-pay | Admitting: Internal Medicine

## 2015-08-08 ENCOUNTER — Other Ambulatory Visit: Payer: Self-pay | Admitting: Internal Medicine

## 2015-08-20 ENCOUNTER — Ambulatory Visit (INDEPENDENT_AMBULATORY_CARE_PROVIDER_SITE_OTHER): Payer: 59 | Admitting: *Deleted

## 2015-08-20 DIAGNOSIS — Z7901 Long term (current) use of anticoagulants: Secondary | ICD-10-CM | POA: Diagnosis not present

## 2015-08-20 DIAGNOSIS — Z5181 Encounter for therapeutic drug level monitoring: Secondary | ICD-10-CM

## 2015-08-20 DIAGNOSIS — I482 Chronic atrial fibrillation, unspecified: Secondary | ICD-10-CM

## 2015-08-20 DIAGNOSIS — I4891 Unspecified atrial fibrillation: Secondary | ICD-10-CM | POA: Diagnosis not present

## 2015-08-20 LAB — POCT INR: INR: 1.7

## 2015-08-28 ENCOUNTER — Encounter: Payer: Self-pay | Admitting: *Deleted

## 2015-09-05 ENCOUNTER — Ambulatory Visit (INDEPENDENT_AMBULATORY_CARE_PROVIDER_SITE_OTHER): Payer: 59

## 2015-09-05 DIAGNOSIS — I482 Chronic atrial fibrillation, unspecified: Secondary | ICD-10-CM

## 2015-09-05 DIAGNOSIS — I4891 Unspecified atrial fibrillation: Secondary | ICD-10-CM

## 2015-09-05 DIAGNOSIS — Z5181 Encounter for therapeutic drug level monitoring: Secondary | ICD-10-CM | POA: Diagnosis not present

## 2015-09-05 DIAGNOSIS — Z7901 Long term (current) use of anticoagulants: Secondary | ICD-10-CM

## 2015-09-05 LAB — POCT INR: INR: 1.6

## 2015-09-18 ENCOUNTER — Other Ambulatory Visit: Payer: Self-pay

## 2015-09-19 ENCOUNTER — Ambulatory Visit (INDEPENDENT_AMBULATORY_CARE_PROVIDER_SITE_OTHER): Payer: 59 | Admitting: *Deleted

## 2015-09-19 ENCOUNTER — Ambulatory Visit (INDEPENDENT_AMBULATORY_CARE_PROVIDER_SITE_OTHER): Payer: 59 | Admitting: Internal Medicine

## 2015-09-19 ENCOUNTER — Encounter: Payer: Self-pay | Admitting: Internal Medicine

## 2015-09-19 VITALS — BP 138/82 | HR 112 | Ht 64.0 in | Wt 153.0 lb

## 2015-09-19 DIAGNOSIS — I4891 Unspecified atrial fibrillation: Secondary | ICD-10-CM

## 2015-09-19 DIAGNOSIS — Z5181 Encounter for therapeutic drug level monitoring: Secondary | ICD-10-CM

## 2015-09-19 DIAGNOSIS — I495 Sick sinus syndrome: Secondary | ICD-10-CM | POA: Diagnosis not present

## 2015-09-19 DIAGNOSIS — I482 Chronic atrial fibrillation, unspecified: Secondary | ICD-10-CM

## 2015-09-19 DIAGNOSIS — Z7901 Long term (current) use of anticoagulants: Secondary | ICD-10-CM

## 2015-09-19 LAB — CUP PACEART INCLINIC DEVICE CHECK
Battery Remaining Longevity: 102 mo
Brady Statistic RA Percent Paced: 5 %
Date Time Interrogation Session: 20170223095854
Implantable Lead Implant Date: 20050517
Implantable Lead Location: 753860
Implantable Lead Model: 4086
Implantable Lead Serial Number: 218470
Lead Channel Pacing Threshold Amplitude: 0.7 V
Lead Channel Pacing Threshold Pulse Width: 0.4 ms
Lead Channel Setting Pacing Amplitude: 2.4 V
Lead Channel Setting Pacing Pulse Width: 0.4 ms
Lead Channel Setting Sensing Sensitivity: 2.5 mV
MDC IDC LEAD IMPLANT DT: 20050517
MDC IDC LEAD LOCATION: 753859
MDC IDC LEAD MODEL: 4087
MDC IDC LEAD SERIAL: 237198
MDC IDC MSMT LEADCHNL RA IMPEDANCE VALUE: 505 Ohm
MDC IDC MSMT LEADCHNL RA SENSING INTR AMPL: 1.1 mV
MDC IDC MSMT LEADCHNL RV IMPEDANCE VALUE: 830 Ohm
MDC IDC MSMT LEADCHNL RV SENSING INTR AMPL: 9.9 mV
MDC IDC PG SERIAL: 389910
MDC IDC SET LEADCHNL RV PACING AMPLITUDE: 2.4 V
MDC IDC STAT BRADY RV PERCENT PACED: 6 %

## 2015-09-19 LAB — POCT INR: INR: 3.1

## 2015-09-19 MED ORDER — VERAPAMIL HCL ER 180 MG PO TBCR: 180.0000 mg | EXTENDED_RELEASE_TABLET | Freq: Every day | ORAL | Status: DC

## 2015-09-19 NOTE — Patient Instructions (Addendum)
Medication Instructions:  INCREASE Verapamil to 180 mg daily   Labwork: None Ordered   Testing/Procedures: None Ordered   Follow-Up: Your physician wants you to follow-up in: 6 months with [facility] and 12 months with Dr. Lovena Le.  You will receive a reminder letter in the mail two months in advance. If you don't receive a letter, please call our office to schedule the follow-up appointment. .  If you need a refill on your cardiac medications before your next appointment, please call your pharmacy.

## 2015-09-19 NOTE — Progress Notes (Signed)
HPI [name] returns today for followup. He is a very pleasant 70 year old man with a history of paroxysmal, now chronic atrial fibrillation, symptomatic tachybrady syndrome, status post permanent pacemaker insertion. He had been nicely controlled in sinus rhythm on amiodarone but developed recurrent persistent atrial fib for which he is minimally symptomatic.  He has no palpitations. He denies chest pain and minimal shortness of breath. No peripheral edema. No cough or hemoptysis. No nausea or vomiting. His ventricular rates have not been well controlled. When I saw him last, I asked him to start taking verapamil 120 mg daily and he has tolerated this nicely. He notes that his appetite has been down and maybe a little constipation. No Known Allergies   Current Outpatient Prescriptions  Medication Sig Dispense Refill  . atorvastatin (LIPITOR) 80 MG tablet Take 80 mg by mouth daily.    . Calcium Carbonate-Vitamin D (CALCIUM 500/D) 500-125 MG-UNIT TABS Take 1 tablet by mouth 2 (two) times daily.     Marland Kitchen DIGITEK 125 MCG tablet take 1 tablet by mouth once daily 90 tablet 3  . hydrochlorothiazide 25 MG tablet Take 25 mg by mouth daily.      . sertraline (ZOLOFT) 100 MG tablet Take 100 mg by mouth daily.      Marland Kitchen thiamine 100 MG tablet Take 100 mg by mouth daily.    . traZODone (DESYREL) 100 MG tablet Take 50-100 mg by mouth at bedtime.     . verapamil (CALAN-SR) 120 MG CR tablet Take 1 tablet (120 mg total) by mouth at bedtime. 90 tablet 3  . VIAGRA 25 MG tablet As directed    . vitamin B-12 (CYANOCOBALAMIN) 500 MCG tablet Take 500 mcg by mouth 2 (two) times daily.    Marland Kitchen warfarin (COUMADIN) 2 MG tablet take as directed BY COUMADIN CLINIC 50 tablet 3   No current facility-administered medications for this visit.     Past Medical History  Diagnosis Date  . Hypertension   . Hyperlipidemia   . Anxiety   . Coronary artery disease   . Atrial fibrillation ([place])   . Atrial flutter ([place])   . Sinus  bradycardia     ROS:   All systems reviewed and negative except as noted in the HPI.   Past Surgical History  Procedure Laterality Date  . Hernia repair    . Cataract extraction    . Replacement total knee      right knee  . Thumb surgery      left and right  . Permanent pacemaker insertion  2005; 08/07/2013    BSX dual chamber pacemaker implanted 2005 for symptomatic bradycardia; gen change 2015 by Dr Lovena Le  . Permanent pacemaker generator change N/A 08/07/2013    Procedure: PERMANENT PACEMAKER GENERATOR CHANGE;  Surgeon: Evans Lance, MD;  Location: [facility] CATH LAB;  Service: Cardiovascular;  Laterality: N/A;     Family History  Problem Relation Age of Onset  . Cancer Father     lung cancer     Social History   Social History  . Marital Status: Single    Spouse Name: N/A  . Number of Children: N/A  . Years of Education: N/A   Occupational History  . Not on file.   Social History Main Topics  . Smoking status: Never Smoker   . Smokeless tobacco: Not on file  . Alcohol Use: Yes     Comment: rarely  . Drug Use: Yes     Comment: rare marijuana   .  Sexual Activity: Not on file   Other Topics Concern  . Not on file   Social History Narrative     BP 138/82 mmHg  Pulse 112  Ht 5\' 4"  (1.626 m)  Wt 153 lb (69.4 kg)  BMI 26.25 kg/m2  Physical Exam:  stable appearing middle-aged man, NAD HEENT: Unremarkable Neck:  6 cm JVD, no thyromegally Lungs:  Clear with no wheezes, rales, or rhonchi. HEART:  IRegular rate rhythm, no murmurs, no rubs, no clicks Abd:  soft, positive bowel sounds, no organomegally, no rebound, no guarding Ext:  2 plus pulses, no edema, no cyanosis, no clubbing Skin:  No rashes no nodules Neuro:  CN II through XII intact, motor grossly intact  DEVICE  Normal device function.  See PaceArt for details.  ECG - atrial fibrillation with a RVR   Assess/Plan:  1. Atrial fib with a RVR - his ventricular rate is still not well controlled.  We will ask him to increase his verapamil to 180 mg daily. 2. HTN - his blood pressure is slightly increased. Hopefully uptitration of his verapamil will improve his blood pressure control. 3. Symptomatic bradycardia - resolved now that he is in atrial fib 4. PPM - his [employer] sci PPM is working normally. Will recheck in several months.  Mikle Bosworth.D.

## 2015-10-04 ENCOUNTER — Ambulatory Visit (INDEPENDENT_AMBULATORY_CARE_PROVIDER_SITE_OTHER): Payer: 59 | Admitting: *Deleted

## 2015-10-04 DIAGNOSIS — I4891 Unspecified atrial fibrillation: Secondary | ICD-10-CM

## 2015-10-04 DIAGNOSIS — Z5181 Encounter for therapeutic drug level monitoring: Secondary | ICD-10-CM

## 2015-10-04 DIAGNOSIS — I482 Chronic atrial fibrillation, unspecified: Secondary | ICD-10-CM

## 2015-10-04 DIAGNOSIS — Z7901 Long term (current) use of anticoagulants: Secondary | ICD-10-CM | POA: Diagnosis not present

## 2015-10-04 LAB — POCT INR: INR: 2

## 2015-10-14 ENCOUNTER — Other Ambulatory Visit: Payer: Self-pay | Admitting: *Deleted

## 2015-10-14 MED ORDER — WARFARIN SODIUM 2 MG PO TABS: ORAL_TABLET | ORAL | Status: DC

## 2015-10-25 ENCOUNTER — Ambulatory Visit (INDEPENDENT_AMBULATORY_CARE_PROVIDER_SITE_OTHER): Payer: 59 | Admitting: Pharmacist

## 2015-10-25 DIAGNOSIS — Z7901 Long term (current) use of anticoagulants: Secondary | ICD-10-CM

## 2015-10-25 DIAGNOSIS — I482 Chronic atrial fibrillation, unspecified: Secondary | ICD-10-CM

## 2015-10-25 DIAGNOSIS — Z5181 Encounter for therapeutic drug level monitoring: Secondary | ICD-10-CM

## 2015-10-25 DIAGNOSIS — I4891 Unspecified atrial fibrillation: Secondary | ICD-10-CM

## 2015-10-25 LAB — POCT INR: INR: 2.7

## 2015-11-04 ENCOUNTER — Emergency Department (HOSPITAL_COMMUNITY): Payer: 59

## 2015-11-04 ENCOUNTER — Encounter (HOSPITAL_COMMUNITY): Payer: Self-pay | Admitting: Emergency Medicine

## 2015-11-04 ENCOUNTER — Emergency Department (HOSPITAL_COMMUNITY)
Admission: EM | Admit: 2015-11-04 | Discharge: 2015-11-04 | Disposition: A | Discharge status: Home / Self Care | Payer: 59 | Attending: Emergency Medicine | Admitting: Emergency Medicine

## 2015-11-04 DIAGNOSIS — Z79899 Other long term (current) drug therapy: Secondary | ICD-10-CM | POA: Diagnosis not present

## 2015-11-04 DIAGNOSIS — F419 Anxiety disorder, unspecified: Secondary | ICD-10-CM | POA: Insufficient documentation

## 2015-11-04 DIAGNOSIS — I10 Essential (primary) hypertension: Secondary | ICD-10-CM | POA: Insufficient documentation

## 2015-11-04 DIAGNOSIS — S300XXA Contusion of lower back and pelvis, initial encounter: Secondary | ICD-10-CM | POA: Insufficient documentation

## 2015-11-04 DIAGNOSIS — W11XXXA Fall on and from ladder, initial encounter: Secondary | ICD-10-CM | POA: Diagnosis not present

## 2015-11-04 DIAGNOSIS — S199XXA Unspecified injury of neck, initial encounter: Secondary | ICD-10-CM | POA: Diagnosis present

## 2015-11-04 DIAGNOSIS — Y998 Other external cause status: Secondary | ICD-10-CM | POA: Insufficient documentation

## 2015-11-04 DIAGNOSIS — E785 Hyperlipidemia, unspecified: Secondary | ICD-10-CM | POA: Diagnosis not present

## 2015-11-04 DIAGNOSIS — S39012A Strain of muscle, fascia and tendon of lower back, initial encounter: Secondary | ICD-10-CM | POA: Diagnosis not present

## 2015-11-04 DIAGNOSIS — Y9389 Activity, other specified: Secondary | ICD-10-CM | POA: Diagnosis not present

## 2015-11-04 DIAGNOSIS — I2510 Atherosclerotic heart disease of native coronary artery without angina pectoris: Secondary | ICD-10-CM | POA: Diagnosis not present

## 2015-11-04 DIAGNOSIS — Y9289 Other specified places as the place of occurrence of the external cause: Secondary | ICD-10-CM | POA: Insufficient documentation

## 2015-11-04 DIAGNOSIS — M542 Cervicalgia: Secondary | ICD-10-CM

## 2015-11-04 LAB — CBC WITH DIFFERENTIAL/PLATELET
Basophils Absolute: 0.1 10*3/uL (ref 0.0–0.1)
Basophils Relative: 1 %
Eosinophils Absolute: 0.1 10*3/uL (ref 0.0–0.7)
Eosinophils Relative: 2 %
HCT: 46.2 % (ref 39.0–52.0)
Hemoglobin: 15.7 g/dL (ref 13.0–17.0)
LYMPHS ABS: 1.3 10*3/uL (ref 0.7–4.0)
LYMPHS PCT: 14 %
MCH: 33.6 pg (ref 26.0–34.0)
MCHC: 34 g/dL (ref 30.0–36.0)
MCV: 98.9 fL (ref 78.0–100.0)
Monocytes Absolute: 0.9 10*3/uL (ref 0.1–1.0)
Monocytes Relative: 9 %
NEUTROS ABS: 7.2 10*3/uL (ref 1.7–7.7)
NEUTROS PCT: 74 %
Platelets: 179 10*3/uL (ref 150–400)
RBC: 4.67 MIL/uL (ref 4.22–5.81)
RDW: 14.2 % (ref 11.5–15.5)
WBC: 9.6 10*3/uL (ref 4.0–10.5)

## 2015-11-04 LAB — PROTIME-INR
INR: 2.07 — AB (ref 0.00–1.49)
PROTHROMBIN TIME: 23.2 s — AB (ref 11.6–15.2)

## 2015-11-04 LAB — BASIC METABOLIC PANEL
ANION GAP: 12 (ref 5–15)
BUN: 12 mg/dL (ref 6–20)
CHLORIDE: 98 mmol/L — AB (ref 101–111)
CO2: 27 mmol/L (ref 22–32)
Calcium: 9.9 mg/dL (ref 8.9–10.3)
Creatinine, Ser: 1.06 mg/dL (ref 0.61–1.24)
GFR calc Af Amer: >60 mL/min (ref >60)
GFR calc non Af Amer: >60 mL/min (ref >60)
GLUCOSE: 117 mg/dL — AB (ref 65–99)
POTASSIUM: 4.5 mmol/L (ref 3.5–5.1)
Sodium: 137 mmol/L (ref 135–145)

## 2015-11-04 MED ORDER — OXYCODONE-ACETAMINOPHEN 5-325 MG PO TABS
1.0000 | ORAL_TABLET | Freq: Once | ORAL | Status: AC
  Administered 2015-11-04: 1 via ORAL
  Filled 2015-11-04: qty 1

## 2015-11-04 MED ORDER — OXYCODONE-ACETAMINOPHEN 5-325 MG PO TABS
ORAL_TABLET | ORAL | Status: AC
  Filled 2015-11-04: qty 1

## 2015-11-04 MED ORDER — OXYCODONE-ACETAMINOPHEN 5-325 MG PO TABS
1.0000 | ORAL_TABLET | ORAL | Status: DC | PRN
  Administered 2015-11-04: 1 via ORAL

## 2015-11-04 MED ORDER — METHOCARBAMOL 500 MG PO TABS: 500.0000 mg | ORAL_TABLET | Freq: Three times a day (TID) | ORAL | Status: DC | PRN

## 2015-11-04 NOTE — ED Notes (Signed)
C-collar applied. And nurse first made aware of mechanism of injury.

## 2015-11-04 NOTE — ED Notes (Signed)
Pt reassessed and notified RN That he was on coumadin; additional orders placed for pt

## 2015-11-04 NOTE — ED Notes (Signed)
MD at bedside. 

## 2015-11-04 NOTE — ED Notes (Signed)
Pt states he was trimming a tree with a ladder and the ladder slipped off of the tree and pt feel approximately 10 ft per pt and landed on his left side. Pt c/o of bilaterally neck pain and lower back. Pt denies any numbness or tingling to his extremities. Pt denies hitting his head or any LOC. pts pain is 10/10.

## 2015-11-04 NOTE — ED Provider Notes (Signed)
CSN: [id number]     Arrival date & time 11/04/15  1629 History   First MD Initiated Contact with Patient 11/04/15 1920     Chief Complaint  Patient presents with  . Trauma     (Consider location/radiation/quality/duration/timing/severity/associated sxs/prior Treatment) Patient is a 70 y.o. male presenting with trauma. The history is provided by the patient.  Trauma Mechanism of injury: from a ladder while trimming branches and fall Injury location: landed on his left buttock and back whipping his neck back but not hitting his head.  Incident location: home Time since incident: 4 hours Arrived directly from scene: yes   Fall:      Fall occurred: from a ladder      Height of fall: 10 ft      Impact surface: dirt      Point of impact: back, buttocks and neck      Entrapped after fall: no  Protective equipment:       None      Suspicion of alcohol use: no      Suspicion of drug use: no  EMS/PTA data:      Bystander interventions: none      Ambulatory at scene: yes      Blood loss: none      Responsiveness: alert      Oriented to: person, place, situation and time      Loss of consciousness: no      Amnesic to event: no      Airway interventions: none      Breathing interventions: none      IV access: none      IO access: none      Fluids administered: none      Cardiac interventions: none      Medications administered: none      Immobilization: none      Airway condition since incident: stable      Breathing condition since incident: stable      Circulation condition since incident: stable      Mental status condition since incident: stable      Disability condition since incident: stable  Current symptoms:      Pain scale: moderate.      Pain quality: aching      Pain timing: constant      Associated symptoms:            Reports back pain and neck pain.            Denies abdominal pain, chest pain, difficulty breathing, headache, hearing loss, loss of  consciousness, nausea, seizures and vomiting.   Relevant PMH:      Medical risk factors:            CAD.            No CHF or diabetes.       Pharmacological risk factors:            Anticoagulation therapy.       Tetanus status: unknown   Past Medical History  Diagnosis Date  . Hypertension   . Hyperlipidemia   . Anxiety   . Coronary artery disease   . Atrial fibrillation ([place])   . Atrial flutter ([place])   . Sinus bradycardia    Past Surgical History  Procedure Laterality Date  . Hernia repair    . Cataract extraction    . Replacement total knee      right knee  . Thumb surgery  left and right  . Permanent pacemaker insertion  2005; 08/07/2013    BSX dual chamber pacemaker implanted 2005 for symptomatic bradycardia; gen change 2015 by Dr Lovena Le  . Permanent pacemaker generator change N/A 08/07/2013    Procedure: PERMANENT PACEMAKER GENERATOR CHANGE;  Surgeon: Evans Lance, MD;  Location: [facility] CATH LAB;  Service: Cardiovascular;  Laterality: N/A;   Family History  Problem Relation Age of Onset  . Cancer Father     lung cancer   Social History  Substance Use Topics  . Smoking status: Never Smoker   . Smokeless tobacco: None  . Alcohol Use: Yes     Comment: rarely    Review of Systems  Constitutional: Positive for activity change. Negative for fever and chills.  HENT: Negative for congestion and hearing loss.   Eyes: Negative for visual disturbance.  Cardiovascular: Negative for chest pain.  Gastrointestinal: Negative for nausea, vomiting and abdominal pain.  Genitourinary: Positive for flank pain. Negative for penile pain and testicular pain.  Musculoskeletal: Positive for myalgias (left buttock where he fell on it a bruised it.), back pain and neck pain.  Skin: Negative for rash and wound.  Neurological: Negative for dizziness, seizures, loss of consciousness, syncope, weakness, numbness and headaches.  All other systems reviewed and are  negative.     Allergies  Review of patient's allergies indicates no known allergies.  Home Medications   Prior to Admission medications   Medication Sig Start Date End Date Taking? Authorizing Provider  atorvastatin (LIPITOR) 80 MG tablet Take 40 mg by mouth at bedtime.    Yes Historical Provider, MD  Calcium Carbonate-Vitamin D (CALCIUM 500/D) 500-125 MG-UNIT TABS Take 1 tablet by mouth 2 (two) times daily.    Yes Historical Provider, MD  DIGITEK 125 MCG tablet take 1 tablet by mouth once daily 07/31/15  Yes Evans Lance, MD  hydrochlorothiazide 25 MG tablet Take 12.5 mg by mouth daily.    Yes Historical Provider, MD  sertraline (ZOLOFT) 100 MG tablet Take 100 mg by mouth daily.     Yes Historical Provider, MD  sildenafil (VIAGRA) 25 MG tablet Take 12.5 mg by mouth daily as needed for erectile dysfunction.   Yes Historical Provider, MD  thiamine 100 MG tablet Take 100 mg by mouth daily.   Yes Historical Provider, MD  traZODone (DESYREL) 100 MG tablet Take 50 mg by mouth at bedtime.    Yes Historical Provider, MD  verapamil (CALAN-SR) 180 MG CR tablet Take 1 tablet (180 mg total) by mouth at bedtime. 09/19/15  Yes Evans Lance, MD  vitamin B-12 (CYANOCOBALAMIN) 500 MCG tablet Take 500 mcg by mouth 2 (two) times daily.   Yes Historical Provider, MD  warfarin (COUMADIN) 2 MG tablet Take as directed BY COUMADIN CLINIC Patient taking differently: Take 2-4 mg by mouth daily. Take 1 tablet (2 mg) by mouth on Monday, Wednesday, Friday; take 2 tablets (4 mg) on Sunday, Tuesday, Thursday, Saturday or as directed BY COUMADIN CLINIC 10/14/15  Yes Evans Lance, MD  methocarbamol (ROBAXIN) 500 MG tablet Take 1 tablet (500 mg total) by mouth every 8 (eight) hours as needed for muscle spasms. 11/04/15   Zenovia Jarred, DO   BP 133/105 mmHg  Pulse 83  Temp(Src) 97.5 F (36.4 C) (Oral)  Resp 16  Ht 5\' 4"  (1.626 m)  Wt 74.39 kg  BMI 28.14 kg/m2  SpO2 98% Physical Exam  Constitutional: He is  oriented to person, place, and time. He appears  well-developed and well-nourished. No distress. Cervical collar in place.  HENT:  Head: Normocephalic and atraumatic.  Nose: Nose normal.  Mouth/Throat: Oropharynx is clear and moist.  Eyes: Conjunctivae and EOM are normal. Pupils are equal, round, and reactive to light.  Cardiovascular: Normal rate, regular rhythm, normal heart sounds and intact distal pulses.   Pulmonary/Chest: Effort normal and breath sounds normal.  Abdominal: Soft. He exhibits no distension. There is no tenderness.  Musculoskeletal: He exhibits edema (swelling and hematoma noted on left glute) and tenderness.       Cervical back: He exhibits tenderness and bony tenderness.       Lumbar back: He exhibits tenderness.       Back:  Neurological: He is alert and oriented to person, place, and time. He has normal strength. No cranial nerve deficit or sensory deficit. Coordination normal. GCS eye subscore is 4. GCS verbal subscore is 5. GCS motor subscore is 6.  Skin: Skin is warm and dry. He is not diaphoretic.     Nursing note and vitals reviewed.   ED Course  Procedures (including critical care time) Labs Review Labs Reviewed  BASIC METABOLIC PANEL - Abnormal; Notable for the following:    Chloride 98 (*)    Glucose, Bld 117 (*)    All other components within normal limits  PROTIME-INR - Abnormal; Notable for the following:    Prothrombin Time 23.2 (*)    INR 2.07 (*)    All other components within normal limits  CBC WITH DIFFERENTIAL/PLATELET    Imaging Review Dg Lumbar Spine Complete  11/04/2015  CLINICAL DATA:  Status post fall 10 feet from ladder onto ground, with lower back pain. Initial encounter. EXAM: LUMBAR SPINE - COMPLETE 4+ VIEW COMPARISON:  None. FINDINGS: There is no evidence of fracture or subluxation. Vertebral bodies demonstrate normal height and alignment. Disc space narrowing is noted at the lower lumbar spine, with underlying facet disease and  lateral osteophyte formation. The visualized bowel gas pattern is unremarkable in appearance; air and stool are noted within the colon. The sacroiliac joints are within normal limits. Scattered vascular calcifications are seen. IMPRESSION: 1. No evidence of fracture or subluxation along the lumbar spine. 2. Mild degenerative change along the lower lumbar spine. 3. Scattered vascular calcifications seen. Electronically Signed   By: Garald Balding M.D.   On: 11/04/2015 19:12   Ct Head Wo Contrast  11/04/2015  CLINICAL DATA:  [place] off ladder. EXAM: CT HEAD WITHOUT CONTRAST TECHNIQUE: Contiguous axial images were obtained from the base of the skull through the vertex without intravenous contrast. COMPARISON:  None. FINDINGS: There is no intracranial hemorrhage, mass or evidence of acute infarction. There is no extra-axial fluid collection. Gray matter and white matter appear normal. Cerebral volume is normal for age. Brainstem and posterior fossa are unremarkable. The CSF spaces appear normal. The bony structures are intact. The visible portions of the paranasal sinuses are clear. IMPRESSION: Normal brain Electronically Signed   By: Andreas Newport M.D.   On: 11/04/2015 21:17   Ct Cervical Spine Wo Contrast  11/04/2015  CLINICAL DATA:  Pain following fall from ladder EXAM: CT CERVICAL SPINE WITHOUT CONTRAST TECHNIQUE: Multidetector CT imaging of the cervical spine was performed without intravenous contrast. Multiplanar CT image reconstructions were also generated. COMPARISON:  None. FINDINGS: Patient has had previous fractures of the laminae on the right at C4, C5, and C6 with postoperative screw and plate fixation in these areas. There is evidence of prior fractures at the  junctions of the left C4 and C5 laminae on the left in the respective spinous processes. These areas do not appear acute. No acute fracture is apparent. There is slight retrolisthesis of C3 on C4, felt to be due to underlying spondylosis.  There is slight anterolisthesis of C6 on C7 and anterolisthesis of C7 on T1, felt to be due to underlying spondylosis. There is mild anterolisthesis of T2 on T3, also felt to be due to underlying spondylosis. Prevertebral soft tissues and predental space regions are normal. There is marked disc space narrowing at C3-4, C4-5, C5-6, C6-7, C7-T1, T1-2, and T2-3. There is facet hypertrophy with bony overgrowth at essentially all levels bilaterally. There is carotid artery calcification bilaterally. There is also calcification in the aortic arch region. IMPRESSION: Postoperative change involving the lamina regions of C4, C5, and C6 on the right with postoperative surgical fixation in these areas. 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 No acute appearing fracture evident. Extensive multilevel arthropathy. Mild spondylolisthesis at multiple levels is felt to be due to the underlying extensive spondylosis. Focal areas of carotid artery calcification noted bilaterally. Electronically Signed   By: Lowella Grip III M.D.   On: 11/04/2015 19:29   I have personally reviewed and evaluated these images and lab results as part of my medical decision-making.   EKG Interpretation None      MDM  70 y.o. L with a history of CAD, atrial fibrillation, status post pacemaker placement, on Coumadin to the emergency department after he fell approximately 10 feet from a ladder while trimming branches from a tree. He fell backwards landing on his left gluteal and lower back bracing himself from striking his head on the ground below. Negative LOC nor amnesia of the event. Physical exam as above, significant for both paraspinal and midline neck tenderness as well as paraspinal lumbar muscle tenderness, and a significant contusion noted to his left gluteal. He is status post neck surgery last year for repair of chronic degenerative bony  abnormalities. Labs drawn and showed no significant abnormality. INR of 2.07, therapeutic however dangerous in the setting of trauma. For this reason a CT head was performed which found no acute intracranial abnormality. CT C-spine showed extensive postoperative changes but no evidence of acute-appearing fracture. Lumbar spine x-rays were performed and showed no evidence of acute fracture or subluxation. His collar was cleared in the ED with no evidence of radiculopathy or neurologic deficit. He expressed feeling significantly better after symptomatic treatment with pain relief and the ED. Feel that luckily he has only sustained contusion and likely muscle strain in his cervical and lumbar paraspinal musculature. He was prescribed Robaxin for further symptomatic relief and was recommended to follow-up with his primary care physician in the next few days to monitor for improvement in his symptoms. This plan was discussed with the patient at the bedside and he stated both understanding and agreement.   Final diagnoses:  Fall from ladder, initial encounter  Neck pain  Strain of lumbar paraspinal muscle, initial encounter  Contusion of buttock, initial encounter       Zenovia Jarred, DO 11/05/15 [place], DO 11/05/15 1311

## 2015-11-22 ENCOUNTER — Ambulatory Visit (INDEPENDENT_AMBULATORY_CARE_PROVIDER_SITE_OTHER): Payer: 59 | Admitting: *Deleted

## 2015-11-22 DIAGNOSIS — I4891 Unspecified atrial fibrillation: Secondary | ICD-10-CM | POA: Diagnosis not present

## 2015-11-22 DIAGNOSIS — Z5181 Encounter for therapeutic drug level monitoring: Secondary | ICD-10-CM

## 2015-11-22 DIAGNOSIS — I482 Chronic atrial fibrillation, unspecified: Secondary | ICD-10-CM

## 2015-11-22 DIAGNOSIS — Z7901 Long term (current) use of anticoagulants: Secondary | ICD-10-CM | POA: Diagnosis not present

## 2015-11-22 LAB — POCT INR: INR: 2.9

## 2015-12-26 ENCOUNTER — Ambulatory Visit (INDEPENDENT_AMBULATORY_CARE_PROVIDER_SITE_OTHER): Payer: 59

## 2015-12-26 DIAGNOSIS — Z7901 Long term (current) use of anticoagulants: Secondary | ICD-10-CM | POA: Diagnosis not present

## 2015-12-26 DIAGNOSIS — I482 Chronic atrial fibrillation, unspecified: Secondary | ICD-10-CM

## 2015-12-26 DIAGNOSIS — I4891 Unspecified atrial fibrillation: Secondary | ICD-10-CM

## 2015-12-26 DIAGNOSIS — Z5181 Encounter for therapeutic drug level monitoring: Secondary | ICD-10-CM

## 2015-12-26 LAB — POCT INR: INR: 2.9

## 2016-01-14 ENCOUNTER — Emergency Department (HOSPITAL_COMMUNITY)
Admission: EM | Admit: 2016-01-14 | Discharge: 2016-01-14 | Disposition: A | Discharge status: Home / Self Care | Payer: 59 | Attending: Emergency Medicine | Admitting: Emergency Medicine

## 2016-01-14 ENCOUNTER — Encounter (HOSPITAL_COMMUNITY): Payer: Self-pay

## 2016-01-14 ENCOUNTER — Emergency Department (HOSPITAL_COMMUNITY): Payer: 59

## 2016-01-14 DIAGNOSIS — Z7901 Long term (current) use of anticoagulants: Secondary | ICD-10-CM | POA: Diagnosis not present

## 2016-01-14 DIAGNOSIS — I4891 Unspecified atrial fibrillation: Secondary | ICD-10-CM | POA: Diagnosis not present

## 2016-01-14 DIAGNOSIS — Z950 Presence of cardiac pacemaker: Secondary | ICD-10-CM | POA: Insufficient documentation

## 2016-01-14 DIAGNOSIS — Z96651 Presence of right artificial knee joint: Secondary | ICD-10-CM | POA: Diagnosis not present

## 2016-01-14 DIAGNOSIS — M542 Cervicalgia: Secondary | ICD-10-CM | POA: Diagnosis present

## 2016-01-14 DIAGNOSIS — Y92410 Unspecified street and highway as the place of occurrence of the external cause: Secondary | ICD-10-CM | POA: Diagnosis not present

## 2016-01-14 DIAGNOSIS — S50811A Abrasion of right forearm, initial encounter: Secondary | ICD-10-CM | POA: Insufficient documentation

## 2016-01-14 DIAGNOSIS — E785 Hyperlipidemia, unspecified: Secondary | ICD-10-CM | POA: Insufficient documentation

## 2016-01-14 DIAGNOSIS — Y939 Activity, unspecified: Secondary | ICD-10-CM | POA: Insufficient documentation

## 2016-01-14 DIAGNOSIS — Y999 Unspecified external cause status: Secondary | ICD-10-CM | POA: Insufficient documentation

## 2016-01-14 DIAGNOSIS — I2510 Atherosclerotic heart disease of native coronary artery without angina pectoris: Secondary | ICD-10-CM | POA: Diagnosis not present

## 2016-01-14 DIAGNOSIS — I10 Essential (primary) hypertension: Secondary | ICD-10-CM | POA: Diagnosis not present

## 2016-01-14 DIAGNOSIS — Z79899 Other long term (current) drug therapy: Secondary | ICD-10-CM | POA: Insufficient documentation

## 2016-01-14 MED ORDER — HYDROCODONE-ACETAMINOPHEN 5-325 MG PO TABS
1.0000 | ORAL_TABLET | Freq: Once | ORAL | Status: AC
  Administered 2016-01-14: 1 via ORAL
  Filled 2016-01-14: qty 1

## 2016-01-14 MED ORDER — HYDROCODONE-ACETAMINOPHEN 5-325 MG PO TABS: 1.0000 | ORAL_TABLET | Freq: Four times a day (QID) | ORAL | Status: DC | PRN

## 2016-01-14 MED ORDER — BACITRACIN ZINC 500 UNIT/GM EX OINT
TOPICAL_OINTMENT | Freq: Two times a day (BID) | CUTANEOUS | Status: DC
  Administered 2016-01-14: 1 via TOPICAL
  Filled 2016-01-14: qty 0.9
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
## 2016-01-14 NOTE — ED Notes (Signed)
MD at bedside. 

## 2016-01-30 ENCOUNTER — Encounter (INDEPENDENT_AMBULATORY_CARE_PROVIDER_SITE_OTHER): Payer: Self-pay

## 2016-01-30 ENCOUNTER — Ambulatory Visit (INDEPENDENT_AMBULATORY_CARE_PROVIDER_SITE_OTHER): Payer: 59 | Admitting: *Deleted

## 2016-01-30 DIAGNOSIS — Z7901 Long term (current) use of anticoagulants: Secondary | ICD-10-CM | POA: Diagnosis not present

## 2016-01-30 DIAGNOSIS — Z5181 Encounter for therapeutic drug level monitoring: Secondary | ICD-10-CM | POA: Diagnosis not present

## 2016-01-30 DIAGNOSIS — I482 Chronic atrial fibrillation, unspecified: Secondary | ICD-10-CM

## 2016-01-30 DIAGNOSIS — I4891 Unspecified atrial fibrillation: Secondary | ICD-10-CM

## 2016-01-30 LAB — POCT INR: INR: 1.7

## 2016-02-10 ENCOUNTER — Telehealth: Payer: Self-pay | Admitting: Internal Medicine

## 2016-02-10 NOTE — Telephone Encounter (Signed)
New message     The pt had a EKG done at the [place] and it was abnormal the MD over at the [place] told the pt is Ventricular rate and the pacemaker was not working, the pt is having surgery next mth and was instructed to call the clinic and notify the cardiologist to see what to do.

## 2016-02-11 NOTE — Telephone Encounter (Signed)
Spoke with Phillip Kline. He reports that the [place] yesterday did an EKG and resulted "abnormal" and they told him his PPM was not working. He says that his HR was 61bpm. His PPM is programmed DDIR with a lower rate of 60bpm due to permanent AFib. Scheduled for PPM check tomorrow 02/12/16. Patient is agreeable.

## 2016-02-12 ENCOUNTER — Ambulatory Visit (INDEPENDENT_AMBULATORY_CARE_PROVIDER_SITE_OTHER): Payer: 59 | Admitting: Pharmacist

## 2016-02-12 ENCOUNTER — Encounter: Payer: Self-pay | Admitting: Internal Medicine

## 2016-02-12 ENCOUNTER — Ambulatory Visit (INDEPENDENT_AMBULATORY_CARE_PROVIDER_SITE_OTHER): Payer: 59 | Admitting: *Deleted

## 2016-02-12 DIAGNOSIS — I482 Chronic atrial fibrillation, unspecified: Secondary | ICD-10-CM

## 2016-02-12 DIAGNOSIS — I4891 Unspecified atrial fibrillation: Secondary | ICD-10-CM | POA: Diagnosis not present

## 2016-02-12 DIAGNOSIS — Z5181 Encounter for therapeutic drug level monitoring: Secondary | ICD-10-CM

## 2016-02-12 DIAGNOSIS — Z7901 Long term (current) use of anticoagulants: Secondary | ICD-10-CM

## 2016-02-12 LAB — POCT INR: INR: 2

## 2016-02-18 ENCOUNTER — Other Ambulatory Visit: Payer: Self-pay

## 2016-02-18 MED ORDER — WARFARIN SODIUM 2 MG PO TABS: ORAL_TABLET | ORAL | 3 refills | Status: DC

## 2016-02-21 NOTE — Progress Notes (Signed)
Pacemaker check in clinic. Normal device function. Threshold, sensing, impedances consistent with previous measurements. Device programmed to maximize longevity. 376 NSVT since Sep 19 2015. EGMs that are available indicate RVR.  Device programmed at appropriate safety margins. Histogram distribution appropriate for patient activity level. Device programmed to optimize intrinsic conduction. Estimated longevity 9 years. ROV with GT in 08/2016. Patient education completed.

## 2016-03-17 ENCOUNTER — Ambulatory Visit (INDEPENDENT_AMBULATORY_CARE_PROVIDER_SITE_OTHER): Payer: 59 | Admitting: Pharmacist

## 2016-03-17 ENCOUNTER — Encounter (INDEPENDENT_AMBULATORY_CARE_PROVIDER_SITE_OTHER): Payer: Self-pay

## 2016-03-17 DIAGNOSIS — I4891 Unspecified atrial fibrillation: Secondary | ICD-10-CM

## 2016-03-17 DIAGNOSIS — Z5181 Encounter for therapeutic drug level monitoring: Secondary | ICD-10-CM | POA: Diagnosis not present

## 2016-03-17 DIAGNOSIS — Z7901 Long term (current) use of anticoagulants: Secondary | ICD-10-CM | POA: Diagnosis not present

## 2016-03-17 LAB — POCT INR: INR: 2

## 2016-04-15 ENCOUNTER — Telehealth: Payer: Self-pay | Admitting: Internal Medicine

## 2016-04-15 ENCOUNTER — Ambulatory Visit (INDEPENDENT_AMBULATORY_CARE_PROVIDER_SITE_OTHER): Payer: 59 | Admitting: *Deleted

## 2016-04-15 DIAGNOSIS — I4891 Unspecified atrial fibrillation: Secondary | ICD-10-CM | POA: Diagnosis not present

## 2016-04-15 DIAGNOSIS — Z5181 Encounter for therapeutic drug level monitoring: Secondary | ICD-10-CM | POA: Diagnosis not present

## 2016-04-15 DIAGNOSIS — Z7901 Long term (current) use of anticoagulants: Secondary | ICD-10-CM | POA: Diagnosis not present

## 2016-04-15 LAB — POCT INR: INR: 2.4

## 2016-04-15 NOTE — Telephone Encounter (Signed)
F/u Message ° °Pt call stating he was returning RN call .please call back to discuss  °

## 2016-04-15 NOTE — Telephone Encounter (Signed)
Telephoned pt back and updated his medication list. Lisinopril and Quetiapine added.

## 2016-05-20 ENCOUNTER — Ambulatory Visit (INDEPENDENT_AMBULATORY_CARE_PROVIDER_SITE_OTHER): Payer: 59 | Admitting: *Deleted

## 2016-05-20 DIAGNOSIS — Z5181 Encounter for therapeutic drug level monitoring: Secondary | ICD-10-CM | POA: Diagnosis not present

## 2016-05-20 DIAGNOSIS — Z7901 Long term (current) use of anticoagulants: Secondary | ICD-10-CM

## 2016-05-20 DIAGNOSIS — I4891 Unspecified atrial fibrillation: Secondary | ICD-10-CM

## 2016-05-20 LAB — POCT INR: INR: 3.3

## 2016-06-10 ENCOUNTER — Encounter: Payer: Self-pay | Admitting: Gastroenterology

## 2016-06-16 ENCOUNTER — Other Ambulatory Visit: Payer: Self-pay | Admitting: Cardiology

## 2016-06-17 ENCOUNTER — Ambulatory Visit (INDEPENDENT_AMBULATORY_CARE_PROVIDER_SITE_OTHER): Payer: 59 | Admitting: *Deleted

## 2016-06-17 DIAGNOSIS — Z5181 Encounter for therapeutic drug level monitoring: Secondary | ICD-10-CM | POA: Diagnosis not present

## 2016-06-17 DIAGNOSIS — I4891 Unspecified atrial fibrillation: Secondary | ICD-10-CM | POA: Diagnosis not present

## 2016-06-17 DIAGNOSIS — Z7901 Long term (current) use of anticoagulants: Secondary | ICD-10-CM | POA: Diagnosis not present

## 2016-06-17 LAB — POCT INR: INR: 2.7

## 2016-07-02 ENCOUNTER — Encounter: Payer: Self-pay | Admitting: Gastroenterology

## 2016-07-15 ENCOUNTER — Ambulatory Visit (INDEPENDENT_AMBULATORY_CARE_PROVIDER_SITE_OTHER): Payer: 59 | Admitting: *Deleted

## 2016-07-15 DIAGNOSIS — I4891 Unspecified atrial fibrillation: Secondary | ICD-10-CM | POA: Diagnosis not present

## 2016-07-15 DIAGNOSIS — Z7901 Long term (current) use of anticoagulants: Secondary | ICD-10-CM | POA: Diagnosis not present

## 2016-07-15 DIAGNOSIS — Z5181 Encounter for therapeutic drug level monitoring: Secondary | ICD-10-CM

## 2016-07-15 LAB — POCT INR: INR: 3.2

## 2016-07-17 ENCOUNTER — Telehealth: Payer: Self-pay | Admitting: *Deleted

## 2016-07-17 NOTE — Telephone Encounter (Signed)
Spoke with patient about Latitude NXT home monitor.  He states he turned this back in years ago because he couldn't get it to transmit.  Patient is agreeable to [place] him with setting up a new home monitor and cell adapter.  Patient is aware that I will give his number to [place] rep, to set up an appointment for the monitor to be setup.  Patient is also agreeable to setting up appointment (1 year recall) to see Dr. Lovena Le on 09/25/16 at 8:30am.  He verbalizes confirmation of appointment date, time, and location.  Patient is appreciative of call and denies additional questions or concerns at this time.

## 2016-07-30 ENCOUNTER — Ambulatory Visit (INDEPENDENT_AMBULATORY_CARE_PROVIDER_SITE_OTHER): Payer: 59 | Admitting: Pharmacist

## 2016-07-30 DIAGNOSIS — Z5181 Encounter for therapeutic drug level monitoring: Secondary | ICD-10-CM

## 2016-07-30 DIAGNOSIS — I4891 Unspecified atrial fibrillation: Secondary | ICD-10-CM | POA: Diagnosis not present

## 2016-07-30 DIAGNOSIS — Z7901 Long term (current) use of anticoagulants: Secondary | ICD-10-CM | POA: Diagnosis not present

## 2016-07-30 LAB — POCT INR: INR: 2.5

## 2016-08-18 ENCOUNTER — Telehealth: Payer: Self-pay

## 2016-08-18 ENCOUNTER — Encounter (INDEPENDENT_AMBULATORY_CARE_PROVIDER_SITE_OTHER): Payer: Self-pay

## 2016-08-18 ENCOUNTER — Encounter: Payer: Self-pay | Admitting: Gastroenterology

## 2016-08-18 ENCOUNTER — Ambulatory Visit (INDEPENDENT_AMBULATORY_CARE_PROVIDER_SITE_OTHER): Payer: 59 | Admitting: Gastroenterology

## 2016-08-18 VITALS — BP 98/62 | Ht 64.0 in | Wt 167.0 lb

## 2016-08-18 DIAGNOSIS — Z7901 Long term (current) use of anticoagulants: Secondary | ICD-10-CM

## 2016-08-18 DIAGNOSIS — Z86010 Personal history of colonic polyps: Secondary | ICD-10-CM | POA: Diagnosis not present

## 2016-08-18 MED ORDER — NA SULFATE-K SULFATE-MG SULF 17.5-3.13-1.6 GM/177ML PO SOLN: 1.0000 | Freq: Once | ORAL | 0 refills | Status: AC

## 2016-08-18 NOTE — Telephone Encounter (Signed)
   ARNES GRANUM [birth year] [id number]  Dear Dr. Lovena Le:  We have scheduled the above named patient for a(n) Colonoscopy procedure. Our records show that (s)he is on anticoagulation therapy.  Please advise as to whether the patient may come of their therapy of coumadin 5 days prior to their procedure which is scheduled for 09/29/16.  Please route your response to Marlon Pel, CMA or fax response to [phone].  Sincerely,    [facility]

## 2016-08-18 NOTE — Progress Notes (Signed)
History of Present Illness: This is a 71 year old male self referred for the evaluation of a history of colon polyps who is maintained on anticoagulation. Reviewing his chart he underwent colonoscopy by Dr. Lyla Son in 07/2006. Procedure indication lists history of adenomatous colon polyps however I cannot find a prior colonoscopy or pathology record in Epic. Colonoscopy in 07/2006 showed one hyperplastic polyp and mild left-sided diverticulosis. He has no ongoing gastrointestinal complaints. He is maintained on warfarin for atrial fibrillation. He is status post pacemaker placement. Denies weight loss, abdominal pain, constipation, diarrhea, change in stool caliber, melena, hematochezia, nausea, vomiting, dysphagia, reflux symptoms, chest pain.   No Known Allergies Outpatient Medications Prior to Visit  Medication Sig Dispense Refill  . atorvastatin (LIPITOR) 80 MG tablet Take 40 mg by mouth at bedtime.     . Calcium Carbonate-Vitamin D (CALCIUM 500/D) 500-125 MG-UNIT TABS Take 1 tablet by mouth 2 (two) times daily.     Marland Kitchen DIGITEK 125 MCG tablet take 1 tablet by mouth once daily 90 tablet 3  . hydrochlorothiazide 25 MG tablet Take 12.5 mg by mouth daily.     Marland Kitchen lisinopril (PRINIVIL,ZESTRIL) 2.5 MG tablet Take 2.5 mg by mouth daily.    . QUEtiapine (SEROQUEL) 25 MG tablet Take 25 mg by mouth at bedtime. Takes 1/2 tablet QHS    . sertraline (ZOLOFT) 100 MG tablet Take 100 mg by mouth daily. Taking 1.5 tablets everyday    . sildenafil (VIAGRA) 25 MG tablet Take 12.5 mg by mouth daily as needed for erectile dysfunction.    . thiamine 100 MG tablet Take 100 mg by mouth daily.    . verapamil (CALAN-SR) 180 MG CR tablet Take 1 tablet (180 mg total) by mouth at bedtime. 90 tablet 3  . vitamin B-12 (CYANOCOBALAMIN) 500 MCG tablet Take 500 mcg by mouth 2 (two) times daily.    Marland Kitchen warfarin (COUMADIN) 2 MG tablet take as directed BY COUMADIN CLINIC 50 tablet 3  . HYDROcodone-acetaminophen (NORCO) 5-325 MG  tablet Take 1 tablet by mouth every 6 (six) hours as needed for severe pain. 10 tablet 0  . methocarbamol (ROBAXIN) 500 MG tablet Take 1 tablet (500 mg total) by mouth every 8 (eight) hours as needed for muscle spasms. 20 tablet 0  . traZODone (DESYREL) 100 MG tablet Take 50 mg by mouth at bedtime.      No facility-administered medications prior to visit.    Past Medical History:  Diagnosis Date  . Abdominal hernia   . Anxiety   . Atrial fibrillation ([place])   . Atrial flutter ([place])   . Coronary artery disease   . Hyperlipidemia   . Hypertension   . Sinus bradycardia    Past Surgical History:  Procedure Laterality Date  . CATARACT EXTRACTION    . hernia repair    . PERMANENT PACEMAKER GENERATOR CHANGE N/A 08/07/2013   Procedure: PERMANENT PACEMAKER GENERATOR CHANGE;  Surgeon: Evans Lance, MD;  Location: [facility] CATH LAB;  Service: Cardiovascular;  Laterality: N/A;  . PERMANENT PACEMAKER INSERTION  2005; 08/07/2013   BSX dual chamber pacemaker implanted 2005 for symptomatic bradycardia; gen change 2015 by Dr Lovena Le  . REPLACEMENT TOTAL KNEE     right knee  . thumb surgery     left and right   Social History   Social History  . Marital status: Single    Spouse name: N/A  . Number of children: N/A  . Years of education: N/A  Social History Main Topics  . Smoking status: Current Some Day Smoker  . Smokeless tobacco: Never Used     Comment: Marijuana  . Alcohol use Yes     Comment: 4-5 beers weekly  . Drug use: Yes     Comment: rare marijuana   . Sexual activity: Not Asked   Other Topics Concern  . None   Social History Narrative  . None   Family History  Problem Relation Age of Onset  . Cancer Father     lung cancer  . Stomach cancer Mother   . Stomach cancer Sister       Review of Systems: Pertinent positive and negative review of systems were noted in the above HPI section. All other review of systems were otherwise negative.   Physical Exam: General: Well  developed, well nourished, no acute distress Head: Normocephalic and atraumatic Eyes:  sclerae anicteric, EOMI Ears: Normal auditory acuity Mouth: No deformity or lesions Neck: Supple, no masses or thyromegaly Lungs: Clear throughout to auscultation Heart: Irregular rate and rhythm; no murmurs, rubs or bruits Abdomen: Soft, non tender and non distended. No masses, hepatosplenomegaly or hernias noted. Normal Bowel sounds Rectal: deferred to colonoscopy Musculoskeletal: Symmetrical with no gross deformities  Skin: No lesions on visible extremities Pulses:  Normal pulses noted Extremities: No clubbing, cyanosis, edema or deformities noted Neurological: Alert oriented x 4, grossly nonfocal Cervical Nodes:  No significant cervical adenopathy Inguinal Nodes: No significant inguinal adenopathy Psychological:  Alert and cooperative. Normal mood and affect  Assessment and Recommendations:  1. Personal history of colon polyps-type unknown. He is 10 years out from his last colonoscopy so he is due for routine screening anyway. Schedule colonoscopy. The risks (including bleeding, perforation, infection, missed lesions, medication reactions and possible hospitalization or surgery if complications occur), benefits, and alternatives to colonoscopy with possible biopsy and possible polypectomy were discussed with the patient and they consent to proceed.   2. History of atrial fibrillation on chronic warfarin. Status post pacemaker placement. Last office visit with Dr. Lovena Le was in February 2017 and patient was advised to contact Dr. Tanna Furry office regarding appropriate follow-up. Hold warfarin 5 days before procedure - will instruct when and how to resume after procedure. Low but real risk of cardiovascular event such as heart attack, stroke, embolism, thrombosis or ischemia/infarct of other organs off warfarin explained and need to seek urgent help if this occurs. The patient consents to proceed. Will  communicate by phone or EMR with patient's prescribing provider to confirm that holding warfarin is reasonable in this case.

## 2016-08-18 NOTE — Patient Instructions (Signed)
You have been scheduled for a colonoscopy. Please follow written instructions given to you at your visit today.  Please pick up your prep supplies at the pharmacy within the next 1-3 days. If you use inhalers (even only as needed), please bring them with you on the day of your procedure. Your physician has requested that you go to www.startemmi.com and enter the access code given to you at your visit today. This web site gives a general overview about your procedure. However, you should still follow specific instructions given to you by our office regarding your preparation for the procedure.  Thank you for choosing me and [facility].  Malcolm T. [facility], Jr., MD., FACG  

## 2016-08-19 NOTE — Telephone Encounter (Signed)
You may hold his coumadin for up to 5 days before procedure and restart when bleeding risk is acceptable. GT

## 2016-08-20 ENCOUNTER — Other Ambulatory Visit: Payer: Self-pay | Admitting: Internal Medicine

## 2016-08-20 NOTE — Telephone Encounter (Signed)
Notified patient per Dr. Lovena Le to hold coumadin 5 days prior to his procedure. Patient verbalized understanding.

## 2016-08-20 NOTE — Telephone Encounter (Signed)
Patient returned my call and left me a message to call him back.   Left a message for patient to return my call.

## 2016-08-20 NOTE — Telephone Encounter (Signed)
Left a message for patient to return my call. 

## 2016-08-27 ENCOUNTER — Ambulatory Visit (INDEPENDENT_AMBULATORY_CARE_PROVIDER_SITE_OTHER): Payer: 59 | Admitting: *Deleted

## 2016-08-27 ENCOUNTER — Other Ambulatory Visit: Payer: Self-pay | Admitting: Internal Medicine

## 2016-08-27 DIAGNOSIS — I4891 Unspecified atrial fibrillation: Secondary | ICD-10-CM

## 2016-08-27 DIAGNOSIS — Z7901 Long term (current) use of anticoagulants: Secondary | ICD-10-CM | POA: Diagnosis not present

## 2016-08-27 DIAGNOSIS — Z5181 Encounter for therapeutic drug level monitoring: Secondary | ICD-10-CM

## 2016-08-27 DIAGNOSIS — I482 Chronic atrial fibrillation, unspecified: Secondary | ICD-10-CM

## 2016-08-27 LAB — POCT INR: INR: 2

## 2016-09-08 ENCOUNTER — Telehealth: Payer: Self-pay | Admitting: *Deleted

## 2016-09-08 NOTE — Telephone Encounter (Signed)
-----   Message from Evans Lance, MD sent at 09/08/2016 10:10 AM EST ----- Ok with me. GT ----- Message ----- From: Margretta Sidle, RN Sent: 08/27/2016  11:31 AM To: Evans Lance, MD  Dr Annitta Needs Mr Biderman in the coumadin clinic today and he states he wants to take the Polar Plunge for [employer] on Feb 28th and he wants to know if this will be ok for him to do  Please Advise Thank You Elbert Ewings RN

## 2016-09-08 NOTE — Telephone Encounter (Signed)
Thanks Dr. Taylor

## 2016-09-11 ENCOUNTER — Telehealth: Payer: Self-pay | Admitting: Internal Medicine

## 2016-09-11 NOTE — Telephone Encounter (Signed)
Follow up   Pt verbalized that he asked about the polar plunge challenge   and that he has not heard anything back from rn please call pt

## 2016-09-14 NOTE — Telephone Encounter (Signed)
Called, pt unavailable. Left voice message to call back.  

## 2016-09-17 ENCOUNTER — Encounter: Payer: Self-pay | Admitting: Gastroenterology

## 2016-09-21 ENCOUNTER — Ambulatory Visit (INDEPENDENT_AMBULATORY_CARE_PROVIDER_SITE_OTHER): Payer: 59 | Admitting: *Deleted

## 2016-09-21 DIAGNOSIS — Z5181 Encounter for therapeutic drug level monitoring: Secondary | ICD-10-CM

## 2016-09-21 DIAGNOSIS — Z7901 Long term (current) use of anticoagulants: Secondary | ICD-10-CM

## 2016-09-21 DIAGNOSIS — I4891 Unspecified atrial fibrillation: Secondary | ICD-10-CM | POA: Diagnosis not present

## 2016-09-21 LAB — POCT INR: INR: 2.6

## 2016-09-25 ENCOUNTER — Encounter: Payer: 59 | Admitting: Internal Medicine

## 2016-09-26 ENCOUNTER — Other Ambulatory Visit: Payer: Self-pay | Admitting: Cardiology

## 2016-09-29 ENCOUNTER — Encounter: Payer: Self-pay | Admitting: Gastroenterology

## 2016-09-29 ENCOUNTER — Ambulatory Visit (AMBULATORY_SURGERY_CENTER): Payer: 59 | Admitting: Gastroenterology

## 2016-09-29 VITALS — BP 118/80 | HR 77 | Temp 97.5°F | Resp 12 | Ht 64.0 in | Wt 167.0 lb

## 2016-09-29 DIAGNOSIS — Z86010 Personal history of colonic polyps: Secondary | ICD-10-CM

## 2016-09-29 DIAGNOSIS — K621 Rectal polyp: Secondary | ICD-10-CM

## 2016-09-29 DIAGNOSIS — D128 Benign neoplasm of rectum: Secondary | ICD-10-CM

## 2016-09-29 MED ORDER — SODIUM CHLORIDE 0.9 % IV SOLN: 500.0000 mL | INTRAVENOUS | Status: DC

## 2016-09-29 NOTE — Op Note (Signed)
[name] Procedure Date: 09/29/2016 2:25 PM MRN: [id number] Endoscopist: Ladene Artist , MD Age: 71 Referring MD:  Date of Birth: 1946/04/05 Gender: Male Account #: [id number] Procedure:                Colonoscopy Indications:              High risk colon cancer surveillance: Personal                            history of colonic polyps Medicines:                Monitored Anesthesia Care Procedure:                Pre-Anesthesia Assessment:                           - Prior to the procedure, a History and Physical                            was performed, and patient medications and                            allergies were reviewed. The patient's tolerance of                            previous anesthesia was also reviewed. The risks                            and benefits of the procedure and the sedation                            options and risks were discussed with the patient.                            All questions were answered, and informed consent                            was obtained. Prior Anticoagulants: The patient has                            taken Coumadin (warfarin), last dose was 5 days                            prior to procedure. ASA Grade Assessment: III - A                            patient with severe systemic disease. After                            reviewing the risks and benefits, the patient was                            deemed in satisfactory condition to undergo the  procedure.                           After obtaining informed consent, the colonoscope                            was passed under direct vision. Throughout the                            procedure, the patient's blood pressure, pulse, and                            oxygen saturations were monitored continuously. The                            Model PCF-[id number]) scope was introduced       through the anus and advanced to the the cecum,                            identified by appendiceal orifice and ileocecal                            valve. The ileocecal valve, appendiceal orifice,                            and rectum were photographed. The quality of the                            bowel preparation was good. The colonoscopy was                            performed without difficulty. The patient tolerated                            the procedure well. Scope In: 2:34:41 PM Scope Out: 2:47:16 PM Scope Withdrawal Time: 0 hours 10 minutes 11 seconds  Total Procedure Duration: 0 hours 12 minutes 35 seconds  Findings:                 The perianal and digital rectal examinations were                            normal.                           Two sessile polyps were found in the rectum. The                            polyps were 3 to 5 mm in size. These polyps were                            removed with a cold biopsy forceps. Resection and                            retrieval were complete.  Internal hemorrhoids were found during                            retroflexion. The hemorrhoids were small and Grade                            I (internal hemorrhoids that do not prolapse).                           Multiple medium-mouthed diverticula were found in                            the left colon. There was narrowing of the colon in                            association with the diverticular opening. There                            was evidence of diverticular spasm. There was no                            evidence of diverticular bleeding.                           The exam was otherwise without abnormality on                            direct and retroflexion views. Complications:            No immediate complications. Estimated blood loss:                            None. Estimated Blood Loss:     Estimated blood loss: none. Impression:                - Two 3 to 5 mm polyps in the rectum, removed with                            a cold biopsy forceps. Resected and retrieved.                           - Internal hemorrhoids.                           - Moderate diverticulosis in the left colon.                           - The examination was otherwise normal on direct                            and retroflexion views. Recommendation:           - Repeat colonoscopy in 5 years for surveillance if                            polyp(s) are precancerous,  otherwise no plans for                            future surveillance colonoscopies based on age.                           - Resume Coumadin (warfarin) in 2 days at prior                            dose. Refer to managing physician for further                            adjustment of therapy.                           - Patient has a contact number available for                            emergencies. The signs and symptoms of potential                            delayed complications were discussed with the                            patient. Return to normal activities tomorrow.                            Written discharge instructions were provided to the                            patient.                           - High fiber diet.                           - Continue present medications.                           - Await pathology results.                           - No aspirin, ibuprofen, naproxen, or other                            non-steroidal anti-inflammatory drugs for 2 weeks                            after polyp removal. Ladene Artist, MD 09/29/2016 2:52:30 PM This report has been signed electronically.

## 2016-09-29 NOTE — Progress Notes (Signed)
Vitals taken on monitor in admitting.  122/70 b/p, 120 hr, 97.5 temp, 96% sat.  Apical pulse taken 74 irregular. Pt has a Chemical engineer - pacemaker.  Pt reported he had a hx of a fib.  He takes coumadin and the last dose taken was 09-22-16. Reported this to Cira Servant, CRNA. maw

## 2016-09-29 NOTE — Progress Notes (Signed)
Spontaneous respirations throughout. VSS. Resting comfortably. To PACU on room air. Report to  Sara RN. 

## 2016-09-29 NOTE — Patient Instructions (Signed)
YOU HAD AN ENDOSCOPIC PROCEDURE TODAY AT [facility]:   Refer to the procedure report that was given to you for any specific questions about what was found during the examination.  If the procedure report does not answer your questions, please call your gastroenterologist to clarify.  If you requested that your care partner not be given the details of your procedure findings, then the procedure report has been included in a sealed envelope for you to review at your convenience later.  YOU SHOULD EXPECT: Some feelings of bloating in the abdomen. Passage of more gas than usual.  Walking can help get rid of the air that was put into your GI tract during the procedure and reduce the bloating. If you had a lower endoscopy (such as a colonoscopy or flexible sigmoidoscopy) you may notice spotting of blood in your stool or on the toilet paper. If you underwent a bowel prep for your procedure, you may not have a normal bowel movement for a few days.  Please Note:  You might notice some irritation and congestion in your nose or some drainage.  This is from the oxygen used during your procedure.  There is no need for concern and it should clear up in a day or so.  SYMPTOMS TO REPORT IMMEDIATELY:   Following lower endoscopy (colonoscopy or flexible sigmoidoscopy):  Excessive amounts of blood in the stool  Significant tenderness or worsening of abdominal pains  Swelling of the abdomen that is new, acute  Fever of 100F or higher  For urgent or emergent issues, a gastroenterologist can be reached at any hour by calling [phone].   DIET:  We do recommend a small meal at first, but then you may proceed to your regular diet.  Drink plenty of fluids but you should avoid alcoholic beverages for 24 hours. We recommend following a High-Fiber diet.  MEDICATIONS:  Resume Coumadin (warfarin) in 2 days at prior dose. Refer to managing physician for further adjustment of therapy. NO Aspirin,  Ibuprofen, Naproxen, or other non-steroidal anti-inflammatory drugs for 2 weeks after polyp removal. Otherwise, continue present medications.  ACTIVITY:  You should plan to take it easy for the rest of today and you should NOT DRIVE or use heavy machinery until tomorrow (because of the sedation medicines used during the test).    FOLLOW UP: Our staff will call the number listed on your records the next business day following your procedure to check on you and address any questions or concerns that you may have regarding the information given to you following your procedure. If we do not reach you, we will leave a message.  However, if you are feeling well and you are not experiencing any problems, there is no need to return our call.  We will assume that you have returned to your regular daily activities without incident.  If any biopsies were taken you will be contacted by phone or by letter within the next 1-3 weeks.  Please call us at [phone] if you have not heard about the biopsies in 3 weeks.   Thank you for allowing [place] to provide for your healthcare needs today.   SIGNATURES/CONFIDENTIALITY: You and/or your care partner have signed paperwork which will be entered into your electronic medical record.  These signatures attest to the fact that that the information above on your After Visit Summary has been reviewed and is understood.  Full responsibility of the confidentiality of this discharge information lies with you and/or  your care-partner. 

## 2016-09-29 NOTE — Progress Notes (Signed)
Called to room to assist during endoscopic procedure.  Patient ID and intended procedure confirmed with present staff. Received instructions for my participation in the procedure from the performing physician.  

## 2016-09-30 ENCOUNTER — Telehealth: Payer: Self-pay

## 2016-09-30 NOTE — Telephone Encounter (Signed)
Left message on answering machine. 

## 2016-10-01 ENCOUNTER — Telehealth: Payer: Self-pay | Admitting: *Deleted

## 2016-10-01 NOTE — Telephone Encounter (Signed)
  Follow up Call-  Call back number 09/29/2016  Post procedure Call Back phone  # [phone] cell  Permission to leave phone message Yes  Some recent data might be hidden     Patient questions:  Do you have a fever, pain , or abdominal swelling? No. Pain Score  0 *  Have you tolerated food without any problems? Yes.    Have you been able to return to your normal activities? Yes.    Do you have any questions about your discharge instructions: Diet   No. Medications  No. Follow up visit  No.  Do you have questions or concerns about your Care? No.  Actions: * If pain score is 4 or above: No action needed, pain <4.

## 2016-10-06 ENCOUNTER — Ambulatory Visit (INDEPENDENT_AMBULATORY_CARE_PROVIDER_SITE_OTHER): Payer: 59 | Admitting: Pharmacist

## 2016-10-06 ENCOUNTER — Telehealth: Payer: Self-pay | Admitting: Internal Medicine

## 2016-10-06 DIAGNOSIS — Z5181 Encounter for therapeutic drug level monitoring: Secondary | ICD-10-CM

## 2016-10-06 DIAGNOSIS — I4891 Unspecified atrial fibrillation: Secondary | ICD-10-CM | POA: Diagnosis not present

## 2016-10-06 DIAGNOSIS — Z7901 Long term (current) use of anticoagulants: Secondary | ICD-10-CM

## 2016-10-06 LAB — POCT INR: INR: 1.3

## 2016-10-06 NOTE — Telephone Encounter (Signed)
Walk in pt form-patient dropped off copy of CT-placed in [place].

## 2016-10-09 ENCOUNTER — Encounter: Payer: Self-pay | Admitting: Gastroenterology

## 2016-10-15 ENCOUNTER — Ambulatory Visit (INDEPENDENT_AMBULATORY_CARE_PROVIDER_SITE_OTHER): Payer: 59 | Admitting: *Deleted

## 2016-10-15 DIAGNOSIS — I4891 Unspecified atrial fibrillation: Secondary | ICD-10-CM

## 2016-10-15 DIAGNOSIS — Z7901 Long term (current) use of anticoagulants: Secondary | ICD-10-CM | POA: Diagnosis not present

## 2016-10-15 DIAGNOSIS — Z5181 Encounter for therapeutic drug level monitoring: Secondary | ICD-10-CM

## 2016-10-15 LAB — POCT INR: INR: 2.3

## 2016-10-29 ENCOUNTER — Other Ambulatory Visit: Payer: Self-pay | Admitting: Internal Medicine

## 2016-10-29 ENCOUNTER — Ambulatory Visit (INDEPENDENT_AMBULATORY_CARE_PROVIDER_SITE_OTHER): Payer: 59 | Admitting: *Deleted

## 2016-10-29 DIAGNOSIS — Z7901 Long term (current) use of anticoagulants: Secondary | ICD-10-CM | POA: Diagnosis not present

## 2016-10-29 DIAGNOSIS — I4891 Unspecified atrial fibrillation: Secondary | ICD-10-CM

## 2016-10-29 DIAGNOSIS — Z5181 Encounter for therapeutic drug level monitoring: Secondary | ICD-10-CM | POA: Diagnosis not present

## 2016-10-29 LAB — POCT INR: INR: 1.7

## 2016-11-17 ENCOUNTER — Encounter: Payer: Self-pay | Admitting: Internal Medicine

## 2016-11-17 ENCOUNTER — Ambulatory Visit (INDEPENDENT_AMBULATORY_CARE_PROVIDER_SITE_OTHER): Payer: 59 | Admitting: *Deleted

## 2016-11-17 ENCOUNTER — Ambulatory Visit (INDEPENDENT_AMBULATORY_CARE_PROVIDER_SITE_OTHER): Payer: 59 | Admitting: Internal Medicine

## 2016-11-17 VITALS — BP 112/78 | HR 67 | Ht 64.0 in | Wt 160.8 lb

## 2016-11-17 DIAGNOSIS — I482 Chronic atrial fibrillation, unspecified: Secondary | ICD-10-CM

## 2016-11-17 DIAGNOSIS — Z7901 Long term (current) use of anticoagulants: Secondary | ICD-10-CM

## 2016-11-17 DIAGNOSIS — Z5181 Encounter for therapeutic drug level monitoring: Secondary | ICD-10-CM | POA: Diagnosis not present

## 2016-11-17 DIAGNOSIS — Z79899 Other long term (current) drug therapy: Secondary | ICD-10-CM | POA: Diagnosis not present

## 2016-11-17 DIAGNOSIS — I495 Sick sinus syndrome: Secondary | ICD-10-CM | POA: Diagnosis not present

## 2016-11-17 DIAGNOSIS — Z950 Presence of cardiac pacemaker: Secondary | ICD-10-CM | POA: Diagnosis not present

## 2016-11-17 DIAGNOSIS — I4891 Unspecified atrial fibrillation: Secondary | ICD-10-CM | POA: Diagnosis not present

## 2016-11-17 LAB — POCT INR: INR: 2.3

## 2016-11-17 MED ORDER — VERAPAMIL HCL ER 240 MG PO TBCR: 240.0000 mg | EXTENDED_RELEASE_TABLET | Freq: Every day | ORAL | 3 refills | Status: DC

## 2016-11-17 NOTE — Patient Instructions (Addendum)
Medication Instructions:   Your physician has recommended you make the following change in your medication:  1) INCREASE Verapamil to 240 mg once daily  (start taking this medication before having the stress test)  --- If you need a refill on your cardiac medications before your next appointment, please call your pharmacy. ---  Labwork:  None ordered  Testing/Procedures: Your physician has requested that you have an exercise tolerance test (on a day Dr. Lovena Le is in the office). For further information please visit HugeFiesta.[email or web address]. Please also follow instruction sheet, as given.  Follow-Up: Remote monitoring is used to monitor your Pacemaker of ICD from home. This monitoring reduces the number of office visits required to check your device to one time per year. It allows [place] to keep an eye on the functioning of your device to ensure it is working properly. You are scheduled for a device check from home on 02/16/2017. You may send your transmission at any time that day. If you have a wireless device, the transmission will be sent automatically. After your physician reviews your transmission, you will receive a postcard with your next transmission date.   Your physician wants you to follow-up in: 1 year with Dr. Lovena Le.  You will receive a reminder letter in the mail two months in advance. If you don't receive a letter, please call our office to schedule the follow-up appointment.  Thank you for choosing [facility]!!

## 2016-11-17 NOTE — Progress Notes (Signed)
HPI [name] returns today for followup. He is a very pleasant 71 year old man with a history of now chronic atrial fibrillation, symptomatic tachybrady syndrome, status post permanent pacemaker insertion. He had been nicely controlled in sinus rhythm on amiodarone but developed recurrent persistent atrial fib for which he is minimally symptomatic.  He has no palpitations. He denies chest pain and minimal shortness of breath. No peripheral edema. No cough or hemoptysis. No nausea or vomiting. His ventricular rates have not been well controlled. When I saw him last, I asked him to start taking verapamil 120 mg daily andthis has been increased and digoxin started as well. His VR's are still not well controlled. He is above 100/min about 35-40% of the time. No Known Allergies   Current Outpatient Prescriptions  Medication Sig Dispense Refill  . atorvastatin (LIPITOR) 80 MG tablet Take 40 mg by mouth at bedtime.     . Calcium Carbonate-Vitamin D (CALCIUM 500/D) 500-125 MG-UNIT TABS Take 1 tablet by mouth 2 (two) times daily.     Marland Kitchen DIGITEK 125 MCG tablet take 1 tablet by mouth once daily 90 tablet 0  . hydrochlorothiazide 25 MG tablet Take 12.5 mg by mouth daily.     Marland Kitchen lisinopril (PRINIVIL,ZESTRIL) 2.5 MG tablet Take 2.5 mg by mouth daily.    . QUEtiapine (SEROQUEL) 25 MG tablet Take 25 mg by mouth at bedtime. Takes 1/2 tablet QHS    . sertraline (ZOLOFT) 100 MG tablet Take 100 mg by mouth daily. Taking 1.5 tablets everyday    . sildenafil (VIAGRA) 25 MG tablet Take 12.5 mg by mouth daily as needed for erectile dysfunction.    . thiamine 100 MG tablet Take 100 mg by mouth daily.    . vitamin B-12 (CYANOCOBALAMIN) 500 MCG tablet Take 500 mcg by mouth 2 (two) times daily.    Marland Kitchen warfarin (COUMADIN) 2 MG tablet take as directed BY COUMADIN CLINIC 50 tablet 2  . verapamil (CALAN-SR) 240 MG CR tablet Take 1 tablet (240 mg total) by mouth at bedtime. 90 tablet 3   Current Facility-Administered Medications   Medication Dose Route Frequency Provider Last Rate Last Dose  . 0.9 %  sodium chloride infusion  500 mL Intravenous Continuous Ladene Artist, MD      . 0.9 %  sodium chloride infusion  500 mL Intravenous Continuous Ladene Artist, MD         Past Medical History:  Diagnosis Date  . Abdominal hernia   . Anxiety   . Arthritis   . Atrial fibrillation ([place])   . Atrial flutter ([place])   . Blood transfusion without reported diagnosis   . Cataract    bilateral cataracts removed  . Clotting disorder (H[facility])    coumadin - a fib  . Coronary artery disease   . Depression   . Hyperlipidemia   . Hypertension   . Pacemaker    [employer]  . Sinus bradycardia   . Sleep apnea    wears a c-pap    ROS:   All systems reviewed and negative except as noted in the HPI.   Past Surgical History:  Procedure Laterality Date  . CATARACT EXTRACTION    . COLONOSCOPY    . hernia repair    . PERMANENT PACEMAKER GENERATOR CHANGE N/A 08/07/2013   Procedure: PERMANENT PACEMAKER GENERATOR CHANGE;  Surgeon: Evans Lance, MD;  Location: [facility] CATH LAB;  Service: Cardiovascular;  Laterality: N/A;  . PERMANENT PACEMAKER INSERTION  2005; 08/07/2013  BSX dual chamber pacemaker implanted 2005 for symptomatic bradycardia; gen change 2015 by Dr Lovena Le  . REPLACEMENT TOTAL KNEE     right knee  . thumb surgery     left and right  . TONSILLECTOMY       Family History  Problem Relation Age of Onset  . Cancer Father     lung cancer  . Stomach cancer Mother   . Stomach cancer Sister   . Colon cancer Neg Hx   . Esophageal cancer Neg Hx   . Pancreatic cancer Neg Hx   . Prostate cancer Neg Hx   . Rectal cancer Neg Hx      Social History   Social History  . Marital status: Single    Spouse name: N/A  . Number of children: N/A  . Years of education: N/A   Occupational History  . Not on file.   Social History Main Topics  . Smoking status: Current Some Day Smoker    Types: E-cigarettes  .  Smokeless tobacco: Never Used     Comment: Marijuana  . Alcohol use Yes     Comment: 4-5 beers weekly  . Drug use: Yes    Types: Marijuana     Comment: smoked marijuana 09-26-16  . Sexual activity: Not on file   Other Topics Concern  . Not on file   Social History Narrative  . No narrative on file     BP 112/78   Pulse 67   Ht 5\' 4"  (1.626 m)   Wt 160 lb 12.8 oz (72.9 kg)   BMI 27.60 kg/m   Physical Exam:  stable appearing middle-aged man, NAD HEENT: Unremarkable Neck:  6 cm JVD, no thyromegally Lungs:  Clear with no wheezes, rales, or rhonchi. HEART:  IRegular rate rhythm, no murmurs, no rubs, no clicks Abd:  soft, positive bowel sounds, no organomegally, no rebound, no guarding Ext:  2 plus pulses, no edema, no cyanosis, no clubbing Skin:  No rashes no nodules Neuro:  CN II through XII intact, motor grossly intact  DEVICE  Normal device function.  See PaceArt for details.  ECG - atrial fibrillation with a RVR   Assess/Plan:  1. Atrial fib with a RVR - his ventricular rate is still not well controlled. We will ask him to increase his verapamil to 180 mg daily. 2. HTN - his blood pressure is slightly increased. Hopefully uptitration of his verapamil will improve his blood pressure control. 3. Symptomatic bradycardia - resolved now that he is in atrial fib 4. PPM - his [facility] PPM is working normally. Will recheck in several months.  Mikle Bosworth.D.

## 2016-11-19 LAB — CUP PACEART INCLINIC DEVICE CHECK
Battery Remaining Longevity: 96 mo
Date Time Interrogation Session: 20180424040000
Implantable Lead Implant Date: 20050517
Implantable Lead Location: 753859
Implantable Lead Model: 4087
Implantable Pulse Generator Implant Date: 20150112
Lead Channel Impedance Value: 459 Ohm
Lead Channel Impedance Value: 758 Ohm
Lead Channel Pacing Threshold Amplitude: 0.8 V
Lead Channel Pacing Threshold Pulse Width: 0.4 ms
Lead Channel Setting Pacing Amplitude: 2.4 V
Lead Channel Setting Pacing Amplitude: 2.4 V
Lead Channel Setting Pacing Pulse Width: 0.4 ms
MDC IDC LEAD IMPLANT DT: 20050517
MDC IDC LEAD LOCATION: 753860
MDC IDC LEAD SERIAL: 218470
MDC IDC LEAD SERIAL: 237198
MDC IDC MSMT LEADCHNL RA PACING THRESHOLD AMPLITUDE: 1.2 V
MDC IDC MSMT LEADCHNL RA PACING THRESHOLD PULSEWIDTH: 0.4 ms
MDC IDC MSMT LEADCHNL RA SENSING INTR AMPL: 1.4 mV
MDC IDC MSMT LEADCHNL RV SENSING INTR AMPL: 7.7 mV
MDC IDC SET LEADCHNL RV SENSING SENSITIVITY: 2.5 mV
Pulse Gen Serial Number: 389910

## 2016-12-01 ENCOUNTER — Ambulatory Visit (INDEPENDENT_AMBULATORY_CARE_PROVIDER_SITE_OTHER): Payer: 59

## 2016-12-01 DIAGNOSIS — I482 Chronic atrial fibrillation, unspecified: Secondary | ICD-10-CM

## 2016-12-01 DIAGNOSIS — Z79899 Other long term (current) drug therapy: Secondary | ICD-10-CM | POA: Diagnosis not present

## 2016-12-01 DIAGNOSIS — I495 Sick sinus syndrome: Secondary | ICD-10-CM | POA: Diagnosis not present

## 2016-12-01 LAB — EXERCISE TOLERANCE TEST
CSEPED: 3 min
CSEPEW: 4.9 METS
CSEPHR: 78 %
Exercise duration (sec): 29 s
MPHR: 149 {beats}/min
Peak HR: 117 {beats}/min
RPE: 15
Rest HR: 65 {beats}/min

## 2016-12-15 ENCOUNTER — Ambulatory Visit (INDEPENDENT_AMBULATORY_CARE_PROVIDER_SITE_OTHER): Payer: 59 | Admitting: *Deleted

## 2016-12-15 DIAGNOSIS — I4891 Unspecified atrial fibrillation: Secondary | ICD-10-CM

## 2016-12-15 DIAGNOSIS — Z5181 Encounter for therapeutic drug level monitoring: Secondary | ICD-10-CM

## 2016-12-15 DIAGNOSIS — Z7901 Long term (current) use of anticoagulants: Secondary | ICD-10-CM

## 2016-12-15 LAB — POCT INR: INR: 2.2

## 2016-12-25 ENCOUNTER — Other Ambulatory Visit: Payer: Self-pay | Admitting: Internal Medicine

## 2017-01-12 ENCOUNTER — Ambulatory Visit (INDEPENDENT_AMBULATORY_CARE_PROVIDER_SITE_OTHER): Payer: 59 | Admitting: *Deleted

## 2017-01-12 DIAGNOSIS — I4891 Unspecified atrial fibrillation: Secondary | ICD-10-CM | POA: Diagnosis not present

## 2017-01-12 DIAGNOSIS — Z5181 Encounter for therapeutic drug level monitoring: Secondary | ICD-10-CM | POA: Diagnosis not present

## 2017-01-12 DIAGNOSIS — Z7901 Long term (current) use of anticoagulants: Secondary | ICD-10-CM

## 2017-01-12 LAB — POCT INR: INR: 2.4

## 2017-01-26 ENCOUNTER — Telehealth: Payer: Self-pay | Admitting: Internal Medicine

## 2017-01-26 ENCOUNTER — Other Ambulatory Visit: Payer: Self-pay | Admitting: Internal Medicine

## 2017-01-26 NOTE — Telephone Encounter (Signed)
New message       Pt is having 3 teeth pulled  thursday should he stop his coumadin tomorrow?  Please call

## 2017-01-26 NOTE — Telephone Encounter (Signed)
Returned call to pt and advised him to skip his Coumadin tomorrow - on Coumadin for afib, no hx of stroke or mechanical valve. He already took his dose today. Appt is scheduled for 10am on Thursday and pt states they will check his INR prior. Advised pt to eat extra greens today and tomorrow as well. He was instructed to resume his Coumadin as usual on Thursday after his procedure.

## 2017-01-28 ENCOUNTER — Telehealth: Payer: Self-pay | Admitting: *Deleted

## 2017-01-28 NOTE — Telephone Encounter (Signed)
Spoke with pt and gave instruction that there is no interaction between Coumadin and Hydrocodone Acetaminophen pt states understanding and that he has already restarted his coumadin

## 2017-02-16 ENCOUNTER — Ambulatory Visit (INDEPENDENT_AMBULATORY_CARE_PROVIDER_SITE_OTHER): Payer: 59 | Admitting: *Deleted

## 2017-02-16 DIAGNOSIS — I495 Sick sinus syndrome: Secondary | ICD-10-CM

## 2017-02-17 NOTE — Progress Notes (Signed)
Remote pacemaker transmission.   

## 2017-02-18 ENCOUNTER — Encounter: Payer: Self-pay | Admitting: Cardiology

## 2017-02-22 ENCOUNTER — Ambulatory Visit (INDEPENDENT_AMBULATORY_CARE_PROVIDER_SITE_OTHER): Payer: 59 | Admitting: *Deleted

## 2017-02-22 DIAGNOSIS — Z5181 Encounter for therapeutic drug level monitoring: Secondary | ICD-10-CM

## 2017-02-22 DIAGNOSIS — I4891 Unspecified atrial fibrillation: Secondary | ICD-10-CM | POA: Diagnosis not present

## 2017-02-22 DIAGNOSIS — Z7901 Long term (current) use of anticoagulants: Secondary | ICD-10-CM

## 2017-02-22 LAB — POCT INR: INR: 2.9

## 2017-03-25 LAB — CUP PACEART REMOTE DEVICE CHECK
Battery Remaining Percentage: 100 %
Brady Statistic RA Percent Paced: 25 %
Brady Statistic RV Percent Paced: 17 %
Implantable Lead Implant Date: 20050517
Implantable Lead Model: 4086
Implantable Lead Model: 4087
Implantable Pulse Generator Implant Date: 20150112
Lead Channel Impedance Value: 408 Ohm
Lead Channel Impedance Value: 673 Ohm
Lead Channel Setting Pacing Amplitude: 2.4 V
Lead Channel Setting Pacing Amplitude: 2.4 V
MDC IDC LEAD IMPLANT DT: 20050517
MDC IDC LEAD LOCATION: 753859
MDC IDC LEAD LOCATION: 753860
MDC IDC LEAD SERIAL: 218470
MDC IDC LEAD SERIAL: 237198
MDC IDC MSMT BATTERY REMAINING LONGEVITY: 108 mo
MDC IDC MSMT LEADCHNL RA PACING THRESHOLD AMPLITUDE: 1.2 V
MDC IDC MSMT LEADCHNL RA PACING THRESHOLD PULSEWIDTH: 0.4 ms
MDC IDC SESS DTM: 20180724043100
MDC IDC SET LEADCHNL RV PACING PULSEWIDTH: 0.4 ms
MDC IDC SET LEADCHNL RV SENSING SENSITIVITY: 2.5 mV
Pulse Gen Serial Number: 389910

## 2017-04-05 ENCOUNTER — Encounter (INDEPENDENT_AMBULATORY_CARE_PROVIDER_SITE_OTHER): Payer: Self-pay

## 2017-04-05 ENCOUNTER — Ambulatory Visit (INDEPENDENT_AMBULATORY_CARE_PROVIDER_SITE_OTHER): Payer: 59 | Admitting: *Deleted

## 2017-04-05 DIAGNOSIS — I4891 Unspecified atrial fibrillation: Secondary | ICD-10-CM

## 2017-04-05 DIAGNOSIS — Z7901 Long term (current) use of anticoagulants: Secondary | ICD-10-CM

## 2017-04-05 DIAGNOSIS — I482 Chronic atrial fibrillation, unspecified: Secondary | ICD-10-CM

## 2017-04-05 DIAGNOSIS — Z5181 Encounter for therapeutic drug level monitoring: Secondary | ICD-10-CM | POA: Diagnosis not present

## 2017-04-05 LAB — POCT INR: INR: 3

## 2017-05-18 ENCOUNTER — Ambulatory Visit (INDEPENDENT_AMBULATORY_CARE_PROVIDER_SITE_OTHER): Payer: 59 | Admitting: *Deleted

## 2017-05-18 ENCOUNTER — Encounter (INDEPENDENT_AMBULATORY_CARE_PROVIDER_SITE_OTHER): Payer: Self-pay

## 2017-05-18 DIAGNOSIS — I495 Sick sinus syndrome: Secondary | ICD-10-CM

## 2017-05-18 DIAGNOSIS — Z7901 Long term (current) use of anticoagulants: Secondary | ICD-10-CM

## 2017-05-18 DIAGNOSIS — I4891 Unspecified atrial fibrillation: Secondary | ICD-10-CM

## 2017-05-18 DIAGNOSIS — Z5181 Encounter for therapeutic drug level monitoring: Secondary | ICD-10-CM | POA: Diagnosis not present

## 2017-05-18 DIAGNOSIS — I482 Chronic atrial fibrillation, unspecified: Secondary | ICD-10-CM

## 2017-05-18 LAB — POCT INR: INR: 3.1

## 2017-05-19 NOTE — Progress Notes (Signed)
Remote pacemaker transmission.   

## 2017-05-20 LAB — CUP PACEART REMOTE DEVICE CHECK
Battery Remaining Longevity: 114 mo
Battery Remaining Percentage: 100 %
Brady Statistic RA Percent Paced: 28 %
Brady Statistic RV Percent Paced: 16 %
Date Time Interrogation Session: 20181023044500
Implantable Lead Implant Date: 20050517
Implantable Lead Implant Date: 20050517
Implantable Lead Location: 753859
Implantable Lead Model: 4087
Implantable Lead Serial Number: 218470
Implantable Pulse Generator Implant Date: 20150112
Lead Channel Impedance Value: 709 Ohm
Lead Channel Pacing Threshold Pulse Width: 0.4 ms
Lead Channel Setting Pacing Pulse Width: 0.4 ms
Lead Channel Setting Sensing Sensitivity: 2.5 mV
MDC IDC LEAD LOCATION: 753860
MDC IDC LEAD SERIAL: 237198
MDC IDC MSMT LEADCHNL RA IMPEDANCE VALUE: 417 Ohm
MDC IDC MSMT LEADCHNL RA PACING THRESHOLD AMPLITUDE: 1.2 V
MDC IDC SET LEADCHNL RA PACING AMPLITUDE: 2.4 V
MDC IDC SET LEADCHNL RV PACING AMPLITUDE: 2.4 V
Pulse Gen Serial Number: 389910

## 2017-05-21 ENCOUNTER — Encounter: Payer: Self-pay | Admitting: Cardiology

## 2017-05-24 ENCOUNTER — Other Ambulatory Visit: Payer: Self-pay | Admitting: *Deleted

## 2017-05-24 MED ORDER — WARFARIN SODIUM 2 MG PO TABS: ORAL_TABLET | ORAL | 3 refills | Status: DC

## 2017-06-15 ENCOUNTER — Ambulatory Visit (INDEPENDENT_AMBULATORY_CARE_PROVIDER_SITE_OTHER): Payer: 59 | Admitting: Pharmacist

## 2017-06-15 DIAGNOSIS — I4891 Unspecified atrial fibrillation: Secondary | ICD-10-CM | POA: Diagnosis not present

## 2017-06-15 DIAGNOSIS — I482 Chronic atrial fibrillation, unspecified: Secondary | ICD-10-CM

## 2017-06-15 DIAGNOSIS — Z7901 Long term (current) use of anticoagulants: Secondary | ICD-10-CM | POA: Diagnosis not present

## 2017-06-15 DIAGNOSIS — Z5181 Encounter for therapeutic drug level monitoring: Secondary | ICD-10-CM | POA: Diagnosis not present

## 2017-06-15 LAB — POCT INR: INR: 2.3

## 2017-06-15 NOTE — Patient Instructions (Signed)
Continue taking 2 tablets everyday except 1 tablet on Mondays, Wednesdays, and Fridays.  Recheck INR in 4 weeks.  Coumadin Clinic [phone]

## 2017-07-13 ENCOUNTER — Ambulatory Visit (INDEPENDENT_AMBULATORY_CARE_PROVIDER_SITE_OTHER): Payer: 59 | Admitting: Pharmacist

## 2017-07-13 DIAGNOSIS — I4891 Unspecified atrial fibrillation: Secondary | ICD-10-CM | POA: Diagnosis not present

## 2017-07-13 DIAGNOSIS — Z5181 Encounter for therapeutic drug level monitoring: Secondary | ICD-10-CM

## 2017-07-13 DIAGNOSIS — Z7901 Long term (current) use of anticoagulants: Secondary | ICD-10-CM | POA: Diagnosis not present

## 2017-07-13 DIAGNOSIS — I482 Chronic atrial fibrillation, unspecified: Secondary | ICD-10-CM

## 2017-07-13 LAB — POCT INR: INR: 2.2

## 2017-07-13 NOTE — Patient Instructions (Signed)
Description   Continue taking 2 tablets everyday except 1 tablet on Mondays, Wednesdays, and Fridays.  Recheck INR in 5 weeks.  Coumadin Clinic [phone] - call when procedure scheduled

## 2017-07-26 ENCOUNTER — Telehealth: Payer: Self-pay | Admitting: *Deleted

## 2017-07-26 NOTE — Telephone Encounter (Signed)
Reached out to pt re: cardiac clearance, and pt has been scheduled to see Donnajean Lopes, PA-C 07/29/17. Pt thanked me for the call.

## 2017-07-26 NOTE — Telephone Encounter (Signed)
   Primary Cardiologist:Gregg Lovena Le, MD  Chart reviewed as part of pre-operative protocol coverage. Because of JANELLE CULTON past medical history and time since last visit, he/she will require a follow-up visit in order to better assess preoperative cardiovascular risk. He was last seen 10/2016 by Dr. Lovena Le at which time note indicates his ventricular rates were not well controlled, above 100/min about 35-40% of the time. Verapamil was increased. Subsequent device interrogations state "NSVT episodes AT/AF w/ RVR + Coumadin" with no changes made by last check 05/23/17. Do not see prior h/o NSVT in notes. No prior echo on file for review. Based on above information would recommend office visit with EP team prior to clearing for appointment to make sure patient optimized. Anticoagulation can be addressed at that time as well.  Pre-op covering staff: - Please schedule appointment with EP APP and call patient to inform them. If no EP APP in next few weeks, general APP is OK. - Please contact requesting surgeon's office via preferred method (i.e, phone, fax) to inform them of need for appointment prior to surgery.  Charlie Pitter, PA-C  07/26/2017, 12:43 PM

## 2017-07-26 NOTE — Progress Notes (Addendum)
Cardiology Office Note Date:  07/29/2017  Patient ID:  Phillip, Kline 1946-06-18, MRN [id number] PCP:  Henreitta Cea, MD  Cardiologist:  Dr. Lovena Le   Chief Complaint: pre-op evaluation  History of Present Illness: Phillip Kline is a 71 y.o. male with history of tachy-brady syndrome w/PPM, Persistent AFib, HTN, HLD, OSA w/CPAP.   CAD is mentioned in his problem in his PMHx though I do not find clear evidence of this, the patient denies hx of MI, never has had cath or heart procedures of any kind other then his PPM.  He comes today to be seen for Dr. Lovena Le, last seen by him in April, at that time, V rate not well controled, his BP a bit elevated as well, his verapamil was increased.  He comes today for pre-operative evaluation, scheduled for lumbar back surgery 08/26/16 with general anesthesia.  He denies any kind of CP, palpitations or SOB, no DOE, or exertional intolerances.  He has been walking his dog an hour every day including small hills at a good pace without symptoms or difficulties.  He denies any dizziness, no near syncope or syncope. 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  No bleeding or signs of bleeding, he follows his warfarin with the coumadin clinic.  Meghan Supple, RPH has already addressed his warfarin, planned/cleared to hold for 5  Days pre-operatively.  He reports his PMD at [facility] does his cholesterol checks/management as well as routine/annual labs  RCRI score is 0.4 though unknown current Creat, even if >2, risk score is 0.9 (less then 1, low) DUKE activity: 7.25 Based on ACC/AHA guidelines, Phillip Kline would be at acceptable risk for the planned procedure without further cardiovascular testing.   Device information: BSCi dual chamber PPM, implanted, 2005, gen change 08/07/13  Past Medical History:  Diagnosis Date  . Abdominal hernia   . Anxiety   . Arthritis   . Atrial fibrillation ([place])   . Atrial flutter ([place])     . Blood transfusion without reported diagnosis   . Cataract    bilateral cataracts removed  . Clotting disorder (HCC)    coumadin - a fib  . Coronary artery disease   . Depression   . Hyperlipidemia   . Hypertension   . Pacemaker    [employer]  . Sinus bradycardia   . Sleep apnea    wears a c-pap    Past Surgical History:  Procedure Laterality Date  . CATARACT EXTRACTION    . COLONOSCOPY    . hernia repair    . PERMANENT PACEMAKER GENERATOR CHANGE N/A 08/07/2013   Procedure: PERMANENT PACEMAKER GENERATOR CHANGE;  Surgeon: Evans Lance, MD;  Location: [facility] CATH LAB;  Service: Cardiovascular;  Laterality: N/A;  . PERMANENT PACEMAKER INSERTION  2005; 08/07/2013   BSX dual chamber pacemaker implanted 2005 for symptomatic bradycardia; gen change 2015 by Dr Lovena Le  . REPLACEMENT TOTAL KNEE     right knee  . thumb surgery     left and right  . TONSILLECTOMY      Current Outpatient Medications  Medication Sig Dispense Refill  . atorvastatin (LIPITOR) 80 MG tablet Take 40 mg by mouth at bedtime.     . Calcium Carbonate-Vitamin D (CALCIUM 500/D) 500-125 MG-UNIT TABS Take 1 tablet by mouth 2 (two) times daily.     . digoxin (DIGITEK) 0.125 MG tablet Take 1 tablet (125 mcg total) by mouth daily. 90 tablet 2  .  hydrochlorothiazide 25 MG tablet Take 12.5 mg by mouth daily.     Phillip Kline Kitchen lisinopril (PRINIVIL,ZESTRIL) 2.5 MG tablet Take 2.5 mg by mouth daily.    . prednisoLONE acetate (PREDNISOLONE ACETATE P-F) 1 % ophthalmic suspension Place 1 drop into both eyes 4 (four) times daily.    . QUEtiapine (SEROQUEL) 25 MG tablet Take 25 mg by mouth at bedtime. Takes 1/2 tablet QHS    . sertraline (ZOLOFT) 100 MG tablet Take 100 mg by mouth daily. Taking 1.5 tablets everyday    . sildenafil (VIAGRA) 25 MG tablet Take 12.5 mg by mouth daily as needed for erectile dysfunction.    . thiamine 100 MG tablet Take 100 mg by mouth daily.    . verapamil (CALAN-SR) 240 MG CR tablet Take 1 tablet (240 mg  total) by mouth at bedtime. 90 tablet 3  . vitamin B-12 (CYANOCOBALAMIN) 500 MCG tablet Take 500 mcg by mouth 2 (two) times daily.    Phillip Kline Kitchen warfarin (COUMADIN) 2 MG tablet Take as directed by coumadin clinic 50 tablet 3   Current Facility-Administered Medications  Medication Dose Route Frequency Provider Last Rate Last Dose  . 0.9 %  sodium chloride infusion  500 mL Intravenous Continuous Lucio Edward T, MD      . 0.9 %  sodium chloride infusion  500 mL Intravenous Continuous Ladene Artist, MD        Allergies:   Patient has no known allergies.   Social History:  The patient  reports that he has been smoking e-cigarettes.  he has never used smokeless tobacco. He reports that he drinks alcohol. He reports that he uses drugs. Drug: Marijuana.   Family History:  The patient's family history includes Cancer in his father; Stomach cancer in his mother and sister.  ROS:  Please see the history of present illness.  All other systems are reviewed and otherwise negative.   PHYSICAL EXAM:  VS:  BP 136/84   Pulse (!) 112   Ht 5\' 4"  (1.626 m)   Wt 146 lb (66.2 kg)   SpO2 96%   BMI 25.06 kg/m  BMI: Body mass index is 25.06 kg/m. Well nourished, well developed, in no acute distress  HEENT: normocephalic, atraumatic  Neck: no JVD, carotid bruits or masses Cardiac:   RRR; no significant murmurs, no rubs, or gallops Lungs: CTA b/l, no wheezing, rhonchi or rales  Abd: soft, nontender MS: no deformity, age appropriate atrophy Ext:  no edema, chronic looking skin changes  Skin: warm and dry, no rash Neuro:  No gross deficits appreciated Psych: euthymic mood, full affect  PPM site is stable, no tethering or discomfort   EKG:  Done today shows  #1 is AFib 86bpm, no signioficant change from prior EKGs #2 is SR 60bpm, PR 294ms, QRS 76ms, QTc 475ms PPM Interrogation done today and reviewed by myself:  battery and lead measurements are OK, NSVT episodes in review with device rep are felt to be  AF w/RVR not true NSVT and are 2-3 beats in duration only, this does not appear to be a new finding for him in review of prior device checks.  Presenting rhythm is AF/VS 60's  12/01/16: Exercise stress test Study Highlights   The patient walked for 3:29 of a Bruce protocol GXT. Peak HR of 117 which is 78% predicted maximal HR.  The patient is in atrial fib and is V paced. ST segments were not evaluated.  There was a controlled / gradual increase in HR with  exercise    Stress Findings  Stress Findings The patient exercised following the Bruce protocol.  The patient reported no symptoms during the stress test. The patient experienced no angina during the stress test.   The patient requested the test to be stopped.   Blood pressure and heart rate demonstrated a normal response to exercise. Blood pressure demonstrated a normal response to exercise. Overall, the patient's exercise capacity was mildly impaired.   Recovery time: 5 minutes. The patient's response to exercise was adequate for diagnosis.     Recent Labs: No results found for requested labs within last 8760 hours.  No results found for requested labs within last 8760 hours.   CrCl cannot be calculated (Patient's most recent lab result is older than the maximum 21 days allowed.).   Wt Readings from Last 3 Encounters:  07/29/17 146 lb (66.2 kg)  11/17/16 160 lb 12.8 oz (72.9 kg)  09/29/16 167 lb (75.8 kg)     Other studies reviewed: Additional studies/records reviewed today include: summarized above  ASSESSMENT AND PLAN:  1. PPM     Intact function  2. Persistent Afib     asymptomatic     CHA2DS2Vasc is 2, on warfarin, monitored and managed with the coumadin clinic     Programmed DDIR, has 20% AP noted, likely 80% AFib     Will get a dig level today  3. HTN     No changes today  4. Pre-op evaluation     Exercise stress test was submaximal, the patient doesn't recall any specifics the day of his stress testing,  reviewed EKGs and case with DOD, Dr. Burt Knack, patient reports excellent exertional tolerance RCRI score is 0.4-0.9 DUKE activity: 7.25 Dr. Burt Knack agrees, based on ACC/AHA guidelines, he would be at acceptable risk for the planned procedure without further cardiovascular testing.  He does recommend getting an echo for baseline/AF management purposes though does not need to be done pre-operatively.  The patient will need routine per-operative management of his pacemaker OK to hold warfarin pre-operatively 5 days as requested to resume when cleared by surgeon post-op, our preferance of course for the shortest duration that is felt safe post-op.  The patient will contact his surgeon's office to review medicine list and instructions regarding medicines, and 5 days off warfarin.   Disposition: F/u with q52month remote pacer checks, 1 year for in-clinic EP visit, sooner if needed, will f/u on echo.  Current medicines are reviewed at length with the patient today.  The patient did not have any concerns regarding medicines.  Venetia Night, PA-C 07/29/2017 11:02 AM     [address] [place] [address] [phone] (office)  [phone] (fax)

## 2017-07-26 NOTE — Telephone Encounter (Signed)
   [facility] Pre-operative Risk Assessment    Request for surgical clearance:  1. What type of surgery is being performed? LUMBAR INTERBODY FUSION L3-4, L4-5   2. When is this surgery scheduled? 08/26/17   3. Are there any medications that need to be held prior to surgery and how long?COUMADIN; REQUESTING 5 DAYS, UNLESS MD RECOMMENDS OTHERWISE  4. Practice name and name of physician performing surgery? [facility];  [facility]   6. What is your office phone and fax number? PH# [phone], FAX# [phone]   7. Anesthesia type (None, local, MAC, general) ? GENERAL   Julaine Hua 07/26/2017, 8:58 AM  _________________________________________________________________   (provider comments below)

## 2017-07-28 NOTE — Telephone Encounter (Signed)
Pt takes Coumadin for afib with CHADS2VASc score of 3 (age, HTN, CAD). Ok to hold Coumadin for 5 days prior to procedure.

## 2017-07-29 ENCOUNTER — Encounter: Payer: Self-pay | Admitting: Physician Assistant

## 2017-07-29 ENCOUNTER — Ambulatory Visit: Payer: 59 | Admitting: Physician Assistant

## 2017-07-29 VITALS — BP 136/84 | HR 112 | Ht 64.0 in | Wt 146.0 lb

## 2017-07-29 DIAGNOSIS — I481 Persistent atrial fibrillation: Secondary | ICD-10-CM

## 2017-07-29 DIAGNOSIS — Z950 Presence of cardiac pacemaker: Secondary | ICD-10-CM | POA: Diagnosis not present

## 2017-07-29 DIAGNOSIS — Z01810 Encounter for preprocedural cardiovascular examination: Secondary | ICD-10-CM

## 2017-07-29 DIAGNOSIS — I10 Essential (primary) hypertension: Secondary | ICD-10-CM

## 2017-07-29 DIAGNOSIS — Z79899 Other long term (current) drug therapy: Secondary | ICD-10-CM | POA: Diagnosis not present

## 2017-07-29 DIAGNOSIS — I4819 Other persistent atrial fibrillation: Secondary | ICD-10-CM

## 2017-07-29 NOTE — Patient Instructions (Addendum)
Medication Instructions:   Your physician recommends that you continue on your current medications as directed. Please refer to the Current Medication list given to you today.   If you need a refill on your cardiac medications before your next appointment, please call your pharmacy.  Labwork:  DIG LEVEL TODAY     Testing/Procedures:  Your physician has requested that you have an echocardiogram. Echocardiography is a painless test that uses sound waves to create images of your heart. It provides your doctor with information about the size and shape of your heart and how well your heart's chambers and valves are working. This procedure takes approximately one hour. There are no restrictions for this procedure.   Follow-Up:   Remote monitoring is used to monitor your Pacemaker of ICD from home. This monitoring reduces the number of office visits required to check your device to one time per year. It allows [place] to keep an eye on the functioning of your device to ensure it is working properly. You are scheduled for a device check from home on.08-17-17 You may send your transmission at any time that day. If you have a wireless device, the transmission will be sent automatically. After your physician reviews your transmission, you will receive a postcard with your next transmission date.     Any Other Special Instructions Will Be Listed Below (If Applicable).

## 2017-07-30 LAB — DIGOXIN LEVEL: DIGOXIN, SERUM: 0.8 ng/mL (ref 0.5–0.9)

## 2017-08-05 ENCOUNTER — Other Ambulatory Visit (HOSPITAL_COMMUNITY): Payer: 59

## 2017-08-17 ENCOUNTER — Ambulatory Visit (INDEPENDENT_AMBULATORY_CARE_PROVIDER_SITE_OTHER): Payer: 59

## 2017-08-17 ENCOUNTER — Ambulatory Visit (HOSPITAL_COMMUNITY): Payer: 59 | Attending: Cardiology

## 2017-08-17 ENCOUNTER — Other Ambulatory Visit: Payer: Self-pay

## 2017-08-17 ENCOUNTER — Ambulatory Visit (INDEPENDENT_AMBULATORY_CARE_PROVIDER_SITE_OTHER): Payer: 59 | Admitting: *Deleted

## 2017-08-17 DIAGNOSIS — I495 Sick sinus syndrome: Secondary | ICD-10-CM

## 2017-08-17 DIAGNOSIS — Z7901 Long term (current) use of anticoagulants: Secondary | ICD-10-CM

## 2017-08-17 DIAGNOSIS — Z5181 Encounter for therapeutic drug level monitoring: Secondary | ICD-10-CM

## 2017-08-17 DIAGNOSIS — I481 Persistent atrial fibrillation: Secondary | ICD-10-CM | POA: Insufficient documentation

## 2017-08-17 DIAGNOSIS — I4819 Other persistent atrial fibrillation: Secondary | ICD-10-CM

## 2017-08-17 DIAGNOSIS — I081 Rheumatic disorders of both mitral and tricuspid valves: Secondary | ICD-10-CM | POA: Diagnosis not present

## 2017-08-17 DIAGNOSIS — I4891 Unspecified atrial fibrillation: Secondary | ICD-10-CM

## 2017-08-17 LAB — POCT INR: INR: 2

## 2017-08-17 NOTE — Progress Notes (Signed)
Remote pacemaker transmission.   

## 2017-08-17 NOTE — Patient Instructions (Signed)
Description   Take 3 tablets today, then resume same dosage 2 tablets everyday except 1 tablet on Mondays, Wednesdays, and Fridays.  Take your last dosage of Coumadin on 08/20/17 prior to surgery on 08/26/17.  Resume Coumadin post procedure as soon as MD states OK to do so, resume previous dosage regimen 2 tablets daily except 1 tablet on Mondays, Wednesdays, and Fridays.   Recheck in 1 week after procedure. Coumadin Clinic [phone] - call when procedure scheduled

## 2017-08-18 ENCOUNTER — Encounter: Payer: Self-pay | Admitting: Cardiology

## 2017-09-03 ENCOUNTER — Ambulatory Visit (INDEPENDENT_AMBULATORY_CARE_PROVIDER_SITE_OTHER): Payer: 59 | Admitting: *Deleted

## 2017-09-03 ENCOUNTER — Encounter (INDEPENDENT_AMBULATORY_CARE_PROVIDER_SITE_OTHER): Payer: Self-pay

## 2017-09-03 DIAGNOSIS — Z5181 Encounter for therapeutic drug level monitoring: Secondary | ICD-10-CM

## 2017-09-03 DIAGNOSIS — I4891 Unspecified atrial fibrillation: Secondary | ICD-10-CM

## 2017-09-03 DIAGNOSIS — Z7901 Long term (current) use of anticoagulants: Secondary | ICD-10-CM

## 2017-09-03 DIAGNOSIS — I4819 Other persistent atrial fibrillation: Secondary | ICD-10-CM

## 2017-09-03 DIAGNOSIS — I481 Persistent atrial fibrillation: Secondary | ICD-10-CM | POA: Diagnosis not present

## 2017-09-03 LAB — POCT INR: INR: 1.8

## 2017-09-03 NOTE — Patient Instructions (Signed)
Description   Take 2 tablets today, then resume same dosage 2 tablets everyday except 1 tablet on Mondays, Wednesdays, and Fridays.  Recheck in 2 weeks. Coumadin Clinic [phone] - call when procedure scheduled

## 2017-09-07 LAB — CUP PACEART REMOTE DEVICE CHECK
Battery Remaining Longevity: 72 mo
Date Time Interrogation Session: 20190122053100
Implantable Lead Implant Date: 20050517
Implantable Lead Location: 753860
Implantable Lead Serial Number: 218470
Implantable Lead Serial Number: 237198
Implantable Pulse Generator Implant Date: 20150112
Lead Channel Impedance Value: 494 Ohm
Lead Channel Impedance Value: 728 Ohm
Lead Channel Pacing Threshold Amplitude: 1.2 V
Lead Channel Pacing Threshold Pulse Width: 0.4 ms
Lead Channel Setting Pacing Amplitude: 2.4 V
Lead Channel Setting Sensing Sensitivity: 2.5 mV
MDC IDC LEAD IMPLANT DT: 20050517
MDC IDC LEAD LOCATION: 753859
MDC IDC MSMT BATTERY REMAINING PERCENTAGE: 78 %
MDC IDC SET LEADCHNL RA PACING AMPLITUDE: 2.4 V
MDC IDC SET LEADCHNL RV PACING PULSEWIDTH: 0.4 ms
MDC IDC STAT BRADY RA PERCENT PACED: 9 %
MDC IDC STAT BRADY RV PERCENT PACED: 6 %
Pulse Gen Serial Number: 389910

## 2017-09-15 ENCOUNTER — Ambulatory Visit: Payer: 59 | Admitting: *Deleted

## 2017-09-15 DIAGNOSIS — Z5181 Encounter for therapeutic drug level monitoring: Secondary | ICD-10-CM

## 2017-09-15 DIAGNOSIS — I4891 Unspecified atrial fibrillation: Secondary | ICD-10-CM | POA: Diagnosis not present

## 2017-09-15 DIAGNOSIS — I4819 Other persistent atrial fibrillation: Secondary | ICD-10-CM

## 2017-09-15 DIAGNOSIS — I481 Persistent atrial fibrillation: Secondary | ICD-10-CM

## 2017-09-15 DIAGNOSIS — Z7901 Long term (current) use of anticoagulants: Secondary | ICD-10-CM | POA: Diagnosis not present

## 2017-09-15 LAB — POCT INR: INR: 2.5

## 2017-09-15 NOTE — Patient Instructions (Signed)
Description   Continue taking 2 tablets every day except 1 tablet on Mondays, Wednesdays, and Fridays.  Recheck in 3 weeks. Coumadin Clinic [phone].    

## 2017-09-27 ENCOUNTER — Other Ambulatory Visit: Payer: Self-pay | Admitting: Internal Medicine

## 2017-10-06 ENCOUNTER — Ambulatory Visit: Payer: 59

## 2017-10-06 ENCOUNTER — Ambulatory Visit: Payer: 59 | Admitting: *Deleted

## 2017-10-06 ENCOUNTER — Telehealth: Payer: Self-pay | Admitting: *Deleted

## 2017-10-06 DIAGNOSIS — Z5181 Encounter for therapeutic drug level monitoring: Secondary | ICD-10-CM | POA: Diagnosis not present

## 2017-10-06 DIAGNOSIS — I4891 Unspecified atrial fibrillation: Secondary | ICD-10-CM | POA: Diagnosis not present

## 2017-10-06 DIAGNOSIS — Z7901 Long term (current) use of anticoagulants: Secondary | ICD-10-CM | POA: Diagnosis not present

## 2017-10-06 LAB — POCT INR: INR: 2.4

## 2017-10-06 NOTE — Patient Instructions (Signed)
Description   Continue taking 2 tablets every day except 1 tablet on Mondays, Wednesdays, and Fridays.  Recheck in 4 weeks. Coumadin Clinic [phone].     

## 2017-10-06 NOTE — Telephone Encounter (Signed)
Spoke with patient to cancel Device Clinic wound check appointment for today at 1100.  Patient has not had a recent PPM procedure, current PPM implanted on 08/07/13.  Patient reports he is unsure why this wound check was scheduled and verbally confirmed that he has not had recent PPM procedure or pocket issues.  Advised patient that he also does not need the 91-day f/u appointment that was scheduled with Dr. Lovena Le for 12/28/17.  Patient verbalizes understanding.  He is aware that his next remote f/u via [employer] home monitoring is on 11/16/17, and next f/u with Dr. Lovena Le is on 11/19/17 (1 year f/u per notes).  Patient is appreciative and denies additional questions or concerns at this time.

## 2017-11-02 ENCOUNTER — Other Ambulatory Visit: Payer: Self-pay | Admitting: *Deleted

## 2017-11-02 MED ORDER — VERAPAMIL HCL ER 240 MG PO TBCR: 240.0000 mg | EXTENDED_RELEASE_TABLET | Freq: Every day | ORAL | 2 refills | Status: DC

## 2017-11-02 MED ORDER — DIGOXIN 125 MCG PO TABS: 125.0000 ug | ORAL_TABLET | Freq: Every day | ORAL | 2 refills | Status: DC

## 2017-11-03 ENCOUNTER — Ambulatory Visit: Payer: 59 | Admitting: *Deleted

## 2017-11-03 DIAGNOSIS — Z7901 Long term (current) use of anticoagulants: Secondary | ICD-10-CM | POA: Diagnosis not present

## 2017-11-03 DIAGNOSIS — I481 Persistent atrial fibrillation: Secondary | ICD-10-CM

## 2017-11-03 DIAGNOSIS — Z5181 Encounter for therapeutic drug level monitoring: Secondary | ICD-10-CM | POA: Diagnosis not present

## 2017-11-03 DIAGNOSIS — I4891 Unspecified atrial fibrillation: Secondary | ICD-10-CM

## 2017-11-03 DIAGNOSIS — I4819 Other persistent atrial fibrillation: Secondary | ICD-10-CM

## 2017-11-03 LAB — POCT INR: INR: 2.6

## 2017-11-03 NOTE — Patient Instructions (Signed)
Description   Continue taking 2 tablets everyday except 1 tablet on Mondays, Wednesdays, and Fridays.  Recheck in 6 weeks. Coumadin Clinic [phone].

## 2017-11-16 ENCOUNTER — Ambulatory Visit (INDEPENDENT_AMBULATORY_CARE_PROVIDER_SITE_OTHER): Payer: 59 | Admitting: *Deleted

## 2017-11-16 DIAGNOSIS — I495 Sick sinus syndrome: Secondary | ICD-10-CM

## 2017-11-16 NOTE — Progress Notes (Signed)
Remote pacemaker transmission.   

## 2017-11-17 ENCOUNTER — Encounter: Payer: Self-pay | Admitting: Cardiology

## 2017-11-18 LAB — CUP PACEART REMOTE DEVICE CHECK
Date Time Interrogation Session: 20190423044400
Implantable Lead Implant Date: 20050517
Implantable Lead Location: 753860
Implantable Lead Model: 4086
Implantable Lead Model: 4087
Implantable Lead Serial Number: 237198
Implantable Pulse Generator Implant Date: 20150112
Lead Channel Impedance Value: 417 Ohm
Lead Channel Impedance Value: 615 Ohm
Lead Channel Pacing Threshold Amplitude: 1.2 V
Lead Channel Pacing Threshold Pulse Width: 0.4 ms
Lead Channel Setting Pacing Amplitude: 2.4 V
MDC IDC LEAD IMPLANT DT: 20050517
MDC IDC LEAD LOCATION: 753859
MDC IDC LEAD SERIAL: 218470
MDC IDC MSMT BATTERY REMAINING LONGEVITY: 72 mo
MDC IDC MSMT BATTERY REMAINING PERCENTAGE: 76 %
MDC IDC SET LEADCHNL RV PACING AMPLITUDE: 2.4 V
MDC IDC SET LEADCHNL RV PACING PULSEWIDTH: 0.4 ms
MDC IDC SET LEADCHNL RV SENSING SENSITIVITY: 2.5 mV
MDC IDC STAT BRADY RA PERCENT PACED: 5 %
MDC IDC STAT BRADY RV PERCENT PACED: 16 %
Pulse Gen Serial Number: 389910

## 2017-11-19 ENCOUNTER — Encounter: Payer: 59 | Admitting: Internal Medicine

## 2017-11-26 ENCOUNTER — Telehealth: Payer: Self-pay | Admitting: Internal Medicine

## 2017-11-26 NOTE — Telephone Encounter (Signed)
Copied from [place] [id number]. Topic: Inquiry >> Nov 26, 2017  4:07 PM Phillip Kline wrote: Reason for CRM: pt called to ask if Dr. Sharlet Salina would add him as a new pt, call pt to advise

## 2017-12-03 NOTE — Telephone Encounter (Signed)
Not taking new patients sorry.  

## 2017-12-08 NOTE — Telephone Encounter (Signed)
LVM informing patient.

## 2017-12-15 ENCOUNTER — Ambulatory Visit: Payer: 59 | Admitting: *Deleted

## 2017-12-15 DIAGNOSIS — Z7901 Long term (current) use of anticoagulants: Secondary | ICD-10-CM

## 2017-12-15 DIAGNOSIS — I4891 Unspecified atrial fibrillation: Secondary | ICD-10-CM

## 2017-12-15 DIAGNOSIS — Z5181 Encounter for therapeutic drug level monitoring: Secondary | ICD-10-CM | POA: Diagnosis not present

## 2017-12-15 LAB — POCT INR: INR: 3.2 — AB (ref 2.0–3.0)

## 2017-12-15 NOTE — Patient Instructions (Signed)
Description   Do not take coumadin today May 22nd then continue taking 2 tablets everyday except 1 tablet on Mondays, Wednesdays, and Fridays.  Recheck in 2 weeks. Coumadin Clinic [phone].

## 2017-12-28 ENCOUNTER — Encounter: Payer: 59 | Admitting: Internal Medicine

## 2017-12-29 ENCOUNTER — Ambulatory Visit: Payer: 59 | Admitting: *Deleted

## 2017-12-29 DIAGNOSIS — Z5181 Encounter for therapeutic drug level monitoring: Secondary | ICD-10-CM | POA: Diagnosis not present

## 2017-12-29 DIAGNOSIS — I4891 Unspecified atrial fibrillation: Secondary | ICD-10-CM | POA: Diagnosis not present

## 2017-12-29 DIAGNOSIS — Z7901 Long term (current) use of anticoagulants: Secondary | ICD-10-CM | POA: Diagnosis not present

## 2017-12-29 DIAGNOSIS — I481 Persistent atrial fibrillation: Secondary | ICD-10-CM

## 2017-12-29 DIAGNOSIS — I4819 Other persistent atrial fibrillation: Secondary | ICD-10-CM

## 2017-12-29 LAB — POCT INR: INR: 2.2 (ref 2.0–3.0)

## 2017-12-29 NOTE — Patient Instructions (Signed)
Description   Continue taking 2 tablets everyday except 1 tablet on Mondays, Wednesdays, and Fridays.  Recheck in 4 weeks with MD appt. Coumadin Clinic [phone].

## 2018-01-20 ENCOUNTER — Other Ambulatory Visit: Payer: Self-pay | Admitting: Internal Medicine

## 2018-01-24 ENCOUNTER — Ambulatory Visit (INDEPENDENT_AMBULATORY_CARE_PROVIDER_SITE_OTHER): Payer: 59 | Admitting: *Deleted

## 2018-01-24 ENCOUNTER — Encounter: Payer: Self-pay | Admitting: Internal Medicine

## 2018-01-24 ENCOUNTER — Ambulatory Visit: Payer: 59 | Admitting: Internal Medicine

## 2018-01-24 ENCOUNTER — Encounter (INDEPENDENT_AMBULATORY_CARE_PROVIDER_SITE_OTHER): Payer: Self-pay

## 2018-01-24 VITALS — BP 100/68 | HR 60 | Ht 65.0 in | Wt 151.0 lb

## 2018-01-24 DIAGNOSIS — I495 Sick sinus syndrome: Secondary | ICD-10-CM | POA: Diagnosis not present

## 2018-01-24 DIAGNOSIS — Z7901 Long term (current) use of anticoagulants: Secondary | ICD-10-CM

## 2018-01-24 DIAGNOSIS — I4891 Unspecified atrial fibrillation: Secondary | ICD-10-CM

## 2018-01-24 DIAGNOSIS — Z950 Presence of cardiac pacemaker: Secondary | ICD-10-CM | POA: Diagnosis not present

## 2018-01-24 DIAGNOSIS — Z5181 Encounter for therapeutic drug level monitoring: Secondary | ICD-10-CM | POA: Diagnosis not present

## 2018-01-24 DIAGNOSIS — I10 Essential (primary) hypertension: Secondary | ICD-10-CM | POA: Diagnosis not present

## 2018-01-24 LAB — POCT INR: INR: 1.8 — AB (ref 2.0–3.0)

## 2018-01-24 NOTE — Patient Instructions (Signed)
Medication Instructions:  Your physician recommends that you continue on your current medications as directed. Please refer to the Current Medication list given to you today.  Labwork: None ordered.  Testing/Procedures: None ordered.  Follow-Up: Your physician wants you to follow-up in: one year with Dr. Lovena Le.   You will receive a reminder letter in the mail two months in advance. If you don't receive a letter, please call our office to schedule the follow-up appointment.  Remote monitoring is used to monitor your Pacemaker from home. This monitoring reduces the number of office visits required to check your device to one time per year. It allows [place] to keep an eye on the functioning of your device to ensure it is working properly. You are scheduled for a device check from home on 02/15/2018. You may send your transmission at any time that day. If you have a wireless device, the transmission will be sent automatically. After your physician reviews your transmission, you will receive a postcard with your next transmission date.  Any Other Special Instructions Will Be Listed Below (If Applicable).  If you need a refill on your cardiac medications before your next appointment, please call your pharmacy.

## 2018-01-24 NOTE — Progress Notes (Signed)
HPI [name] returns today for followup. The patient is a pleasant 72 yo man with PAF, HTN, and CAD. He has sinus node dysfunction and is s/p PPM insertion. The patient has felt well in the interim with no chest pain or sob. No syncope.  No Known Allergies   Current Outpatient Medications  Medication Sig Dispense Refill  . atorvastatin (LIPITOR) 80 MG tablet Take 40 mg by mouth at bedtime.     . Calcium Carbonate-Vitamin D (CALCIUM 500/D) 500-125 MG-UNIT TABS Take 1 tablet by mouth 2 (two) times daily.     . digoxin (DIGITEK) 0.125 MG tablet Take 1 tablet (125 mcg total) by mouth daily. 90 tablet 2  . hydrochlorothiazide 25 MG tablet Take 12.5 mg by mouth daily.     Marland Kitchen lisinopril (PRINIVIL,ZESTRIL) 2.5 MG tablet Take 2.5 mg by mouth daily.    . prednisoLONE acetate (PREDNISOLONE ACETATE P-F) 1 % ophthalmic suspension Place 1 drop into both eyes 4 (four) times daily.    . QUEtiapine (SEROQUEL) 25 MG tablet Take 25 mg by mouth at bedtime. Takes 1/2 tablet QHS    . sertraline (ZOLOFT) 100 MG tablet Take 100 mg by mouth daily. Taking 1.5 tablets everyday    . sildenafil (VIAGRA) 25 MG tablet Take 12.5 mg by mouth daily as needed for erectile dysfunction.    . thiamine 100 MG tablet Take 100 mg by mouth daily.    . verapamil (CALAN-SR) 240 MG CR tablet Take 1 tablet (240 mg total) by mouth at bedtime. 90 tablet 2  . vitamin B-12 (CYANOCOBALAMIN) 500 MCG tablet Take 500 mcg by mouth 2 (two) times daily.    Marland Kitchen warfarin (COUMADIN) 2 MG tablet TAKE AS DIRECTED BY COUMADIN CLINIC 50 tablet 3   Current Facility-Administered Medications  Medication Dose Route Frequency Provider Last Rate Last Dose  . 0.9 %  sodium chloride infusion  500 mL Intravenous Continuous Lucio Edward T, MD      . 0.9 %  sodium chloride infusion  500 mL Intravenous Continuous Ladene Artist, MD         Past Medical History:  Diagnosis Date  . Abdominal hernia   . Anxiety   . Arthritis   . Atrial fibrillation  ([place])   . Atrial flutter ([place])   . Blood transfusion without reported diagnosis   . Cataract    bilateral cataracts removed  . Clotting disorder ([facility])    coumadin - a fib  . Coronary artery disease   . Depression   . Hyperlipidemia   . Hypertension   . Pacemaker    [employer]  . Sinus bradycardia   . Sleep apnea    wears a c-pap    ROS:   All systems reviewed and negative except as noted in the HPI.   Past Surgical History:  Procedure Laterality Date  . CATARACT EXTRACTION    . COLONOSCOPY    . hernia repair    . PERMANENT PACEMAKER GENERATOR CHANGE N/A 08/07/2013   Procedure: PERMANENT PACEMAKER GENERATOR CHANGE;  Surgeon: Evans Lance, MD;  Location: [facility] C[facility];  Service: Cardiovascular;  Laterality: N/A;  . PERMANENT PACEMAKER INSERTION  2005; 08/07/2013   BSX dual chamber pacemaker implanted 2005 for symptomatic bradycardia; gen change 2015 by Dr Lovena Le  . REPLACEMENT TOTAL KNEE     right knee  . thumb surgery     left and right  . TONSILLECTOMY       Family History  Problem Relation Age of Onset  . Cancer Father        lung cancer  . Stomach cancer Mother   . Stomach cancer Sister   . Colon cancer Neg Hx   . Esophageal cancer Neg Hx   . Pancreatic cancer Neg Hx   . Prostate cancer Neg Hx   . Rectal cancer Neg Hx      Social History   Socioeconomic History  . Marital status: Single    Spouse name: Not on file  . Number of children: Not on file  . Years of education: Not on file  . Highest education level: Not on file  Occupational History  . Not on file  Social Needs  . Financial resource strain: Not on file  . Food insecurity:    Worry: Not on file    Inability: Not on file  . Transportation needs:    Medical: Not on file    Non-medical: Not on file  Tobacco Use  . Smoking status: Current Some Day Smoker    Types: E-cigarettes  . Smokeless tobacco: Never Used  . Tobacco comment: Marijuana  Substance and Sexual Activity  .  Alcohol use: Yes    Comment: 4-5 beers weekly  . Drug use: Yes    Types: Marijuana    Comment: smoked marijuana 09-26-16  . Sexual activity: Not on file  Lifestyle  . Physical activity:    Days per week: Not on file    Minutes per session: Not on file  . Stress: Not on file  Relationships  . Social connections:    Talks on phone: Not on file    Gets together: Not on file    Attends religious service: Not on file    Active member of club or organization: Not on file    Attends meetings of clubs or organizations: Not on file    Relationship status: Not on file  . Intimate partner violence:    Fear of current or ex partner: Not on file    Emotionally abused: Not on file    Physically abused: Not on file    Forced sexual activity: Not on file  Other Topics Concern  . Not on file  Social History Narrative  . Not on file     BP 100/68   Pulse 60   Ht 5\' 5"  (1.651 m)   Wt 151 lb (68.5 kg)   SpO2 96%   BMI 25.13 kg/m   Physical Exam:  Well appearing NAD HEENT: Unremarkable Neck:  No JVD, no thyromegally Lymphatics:  No adenopathy Back:  No CVA tenderness Lungs:  Clear HEART:  Regular rate rhythm, 3/6 blowing holosystolic murmur, no rubs, no clicks Abd:  soft, positive bowel sounds, no organomegally, no rebound, no guarding Ext:  2 plus pulses, no edema, no cyanosis, no clubbing Skin:  No rashes no nodules Neuro:  CN II through XII intact, motor grossly intact  DEVICE  Normal device function.  See PaceArt for details.   Assess/Plan: 1. Atrial fib - he is maintaining NSR. He will continue his current meds.  2. MR - his 2D echo 6 months ago demonstrates moderate MR. He is asymptomatic. We will follow. 3. HTN - his blood pressure is well controlled.  4. PPM - his [employer] DDD PM is working normally except for some far field oversensing of the atrium which we were able to program around.  Mikle Bosworth.D.

## 2018-01-24 NOTE — Patient Instructions (Signed)
Description   Today take 1.5 tablets then continue taking 2 tablets everyday except 1 tablet on Mondays, Wednesdays, and Fridays.  Recheck in 3 weeks. [facility] [phone].

## 2018-02-08 LAB — CUP PACEART INCLINIC DEVICE CHECK
Implantable Lead Implant Date: 20050517
Implantable Lead Location: 753859
Implantable Lead Model: 4087
Implantable Lead Serial Number: 218470
Implantable Pulse Generator Implant Date: 20150112
Lead Channel Setting Pacing Amplitude: 2.4 V
Lead Channel Setting Pacing Amplitude: 2.4 V
Lead Channel Setting Pacing Pulse Width: 0.4 ms
MDC IDC LEAD IMPLANT DT: 20050517
MDC IDC LEAD LOCATION: 753860
MDC IDC LEAD SERIAL: 237198
MDC IDC SESS DTM: 20190716141601
MDC IDC SET LEADCHNL RV SENSING SENSITIVITY: 2.5 mV
Pulse Gen Serial Number: 389910

## 2018-02-14 ENCOUNTER — Ambulatory Visit (INDEPENDENT_AMBULATORY_CARE_PROVIDER_SITE_OTHER): Payer: 59 | Admitting: Pharmacist

## 2018-02-14 DIAGNOSIS — Z5181 Encounter for therapeutic drug level monitoring: Secondary | ICD-10-CM | POA: Diagnosis not present

## 2018-02-14 DIAGNOSIS — I4891 Unspecified atrial fibrillation: Secondary | ICD-10-CM

## 2018-02-14 DIAGNOSIS — Z7901 Long term (current) use of anticoagulants: Secondary | ICD-10-CM

## 2018-02-14 LAB — POCT INR: INR: 2.8 (ref 2.0–3.0)

## 2018-02-14 NOTE — Patient Instructions (Signed)
Description   Continue taking 2 tablets every day except 1 tablet on Mondays, Wednesdays, and Fridays.  Recheck in 4 weeks. Coumadin Clinic [phone].     

## 2018-02-15 ENCOUNTER — Ambulatory Visit (INDEPENDENT_AMBULATORY_CARE_PROVIDER_SITE_OTHER): Payer: 59 | Admitting: *Deleted

## 2018-02-15 DIAGNOSIS — I4891 Unspecified atrial fibrillation: Secondary | ICD-10-CM

## 2018-02-15 DIAGNOSIS — I495 Sick sinus syndrome: Secondary | ICD-10-CM

## 2018-02-15 NOTE — Progress Notes (Signed)
Remote pacemaker transmission.   

## 2018-03-14 ENCOUNTER — Ambulatory Visit (INDEPENDENT_AMBULATORY_CARE_PROVIDER_SITE_OTHER): Payer: 59 | Admitting: *Deleted

## 2018-03-14 DIAGNOSIS — Z5181 Encounter for therapeutic drug level monitoring: Secondary | ICD-10-CM

## 2018-03-14 DIAGNOSIS — I4891 Unspecified atrial fibrillation: Secondary | ICD-10-CM | POA: Diagnosis not present

## 2018-03-14 DIAGNOSIS — Z7901 Long term (current) use of anticoagulants: Secondary | ICD-10-CM

## 2018-03-14 LAB — POCT INR: INR: 1.7 — AB (ref 2.0–3.0)

## 2018-03-14 NOTE — Patient Instructions (Signed)
Description   Today take extra 1/2 tablet (already taken today's dose) then continue taking 2 tablets everyday except 1 tablet on Mondays, Wednesdays, and Fridays.  Recheck in 3 weeks. [facility] [phone].

## 2018-03-26 LAB — CUP PACEART REMOTE DEVICE CHECK
Battery Remaining Longevity: 102 mo
Brady Statistic RV Percent Paced: 4 %
Date Time Interrogation Session: 20190723052100
Implantable Lead Implant Date: 20050517
Implantable Lead Location: 753860
Implantable Lead Model: 4087
Implantable Pulse Generator Implant Date: 20150112
Lead Channel Impedance Value: 435 Ohm
Lead Channel Setting Pacing Amplitude: 2.4 V
Lead Channel Setting Pacing Amplitude: 2.4 V
Lead Channel Setting Pacing Pulse Width: 0.4 ms
MDC IDC LEAD IMPLANT DT: 20050517
MDC IDC LEAD LOCATION: 753859
MDC IDC LEAD SERIAL: 218470
MDC IDC LEAD SERIAL: 237198
MDC IDC MSMT BATTERY REMAINING PERCENTAGE: 100 %
MDC IDC MSMT LEADCHNL RA PACING THRESHOLD AMPLITUDE: 1.6 V
MDC IDC MSMT LEADCHNL RA PACING THRESHOLD PULSEWIDTH: 0.4 ms
MDC IDC MSMT LEADCHNL RV IMPEDANCE VALUE: 634 Ohm
MDC IDC SET LEADCHNL RV SENSING SENSITIVITY: 2.5 mV
MDC IDC STAT BRADY RA PERCENT PACED: 98 %
Pulse Gen Serial Number: 389910

## 2018-03-30 ENCOUNTER — Encounter (HOSPITAL_COMMUNITY): Payer: Self-pay | Admitting: Pharmacy Technician

## 2018-03-30 ENCOUNTER — Emergency Department (HOSPITAL_COMMUNITY): Payer: No Typology Code available for payment source

## 2018-03-30 ENCOUNTER — Inpatient Hospital Stay (HOSPITAL_COMMUNITY)
Admission: EM | Admit: 2018-03-30 | Discharge: 2018-04-02 | DRG: 184 | Disposition: A | Discharge status: Home Health | Payer: No Typology Code available for payment source | Attending: General Surgery | Admitting: General Surgery

## 2018-03-30 ENCOUNTER — Other Ambulatory Visit: Payer: Self-pay

## 2018-03-30 DIAGNOSIS — Z96651 Presence of right artificial knee joint: Secondary | ICD-10-CM | POA: Diagnosis present

## 2018-03-30 DIAGNOSIS — S7011XA Contusion of right thigh, initial encounter: Secondary | ICD-10-CM | POA: Diagnosis present

## 2018-03-30 DIAGNOSIS — F17290 Nicotine dependence, other tobacco product, uncomplicated: Secondary | ICD-10-CM | POA: Diagnosis present

## 2018-03-30 DIAGNOSIS — S2239XA Fracture of one rib, unspecified side, initial encounter for closed fracture: Secondary | ICD-10-CM

## 2018-03-30 DIAGNOSIS — S0081XA Abrasion of other part of head, initial encounter: Secondary | ICD-10-CM | POA: Diagnosis present

## 2018-03-30 DIAGNOSIS — Z800 Family history of malignant neoplasm of digestive organs: Secondary | ICD-10-CM

## 2018-03-30 DIAGNOSIS — M546 Pain in thoracic spine: Secondary | ICD-10-CM | POA: Diagnosis present

## 2018-03-30 DIAGNOSIS — D62 Acute posthemorrhagic anemia: Secondary | ICD-10-CM | POA: Diagnosis present

## 2018-03-30 DIAGNOSIS — S80812A Abrasion, left lower leg, initial encounter: Secondary | ICD-10-CM | POA: Diagnosis present

## 2018-03-30 DIAGNOSIS — Y92410 Unspecified street and highway as the place of occurrence of the external cause: Secondary | ICD-10-CM

## 2018-03-30 DIAGNOSIS — R509 Fever, unspecified: Secondary | ICD-10-CM | POA: Diagnosis present

## 2018-03-30 DIAGNOSIS — I959 Hypotension, unspecified: Secondary | ICD-10-CM | POA: Diagnosis present

## 2018-03-30 DIAGNOSIS — M549 Dorsalgia, unspecified: Secondary | ICD-10-CM

## 2018-03-30 DIAGNOSIS — Z801 Family history of malignant neoplasm of trachea, bronchus and lung: Secondary | ICD-10-CM

## 2018-03-30 DIAGNOSIS — M62838 Other muscle spasm: Secondary | ICD-10-CM | POA: Diagnosis present

## 2018-03-30 DIAGNOSIS — Z950 Presence of cardiac pacemaker: Secondary | ICD-10-CM

## 2018-03-30 DIAGNOSIS — I4891 Unspecified atrial fibrillation: Secondary | ICD-10-CM | POA: Diagnosis present

## 2018-03-30 DIAGNOSIS — R079 Chest pain, unspecified: Secondary | ICD-10-CM

## 2018-03-30 DIAGNOSIS — Z7901 Long term (current) use of anticoagulants: Secondary | ICD-10-CM

## 2018-03-30 DIAGNOSIS — E785 Hyperlipidemia, unspecified: Secondary | ICD-10-CM | POA: Diagnosis present

## 2018-03-30 DIAGNOSIS — S2241XA Multiple fractures of ribs, right side, initial encounter for closed fracture: Secondary | ICD-10-CM | POA: Diagnosis not present

## 2018-03-30 DIAGNOSIS — I10 Essential (primary) hypertension: Secondary | ICD-10-CM | POA: Diagnosis present

## 2018-03-30 DIAGNOSIS — S80811A Abrasion, right lower leg, initial encounter: Secondary | ICD-10-CM | POA: Diagnosis present

## 2018-03-30 DIAGNOSIS — S2249XA Multiple fractures of ribs, unspecified side, initial encounter for closed fracture: Secondary | ICD-10-CM

## 2018-03-30 LAB — COMPREHENSIVE METABOLIC PANEL
ALBUMIN: 3.8 g/dL (ref 3.5–5.0)
ALK PHOS: 64 U/L (ref 38–126)
ALT: 17 U/L (ref 0–44)
ANION GAP: 11 (ref 5–15)
AST: 29 U/L (ref 15–41)
BUN: 16 mg/dL (ref 8–23)
CALCIUM: 9.3 mg/dL (ref 8.9–10.3)
CO2: 26 mmol/L (ref 22–32)
Chloride: 101 mmol/L (ref 98–111)
Creatinine, Ser: 1.14 mg/dL (ref 0.61–1.24)
GFR calc non Af Amer: >60 mL/min (ref >60)
GLUCOSE: 134 mg/dL — AB (ref 70–99)
POTASSIUM: 3.8 mmol/L (ref 3.5–5.1)
Sodium: 138 mmol/L (ref 135–145)
Total Bilirubin: 1 mg/dL (ref 0.3–1.2)
Total Protein: 6.7 g/dL (ref 6.5–8.1)

## 2018-03-30 LAB — ABO/RH: ABO/RH(D): O POS

## 2018-03-30 LAB — CBC
HCT: 41.7 % (ref 39.0–52.0)
HEMOGLOBIN: 13.7 g/dL (ref 13.0–17.0)
MCH: 33.7 pg (ref 26.0–34.0)
MCHC: 32.9 g/dL (ref 30.0–36.0)
MCV: 102.5 fL — ABNORMAL HIGH (ref 78.0–100.0)
Platelets: 170 10*3/uL (ref 150–400)
RBC: 4.07 MIL/uL — ABNORMAL LOW (ref 4.22–5.81)
RDW: 13.4 % (ref 11.5–15.5)
WBC: 6.7 10*3/uL (ref 4.0–10.5)

## 2018-03-30 LAB — I-STAT CG4 LACTIC ACID, ED: Lactic Acid, Venous: 2.73 mmol/L (ref 0.5–1.9)

## 2018-03-30 LAB — PROTIME-INR
INR: 2.13
Prothrombin Time: 23.7 seconds — ABNORMAL HIGH (ref 11.4–15.2)

## 2018-03-30 LAB — I-STAT TROPONIN, ED: TROPONIN I, POC: 0.02 ng/mL (ref 0.00–0.08)

## 2018-03-30 LAB — TYPE AND SCREEN
ABO/RH(D): O POS
Antibody Screen: NEGATIVE

## 2018-03-30 LAB — LIPASE, BLOOD: Lipase: 35 U/L (ref 11–51)

## 2018-03-30 MED ORDER — VITAMIN B-12 1000 MCG PO TABS
1000.0000 ug | ORAL_TABLET | Freq: Every day | ORAL | Status: DC
  Administered 2018-03-31 – 2018-04-02 (×3): 1000 ug via ORAL
  Filled 2018-03-30 (×3): qty 1

## 2018-03-30 MED ORDER — PREDNISOLONE ACETATE 1 % OP SUSP
1.0000 [drp] | OPHTHALMIC | Status: DC
  Administered 2018-03-31: 1 [drp] via OPHTHALMIC
  Filled 2018-03-30: qty 5

## 2018-03-30 MED ORDER — SODIUM CHLORIDE 0.9 % IV SOLN: 250.0000 mL | INTRAVENOUS | Status: DC | PRN

## 2018-03-30 MED ORDER — HYDROMORPHONE HCL 1 MG/ML IJ SOLN
0.5000 mg | Freq: Once | INTRAMUSCULAR | Status: AC
  Administered 2018-03-30: 0.5 mg via INTRAVENOUS
  Filled 2018-03-30: qty 1

## 2018-03-30 MED ORDER — SODIUM CHLORIDE 0.9% FLUSH: 3.0000 mL | INTRAVENOUS | Status: DC | PRN

## 2018-03-30 MED ORDER — HYDROCHLOROTHIAZIDE 12.5 MG PO CAPS
12.5000 mg | ORAL_CAPSULE | Freq: Every day | ORAL | Status: DC
  Administered 2018-03-31: 12.5 mg via ORAL
  Filled 2018-03-30: qty 1

## 2018-03-30 MED ORDER — IOPAMIDOL (ISOVUE-370) INJECTION 76%
INTRAVENOUS | Status: AC
  Filled 2018-03-30: qty 100

## 2018-03-30 MED ORDER — LISINOPRIL 2.5 MG PO TABS
2.5000 mg | ORAL_TABLET | Freq: Every day | ORAL | Status: DC
  Administered 2018-03-31: 2.5 mg via ORAL
  Filled 2018-03-30: qty 1

## 2018-03-30 MED ORDER — SODIUM CHLORIDE 0.9 % IV BOLUS
1000.0000 mL | Freq: Once | INTRAVENOUS | Status: AC
  Administered 2018-03-30: 1000 mL via INTRAVENOUS

## 2018-03-30 MED ORDER — OXYCODONE HCL 5 MG PO TABS
5.0000 mg | ORAL_TABLET | ORAL | Status: DC | PRN
  Administered 2018-03-31 – 2018-04-02 (×9): 5 mg via ORAL
  Filled 2018-03-30 (×9): qty 1

## 2018-03-30 MED ORDER — IOPAMIDOL (ISOVUE-370) INJECTION 76%
100.0000 mL | Freq: Once | INTRAVENOUS | Status: AC | PRN
  Administered 2018-03-30: 100 mL via INTRAVENOUS

## 2018-03-30 MED ORDER — QUETIAPINE 12.5 MG HALF TABLET
12.5000 mg | ORAL_TABLET | Freq: Every day | ORAL | Status: DC
  Administered 2018-03-30 – 2018-04-01 (×3): 12.5 mg via ORAL
  Filled 2018-03-30 (×4): qty 1

## 2018-03-30 MED ORDER — SODIUM CHLORIDE 0.9% FLUSH
3.0000 mL | Freq: Two times a day (BID) | INTRAVENOUS | Status: DC
  Administered 2018-03-31 – 2018-04-01 (×4): 3 mL via INTRAVENOUS

## 2018-03-30 MED ORDER — VITAMIN B-1 100 MG PO TABS
100.0000 mg | ORAL_TABLET | Freq: Every day | ORAL | Status: DC
  Administered 2018-03-31 – 2018-04-02 (×3): 100 mg via ORAL
  Filled 2018-03-30 (×3): qty 1

## 2018-03-30 MED ORDER — CALCIUM CARBONATE-VITAMIN D 500-200 MG-UNIT PO TABS
1.0000 | ORAL_TABLET | Freq: Two times a day (BID) | ORAL | Status: DC
  Administered 2018-03-31 – 2018-04-02 (×5): 1 via ORAL
  Filled 2018-03-30 (×5): qty 1

## 2018-03-30 MED ORDER — TRAZODONE HCL 50 MG PO TABS
25.0000 mg | ORAL_TABLET | Freq: Every evening | ORAL | Status: DC | PRN
  Administered 2018-03-30 – 2018-03-31 (×2): 25 mg via ORAL
  Filled 2018-03-30 (×2): qty 1

## 2018-03-30 MED ORDER — DIGOXIN 125 MCG PO TABS
125.0000 ug | ORAL_TABLET | Freq: Every day | ORAL | Status: DC
  Administered 2018-03-31 – 2018-04-02 (×3): 125 ug via ORAL
  Filled 2018-03-30 (×3): qty 1

## 2018-03-30 MED ORDER — SERTRALINE HCL 100 MG PO TABS
200.0000 mg | ORAL_TABLET | Freq: Every day | ORAL | Status: DC
  Administered 2018-03-31 – 2018-04-02 (×3): 200 mg via ORAL
  Filled 2018-03-30 (×3): qty 2

## 2018-03-30 MED ORDER — OXYCODONE HCL 5 MG PO TABS: 2.5000 mg | ORAL_TABLET | ORAL | Status: DC | PRN

## 2018-03-30 MED ORDER — VERAPAMIL HCL ER 240 MG PO TBCR
240.0000 mg | EXTENDED_RELEASE_TABLET | Freq: Every day | ORAL | Status: DC
  Administered 2018-03-30 – 2018-03-31 (×2): 240 mg via ORAL
  Filled 2018-03-30 (×2): qty 1

## 2018-03-30 MED ORDER — ATORVASTATIN CALCIUM 40 MG PO TABS
40.0000 mg | ORAL_TABLET | Freq: Every day | ORAL | Status: DC
  Administered 2018-03-30 – 2018-04-01 (×3): 40 mg via ORAL
  Filled 2018-03-30 (×3): qty 1

## 2018-03-30 MED ORDER — HYDROMORPHONE HCL 1 MG/ML IJ SOLN
0.5000 mg | INTRAMUSCULAR | Status: DC | PRN
  Administered 2018-03-30: 0.5 mg via INTRAVENOUS
  Filled 2018-03-30: qty 1
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
## 2018-03-30 NOTE — ED Notes (Signed)
Patient transported to CT 
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
## 2018-03-30 NOTE — ED Notes (Signed)
ED Provider at bedside. 

## 2018-03-30 NOTE — ED Notes (Signed)
Xray at the bedside.
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
## 2018-03-31 ENCOUNTER — Observation Stay (HOSPITAL_COMMUNITY): Payer: No Typology Code available for payment source

## 2018-03-31 ENCOUNTER — Encounter (HOSPITAL_COMMUNITY): Payer: Self-pay

## 2018-03-31 ENCOUNTER — Other Ambulatory Visit: Payer: Self-pay

## 2018-03-31 LAB — CBC
HCT: 34.6 % — ABNORMAL LOW (ref 39.0–52.0)
Hemoglobin: 11.3 g/dL — ABNORMAL LOW (ref 13.0–17.0)
MCH: 33.9 pg (ref 26.0–34.0)
MCHC: 32.7 g/dL (ref 30.0–36.0)
MCV: 103.9 fL — ABNORMAL HIGH (ref 78.0–100.0)
PLATELETS: 156 10*3/uL (ref 150–400)
RBC: 3.33 MIL/uL — AB (ref 4.22–5.81)
RDW: 13.4 % (ref 11.5–15.5)
WBC: 8.6 10*3/uL (ref 4.0–10.5)

## 2018-03-31 LAB — PROTIME-INR
INR: 2.28
PROTHROMBIN TIME: 24.9 s — AB (ref 11.4–15.2)

## 2018-03-31 LAB — MRSA PCR SCREENING: MRSA by PCR: NEGATIVE

## 2018-03-31 LAB — BASIC METABOLIC PANEL
ANION GAP: 7 (ref 5–15)
BUN: 14 mg/dL (ref 8–23)
CALCIUM: 8.2 mg/dL — AB (ref 8.9–10.3)
CO2: 26 mmol/L (ref 22–32)
Chloride: 105 mmol/L (ref 98–111)
Creatinine, Ser: 1.03 mg/dL (ref 0.61–1.24)
GFR calc Af Amer: >60 mL/min (ref >60)
Glucose, Bld: 133 mg/dL — ABNORMAL HIGH (ref 70–99)
POTASSIUM: 3.7 mmol/L (ref 3.5–5.1)
SODIUM: 138 mmol/L (ref 135–145)
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
## 2018-03-31 NOTE — Plan of Care (Signed)
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
## 2018-03-31 NOTE — Plan of Care (Signed)
  Problem: Clinical Measurements: Goal: Ability to maintain clinical measurements within normal limits will improve Outcome: Progressing   Problem: Coping: Goal: Level of anxiety will decrease Outcome: Not Progressing   Problem: Pain Managment: Goal: General experience of comfort will improve Outcome: Not Progressing

## 2018-04-01 ENCOUNTER — Encounter (HOSPITAL_COMMUNITY): Payer: Self-pay

## 2018-04-01 ENCOUNTER — Observation Stay (HOSPITAL_COMMUNITY): Payer: No Typology Code available for payment source

## 2018-04-01 DIAGNOSIS — Z800 Family history of malignant neoplasm of digestive organs: Secondary | ICD-10-CM | POA: Diagnosis not present

## 2018-04-01 DIAGNOSIS — R509 Fever, unspecified: Secondary | ICD-10-CM | POA: Diagnosis present

## 2018-04-01 DIAGNOSIS — S0081XA Abrasion of other part of head, initial encounter: Secondary | ICD-10-CM | POA: Diagnosis present

## 2018-04-01 DIAGNOSIS — F17290 Nicotine dependence, other tobacco product, uncomplicated: Secondary | ICD-10-CM | POA: Diagnosis present

## 2018-04-01 DIAGNOSIS — S2239XA Fracture of one rib, unspecified side, initial encounter for closed fracture: Secondary | ICD-10-CM | POA: Diagnosis present

## 2018-04-01 DIAGNOSIS — Z950 Presence of cardiac pacemaker: Secondary | ICD-10-CM | POA: Diagnosis not present

## 2018-04-01 DIAGNOSIS — I10 Essential (primary) hypertension: Secondary | ICD-10-CM | POA: Diagnosis present

## 2018-04-01 DIAGNOSIS — S80812A Abrasion, left lower leg, initial encounter: Secondary | ICD-10-CM | POA: Diagnosis present

## 2018-04-01 DIAGNOSIS — S7011XA Contusion of right thigh, initial encounter: Secondary | ICD-10-CM | POA: Diagnosis present

## 2018-04-01 DIAGNOSIS — S2241XA Multiple fractures of ribs, right side, initial encounter for closed fracture: Secondary | ICD-10-CM | POA: Diagnosis present

## 2018-04-01 DIAGNOSIS — E785 Hyperlipidemia, unspecified: Secondary | ICD-10-CM | POA: Diagnosis present

## 2018-04-01 DIAGNOSIS — M546 Pain in thoracic spine: Secondary | ICD-10-CM | POA: Diagnosis present

## 2018-04-01 DIAGNOSIS — I4891 Unspecified atrial fibrillation: Secondary | ICD-10-CM | POA: Diagnosis present

## 2018-04-01 DIAGNOSIS — Z7901 Long term (current) use of anticoagulants: Secondary | ICD-10-CM | POA: Diagnosis not present

## 2018-04-01 DIAGNOSIS — S80811A Abrasion, right lower leg, initial encounter: Secondary | ICD-10-CM | POA: Diagnosis present

## 2018-04-01 DIAGNOSIS — Z96651 Presence of right artificial knee joint: Secondary | ICD-10-CM | POA: Diagnosis present

## 2018-04-01 DIAGNOSIS — D62 Acute posthemorrhagic anemia: Secondary | ICD-10-CM | POA: Diagnosis present

## 2018-04-01 DIAGNOSIS — I959 Hypotension, unspecified: Secondary | ICD-10-CM | POA: Diagnosis present

## 2018-04-01 DIAGNOSIS — Y92410 Unspecified street and highway as the place of occurrence of the external cause: Secondary | ICD-10-CM | POA: Diagnosis not present

## 2018-04-01 DIAGNOSIS — Z801 Family history of malignant neoplasm of trachea, bronchus and lung: Secondary | ICD-10-CM | POA: Diagnosis not present

## 2018-04-01 DIAGNOSIS — M62838 Other muscle spasm: Secondary | ICD-10-CM | POA: Diagnosis present

## 2018-04-01 LAB — BASIC METABOLIC PANEL
Anion gap: 8 (ref 5–15)
BUN: 12 mg/dL (ref 8–23)
CO2: 29 mmol/L (ref 22–32)
Calcium: 8.6 mg/dL — ABNORMAL LOW (ref 8.9–10.3)
Chloride: 101 mmol/L (ref 98–111)
Creatinine, Ser: 1 mg/dL (ref 0.61–1.24)
Glucose, Bld: 128 mg/dL — ABNORMAL HIGH (ref 70–99)
POTASSIUM: 4.3 mmol/L (ref 3.5–5.1)
SODIUM: 138 mmol/L (ref 135–145)

## 2018-04-01 LAB — CBC
HCT: 33.5 % — ABNORMAL LOW (ref 39.0–52.0)
HEMOGLOBIN: 11 g/dL — AB (ref 13.0–17.0)
MCH: 33.7 pg (ref 26.0–34.0)
MCHC: 32.8 g/dL (ref 30.0–36.0)
MCV: 102.8 fL — ABNORMAL HIGH (ref 78.0–100.0)
Platelets: 132 10*3/uL — ABNORMAL LOW (ref 150–400)
RBC: 3.26 MIL/uL — AB (ref 4.22–5.81)
RDW: 13.4 % (ref 11.5–15.5)
WBC: 9.1 10*3/uL (ref 4.0–10.5)

## 2018-04-01 LAB — PROTIME-INR
INR: 1.69
PROTHROMBIN TIME: 19.8 s — AB (ref 11.4–15.2)

## 2018-04-01 MED ORDER — METHOCARBAMOL 500 MG PO TABS
500.0000 mg | ORAL_TABLET | Freq: Three times a day (TID) | ORAL | Status: DC
  Administered 2018-04-01 – 2018-04-02 (×4): 500 mg via ORAL
  Filled 2018-04-01 (×4): qty 1

## 2018-04-01 MED ORDER — ACETAMINOPHEN 325 MG PO TABS
650.0000 mg | ORAL_TABLET | Freq: Four times a day (QID) | ORAL | Status: DC | PRN
  Administered 2018-04-01 – 2018-04-02 (×2): 650 mg via ORAL
  Filled 2018-04-01 (×2): qty 2

## 2018-04-01 NOTE — Progress Notes (Signed)
Pt A&OX4 temp=101.63F. Dr. Grandville Silos was made aware and ordered tylenol 650mg  po q 6hrs prn.
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
## 2018-04-02 ENCOUNTER — Encounter (HOSPITAL_COMMUNITY): Payer: Self-pay | Admitting: *Deleted

## 2018-04-02 LAB — PROTIME-INR
INR: 1.4
PROTHROMBIN TIME: 17 s — AB (ref 11.4–15.2)

## 2018-04-02 MED ORDER — METHOCARBAMOL 500 MG PO TABS: 500.0000 mg | ORAL_TABLET | Freq: Three times a day (TID) | ORAL | 0 refills | Status: DC | PRN

## 2018-04-02 MED ORDER — ACETAMINOPHEN 325 MG PO TABS: 650.0000 mg | ORAL_TABLET | Freq: Four times a day (QID) | ORAL | Status: AC | PRN

## 2018-04-02 MED ORDER — OXYCODONE HCL 5 MG PO TABS: 5.0000 mg | ORAL_TABLET | Freq: Four times a day (QID) | ORAL | 0 refills | Status: DC | PRN
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
## 2018-04-02 NOTE — Progress Notes (Signed)
Patient discharged to home with sister after vertebralis understanding of discharge instructions.  Encouraged patient not to be drinking especially when taking pain medications and Coumadin.  Sister encouraged also "  He is lucky to be alive."

## 2018-04-02 NOTE — Discharge Instructions (Signed)
Resume coumadin on Monday 04/04/18, follow up with your primary care provider next week to have your INR checked and to have your blood pressure checked. Stop taking your blood pressure medications until you follow up with your doctor.    Rib Fracture A rib fracture is a break or crack in one of the bones of the ribs. The ribs are like a cage that goes around your upper chest. A broken or cracked rib is often painful, but most do not cause other problems. Most rib fractures heal on their own in 1-3 months. Follow these instructions at home:  Avoid activities that cause pain to the injured area. Protect your injured area.  Slowly increase activity as told by your doctor.  Take medicine as told by your doctor.  Put ice on the injured area for the first 1-2 days after you have been treated or as told by your doctor. ? Put ice in a plastic bag. ? Place a towel between your skin and the bag. ? Leave the ice on for 15-20 minutes at a time, every 2 hours while you are awake.  Do deep breathing as told by your doctor. You may be told to: ? Take deep breaths many times a day. ? Cough many times a day while hugging a pillow. ? Use a device (incentive spirometer) to perform deep breathing many times a day.  Drink enough fluids to keep your pee (urine) clear or pale yellow.  Do not wear a rib belt or binder. These do not allow you to breathe deeply. Get help right away if:  You have a fever.  You have trouble breathing.  You cannot stop coughing.  You cough up thick or bloody spit (mucus).  You feel sick to your stomach (nauseous), throw up (vomit), or have belly (abdominal) pain.  Your pain gets worse and medicine does not help. This information is not intended to replace advice given to you by your health care provider. Make sure you discuss any questions you have with your health care provider. Document Released: 04/21/2008 Document Revised: 12/19/2015 Document Reviewed:  09/14/2012 Elsevier Interactive Patient Education  2018 [place].   Hematoma A hematoma is a collection of blood. The collection of blood can turn into a hard, painful lump under the skin. Your skin may turn blue or yellow if the hematoma is close to the surface of the skin. Most hematomas get better in a few days to weeks. Some hematomas are serious and need medical care. Hematomas can be very small or very big. Follow these instructions at home:  Apply ice to the injured area: ? Put ice in a plastic bag. ? Place a towel between your skin and the bag. ? Leave the ice on for 20 minutes, 2-3 times a day for the first 1 to 2 days.  After the first 2 days, switch to using warm packs on the injured area.  Raise (elevate) the injured area to lessen pain and puffiness (swelling). You may also wrap the area with an elastic bandage. Make sure the bandage is not wrapped too tight.  If you have a painful hematoma on your leg or foot, you may use crutches for a couple days.  Only take medicines as told by your doctor. Get help right away if:  Your pain gets worse.  Your pain is not controlled with medicine.  You have a fever.  Your puffiness gets worse.  Your skin turns more blue or yellow.  Your skin over  the hematoma breaks or starts bleeding.  Your hematoma is in your chest or belly (abdomen) and you are short of breath, feel weak, or have a change in consciousness.  Your hematoma is on your scalp and you have a headache that gets worse or a change in alertness or consciousness. This information is not intended to replace advice given to you by your health care provider. Make sure you discuss any questions you have with your health care provider. Document Released: 08/20/2004 Document Revised: 12/19/2015 Document Reviewed: 12/21/2012 Elsevier Interactive Patient Education  2017 [employer].   1. PAIN CONTROL:  1. Pain is best controlled by a usual combination of three  different methods TOGETHER:  1. Ice/Heat 2. Over the counter pain medication 3. Prescription pain medication 2. Most patients will experience some swelling and bruising around wounds. Ice packs or heating pads (30-60 minutes up to 6 times a day) will help. Use ice for the first few days to help decrease swelling and bruising, then switch to heat to help relax tight/sore spots and speed recovery. Some people prefer to use ice alone, heat alone, alternating between ice & heat. Experiment to what works for you. Swelling and bruising can take several weeks to resolve.  3. It is helpful to take an over-the-counter pain medication regularly for the first few weeks. Choose one of the following that works best for you:  1. Naproxen (Aleve, etc) Two 220mg  tabs twice a day 2. Ibuprofen (Advil, etc) Three 200mg  tabs four times a day (every meal & bedtime) 3. Acetaminophen (Tylenol, etc) 500-650mg  four times a day (every meal & bedtime) 4. A prescription for pain medication (such as oxycodone, hydrocodone, etc) should be given to you upon discharge. Take your pain medication as prescribed.  1. If you are having problems/concerns with the prescription medicine (does not control pain, nausea, vomiting, rash, itching, etc), please call us [phone] to see if we need to switch you to a different pain medicine that will work better for you and/or control your side effect better. 2. If you need a refill on your pain medication, please contact your pharmacy. They will contact our office to request authorization. Prescriptions will not be filled after 5 pm or on week-ends. 4. Avoid getting constipated. When taking pain medications, it is common to experience some constipation. Increasing fluid intake and taking a fiber supplement (such as Metamucil, Citrucel, FiberCon, MiraLax, etc) 1-2 times a day regularly will usually help prevent this problem from occurring. A mild laxative (prune juice, Milk of Magnesia, MiraLax,  etc) should be taken according to package directions if there are no bowel movements after 48 hours.  5. Watch out for diarrhea. If you have many loose bowel movements, simplify your diet to bland foods & liquids for a few days. Stop any stool softeners and decrease your fiber supplement. Switching to mild anti-diarrheal medications (Kayopectate, Pepto Bismol) can help. If this worsens or does not improve, please call us.   WHEN TO CALL us [phone]:  1. Poor pain control 2. Reactions / problems with new medications (rash/itching, nausea, etc)  3. Fever over 101.5 F (38.5 C) 4. Worsening swelling or bruising 5. Dizziness, weakness, cough, any concerning symptoms  The clinic staff is available to answer your questions during regular business hours (8:30am-5pm). Please dont hesitate to call and ask to speak to one of our nurses for clinical concerns.  If you have a medical emergency, go to the nearest emergency room or call 911.  A surgeon from [facility] is always on call at the [facility], [place], [place], [place], [place] [address] ?  MAIN: [phone] ? TOLL FREE: [phone] ?  FAX [phone]  [email or web address]

## 2018-04-15 ENCOUNTER — Ambulatory Visit (INDEPENDENT_AMBULATORY_CARE_PROVIDER_SITE_OTHER): Payer: POS | Admitting: *Deleted

## 2018-04-15 ENCOUNTER — Ambulatory Visit (INDEPENDENT_AMBULATORY_CARE_PROVIDER_SITE_OTHER): Payer: POS | Admitting: Internal Medicine

## 2018-04-15 ENCOUNTER — Encounter: Payer: Self-pay | Admitting: Internal Medicine

## 2018-04-15 VITALS — BP 122/74 | HR 63 | Ht 65.0 in | Wt 142.0 lb

## 2018-04-15 DIAGNOSIS — I4891 Unspecified atrial fibrillation: Secondary | ICD-10-CM

## 2018-04-15 DIAGNOSIS — Z950 Presence of cardiac pacemaker: Secondary | ICD-10-CM | POA: Diagnosis not present

## 2018-04-15 DIAGNOSIS — Z7901 Long term (current) use of anticoagulants: Secondary | ICD-10-CM | POA: Diagnosis not present

## 2018-04-15 DIAGNOSIS — I495 Sick sinus syndrome: Secondary | ICD-10-CM

## 2018-04-15 DIAGNOSIS — I10 Essential (primary) hypertension: Secondary | ICD-10-CM | POA: Diagnosis not present

## 2018-04-15 DIAGNOSIS — Z23 Encounter for immunization: Secondary | ICD-10-CM

## 2018-04-15 DIAGNOSIS — Z5181 Encounter for therapeutic drug level monitoring: Secondary | ICD-10-CM

## 2018-04-15 LAB — POCT INR: INR: 1.6 — AB (ref 2.0–3.0)
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
## 2018-04-15 NOTE — Patient Instructions (Signed)
Description   Today take extra 1/2 tablet (already taken today's dose) then continue taking 2 tablets everyday except 1 tablet on Mondays, Wednesdays, and Fridays.  Recheck in 2 weeks. Coumadin Clinic [phone].

## 2018-04-15 NOTE — Patient Instructions (Signed)
Medication Instructions:  Your physician recommends that you continue on your current medications as directed. Please refer to the Current Medication list given to you today.  Labwork: None ordered.  Testing/Procedures: None ordered.  Follow-Up: Your physician wants you to follow-up in: one year with Dr. Lovena Le.   You will receive a reminder letter in the mail two months in advance. If you don't receive a letter, please call our office to schedule the follow-up appointment.  Remote monitoring is used to monitor your Pacemaker from home. This monitoring reduces the number of office visits required to check your device to one time per year. It allows [place] to keep an eye on the functioning of your device to ensure it is working properly. You are scheduled for a device check from home on 05/17/2018. You may send your transmission at any time that day. If you have a wireless device, the transmission will be sent automatically. After your physician reviews your transmission, you will receive a postcard with your next transmission date.  Any Other Special Instructions Will Be Listed Below (If Applicable).  If you need a refill on your cardiac medications before your next appointment, please call your pharmacy.

## 2018-04-19 ENCOUNTER — Encounter: Payer: Self-pay | Admitting: Physician Assistant

## 2018-04-22 ENCOUNTER — Telehealth: Payer: Self-pay

## 2018-04-22 DIAGNOSIS — I340 Nonrheumatic mitral (valve) insufficiency: Secondary | ICD-10-CM

## 2018-04-22 NOTE — Telephone Encounter (Signed)
Left message notifying Pt will need repeat ECHO in January.  Will order.

## 2018-05-03 ENCOUNTER — Encounter: Payer: POS | Admitting: Physician Assistant

## 2018-05-03 ENCOUNTER — Ambulatory Visit (INDEPENDENT_AMBULATORY_CARE_PROVIDER_SITE_OTHER): Payer: POS | Admitting: *Deleted

## 2018-05-03 VITALS — Ht 65.0 in

## 2018-05-03 DIAGNOSIS — Z5181 Encounter for therapeutic drug level monitoring: Secondary | ICD-10-CM

## 2018-05-03 DIAGNOSIS — I4891 Unspecified atrial fibrillation: Secondary | ICD-10-CM

## 2018-05-03 DIAGNOSIS — Z7901 Long term (current) use of anticoagulants: Secondary | ICD-10-CM

## 2018-05-03 LAB — POCT INR: INR: 3.5 — AB (ref 2.0–3.0)

## 2018-05-03 NOTE — Patient Instructions (Signed)
Description   Hold tomorrow's dose then continue taking 2 tablets everyday except 1 tablet on Mondays, Wednesdays, and Fridays.  Recheck in 2 weeks. [facility] [phone].

## 2018-05-06 ENCOUNTER — Encounter: Payer: Self-pay | Admitting: Internal Medicine

## 2018-05-17 ENCOUNTER — Ambulatory Visit (INDEPENDENT_AMBULATORY_CARE_PROVIDER_SITE_OTHER): Payer: POS | Admitting: *Deleted

## 2018-05-17 DIAGNOSIS — I495 Sick sinus syndrome: Secondary | ICD-10-CM | POA: Diagnosis not present

## 2018-05-17 NOTE — Progress Notes (Signed)
Remote pacemaker transmission.   

## 2018-05-18 ENCOUNTER — Encounter: Payer: Self-pay | Admitting: Cardiology

## 2018-05-19 ENCOUNTER — Ambulatory Visit (INDEPENDENT_AMBULATORY_CARE_PROVIDER_SITE_OTHER): Payer: POS | Admitting: Pharmacist

## 2018-05-19 DIAGNOSIS — I4891 Unspecified atrial fibrillation: Secondary | ICD-10-CM

## 2018-05-19 DIAGNOSIS — Z5181 Encounter for therapeutic drug level monitoring: Secondary | ICD-10-CM

## 2018-05-19 DIAGNOSIS — Z7901 Long term (current) use of anticoagulants: Secondary | ICD-10-CM

## 2018-05-19 LAB — POCT INR: INR: 2.8 (ref 2.0–3.0)

## 2018-05-19 NOTE — Patient Instructions (Signed)
Description   Continue taking 2 tablets every day except 1 tablet on Mondays, Wednesdays, and Fridays.  Recheck in 3 weeks. [facility] [phone].

## 2018-06-10 ENCOUNTER — Ambulatory Visit: Payer: POS | Admitting: *Deleted

## 2018-06-10 DIAGNOSIS — Z7901 Long term (current) use of anticoagulants: Secondary | ICD-10-CM

## 2018-06-10 DIAGNOSIS — Z5181 Encounter for therapeutic drug level monitoring: Secondary | ICD-10-CM

## 2018-06-10 DIAGNOSIS — I4891 Unspecified atrial fibrillation: Secondary | ICD-10-CM | POA: Diagnosis not present

## 2018-06-10 LAB — POCT INR: INR: 2.6 (ref 2.0–3.0)

## 2018-06-10 NOTE — Patient Instructions (Signed)
Description   Continue taking 2 tablets every day except 1 tablet on Mondays, Wednesdays, and Fridays.  Recheck in 4 weeks. [facility] [phone].

## 2018-06-26 ENCOUNTER — Other Ambulatory Visit: Payer: Self-pay | Admitting: Internal Medicine

## 2018-07-08 ENCOUNTER — Ambulatory Visit (INDEPENDENT_AMBULATORY_CARE_PROVIDER_SITE_OTHER): Payer: POS | Admitting: *Deleted

## 2018-07-08 DIAGNOSIS — Z7901 Long term (current) use of anticoagulants: Secondary | ICD-10-CM

## 2018-07-08 DIAGNOSIS — Z5181 Encounter for therapeutic drug level monitoring: Secondary | ICD-10-CM | POA: Diagnosis not present

## 2018-07-08 DIAGNOSIS — I4891 Unspecified atrial fibrillation: Secondary | ICD-10-CM

## 2018-07-08 LAB — POCT INR: INR: 2.7 (ref 2.0–3.0)

## 2018-07-08 NOTE — Patient Instructions (Signed)
Description   Continue taking 2 tablets every day except 1 tablet on Mondays, Wednesdays, and Fridays.  Recheck in 4 weeks. [facility] [phone].

## 2018-07-17 LAB — CUP PACEART REMOTE DEVICE CHECK
Date Time Interrogation Session: 20191222132411
Implantable Lead Implant Date: 20050517
Implantable Lead Location: 753859
Implantable Lead Model: 4086
Implantable Lead Model: 4087
Implantable Lead Serial Number: 237198
Implantable Pulse Generator Implant Date: 20150112
MDC IDC LEAD IMPLANT DT: 20050517
MDC IDC LEAD LOCATION: 753860
MDC IDC LEAD SERIAL: 218470
Pulse Gen Serial Number: 389910

## 2018-08-05 ENCOUNTER — Ambulatory Visit (INDEPENDENT_AMBULATORY_CARE_PROVIDER_SITE_OTHER): Payer: POS | Admitting: *Deleted

## 2018-08-05 DIAGNOSIS — I4891 Unspecified atrial fibrillation: Secondary | ICD-10-CM

## 2018-08-05 DIAGNOSIS — Z7901 Long term (current) use of anticoagulants: Secondary | ICD-10-CM | POA: Diagnosis not present

## 2018-08-05 DIAGNOSIS — Z5181 Encounter for therapeutic drug level monitoring: Secondary | ICD-10-CM

## 2018-08-05 LAB — POCT INR: INR: 1.8 — AB (ref 2.0–3.0)

## 2018-08-05 NOTE — Patient Instructions (Signed)
Description   Today take 1/2 tablet more (already taken today's dose) then continue taking 2 tablets every day except 1 tablet on Mondays, Wednesdays, and Fridays.  Recheck in 4 weeks. [facility] [phone].

## 2018-08-12 ENCOUNTER — Other Ambulatory Visit: Payer: Self-pay

## 2018-08-12 ENCOUNTER — Ambulatory Visit (HOSPITAL_COMMUNITY): Payer: POS | Attending: Cardiovascular Disease

## 2018-08-12 DIAGNOSIS — I340 Nonrheumatic mitral (valve) insufficiency: Secondary | ICD-10-CM | POA: Diagnosis present

## 2018-08-16 ENCOUNTER — Ambulatory Visit (INDEPENDENT_AMBULATORY_CARE_PROVIDER_SITE_OTHER): Payer: POS

## 2018-08-16 DIAGNOSIS — I4891 Unspecified atrial fibrillation: Secondary | ICD-10-CM

## 2018-08-16 DIAGNOSIS — I495 Sick sinus syndrome: Secondary | ICD-10-CM

## 2018-08-17 NOTE — Progress Notes (Signed)
Remote pacemaker transmission.   

## 2018-08-18 LAB — CUP PACEART REMOTE DEVICE CHECK
Battery Remaining Percentage: 100 %
Brady Statistic RA Percent Paced: 90 %
Brady Statistic RV Percent Paced: 5 %
Implantable Lead Implant Date: 20050517
Implantable Lead Location: 753859
Implantable Lead Model: 4087
Implantable Lead Serial Number: 237198
Implantable Pulse Generator Implant Date: 20150112
Lead Channel Impedance Value: 441 Ohm
Lead Channel Pacing Threshold Pulse Width: 0.4 ms
Lead Channel Setting Pacing Amplitude: 2.4 V
Lead Channel Setting Pacing Amplitude: 2.4 V
Lead Channel Setting Pacing Pulse Width: 0.4 ms
Lead Channel Setting Sensing Sensitivity: 2.5 mV
MDC IDC LEAD IMPLANT DT: 20050517
MDC IDC LEAD LOCATION: 753860
MDC IDC LEAD SERIAL: 218470
MDC IDC MSMT BATTERY REMAINING LONGEVITY: 96 mo
MDC IDC MSMT LEADCHNL RA PACING THRESHOLD AMPLITUDE: 1.1 V
MDC IDC MSMT LEADCHNL RV IMPEDANCE VALUE: 769 Ohm
MDC IDC SESS DTM: 20200121053200
Pulse Gen Serial Number: 389910

## 2018-08-19 ENCOUNTER — Encounter: Payer: Self-pay | Admitting: Cardiology

## 2018-09-02 ENCOUNTER — Ambulatory Visit (INDEPENDENT_AMBULATORY_CARE_PROVIDER_SITE_OTHER): Payer: POS | Admitting: *Deleted

## 2018-09-02 DIAGNOSIS — Z5181 Encounter for therapeutic drug level monitoring: Secondary | ICD-10-CM

## 2018-09-02 DIAGNOSIS — Z7901 Long term (current) use of anticoagulants: Secondary | ICD-10-CM

## 2018-09-02 DIAGNOSIS — I4891 Unspecified atrial fibrillation: Secondary | ICD-10-CM | POA: Diagnosis not present

## 2018-09-02 LAB — POCT INR: INR: 1.9 — AB (ref 2.0–3.0)

## 2018-09-02 NOTE — Patient Instructions (Signed)
Description   Today take 1/2 tablet more (already taken today's dose) then start taking 2 tablets every day except 1 tablet on Mondays and Fridays.  Recheck in 3 weeks. [facility] [phone].

## 2018-09-23 ENCOUNTER — Ambulatory Visit (INDEPENDENT_AMBULATORY_CARE_PROVIDER_SITE_OTHER): Payer: POS

## 2018-09-23 ENCOUNTER — Other Ambulatory Visit: Payer: Self-pay | Admitting: Internal Medicine

## 2018-09-23 DIAGNOSIS — Z7901 Long term (current) use of anticoagulants: Secondary | ICD-10-CM | POA: Diagnosis not present

## 2018-09-23 DIAGNOSIS — I4891 Unspecified atrial fibrillation: Secondary | ICD-10-CM | POA: Diagnosis not present

## 2018-09-23 DIAGNOSIS — Z5181 Encounter for therapeutic drug level monitoring: Secondary | ICD-10-CM

## 2018-09-23 LAB — POCT INR: INR: 1.5 — AB (ref 2.0–3.0)

## 2018-09-23 NOTE — Patient Instructions (Addendum)
  Description   Start taking 2 tablets every day except 1 tablet on Mondays.  Recheck in 2 weeks. [facility] [phone].

## 2018-10-07 ENCOUNTER — Other Ambulatory Visit: Payer: Self-pay

## 2018-10-07 ENCOUNTER — Ambulatory Visit (INDEPENDENT_AMBULATORY_CARE_PROVIDER_SITE_OTHER): Payer: POS | Admitting: Pharmacist

## 2018-10-07 ENCOUNTER — Other Ambulatory Visit: Payer: Self-pay | Admitting: Internal Medicine

## 2018-10-07 DIAGNOSIS — I4891 Unspecified atrial fibrillation: Secondary | ICD-10-CM

## 2018-10-07 DIAGNOSIS — Z5181 Encounter for therapeutic drug level monitoring: Secondary | ICD-10-CM | POA: Diagnosis not present

## 2018-10-07 DIAGNOSIS — Z7901 Long term (current) use of anticoagulants: Secondary | ICD-10-CM

## 2018-10-07 LAB — POCT INR: INR: 2.5 (ref 2.0–3.0)

## 2018-10-07 NOTE — Patient Instructions (Signed)
Description   Continue taking 2 tablets every day except 1 tablet on Wednesdays.  Recheck in 3 weeks. [facility] [phone].

## 2018-10-07 NOTE — Telephone Encounter (Signed)
Allergies as of 04/02/2018   No Known Allergies        Medication List    STOP taking these medications   hydrochlorothiazide 25 MG tablet Commonly known as:  HYDRODIURIL   lisinopril 2.5 MG tablet Commonly known as:  PRINIVIL,ZESTRIL   sildenafil 25 MG tablet Commonly known as:  VIAGRA   verapamil 240 MG CR tablet Commonly known as:  CALAN-SR   Requested Prescriptions    Name from pharmacy: [facility] 240MG  TABLETS       Will file in chart as: verapamil (CALAN-SR) 240 MG CR tablet   The source prescription was discontinued on 04/02/2018 by Kalman Drape, PA for the following reason: Stop Taking at Discharge.

## 2018-10-20 ENCOUNTER — Other Ambulatory Visit: Payer: Self-pay | Admitting: Internal Medicine

## 2018-10-27 ENCOUNTER — Telehealth: Payer: Self-pay

## 2018-10-27 NOTE — Telephone Encounter (Signed)

## 2018-11-15 ENCOUNTER — Encounter: Payer: POS | Admitting: *Deleted

## 2018-11-15 ENCOUNTER — Other Ambulatory Visit: Payer: Self-pay

## 2018-11-16 ENCOUNTER — Telehealth: Payer: Self-pay | Admitting: Internal Medicine

## 2018-11-16 ENCOUNTER — Telehealth: Payer: Self-pay

## 2018-11-16 NOTE — Telephone Encounter (Signed)
Left message for patient to remind of missed remote transmission.  

## 2018-11-16 NOTE — Telephone Encounter (Signed)
Pt returned call is having some work done to his home and unplugged the machine.  Pt stated he will send a transmission today.

## 2018-11-17 NOTE — Telephone Encounter (Signed)
Spoke w/ pt and he stated that he can not figure out how to hook monitor back up. Instructed pt to call tech support for further help. Pt verbalized understanding.

## 2018-12-05 LAB — CUP PACEART REMOTE DEVICE CHECK
Battery Remaining Longevity: 96 mo
Battery Remaining Percentage: 100 %
Brady Statistic RA Percent Paced: 87 %
Brady Statistic RV Percent Paced: 4 %
Date Time Interrogation Session: 20200511232900
Implantable Lead Implant Date: 20050517
Implantable Lead Implant Date: 20050517
Implantable Lead Location: 753859
Implantable Lead Location: 753860
Implantable Lead Model: 4086
Implantable Lead Model: 4087
Implantable Lead Serial Number: 218470
Implantable Lead Serial Number: 237198
Implantable Pulse Generator Implant Date: 20150112
Lead Channel Impedance Value: 431 Ohm
Lead Channel Impedance Value: 754 Ohm
Lead Channel Setting Pacing Amplitude: 2.4 V
Lead Channel Setting Pacing Amplitude: 2.4 V
Lead Channel Setting Pacing Pulse Width: 0.4 ms
Lead Channel Setting Sensing Sensitivity: 2.5 mV
Pulse Gen Serial Number: 389910

## 2018-12-07 ENCOUNTER — Other Ambulatory Visit: Payer: Self-pay

## 2018-12-07 ENCOUNTER — Ambulatory Visit (INDEPENDENT_AMBULATORY_CARE_PROVIDER_SITE_OTHER): Payer: POS | Admitting: *Deleted

## 2018-12-07 DIAGNOSIS — I4891 Unspecified atrial fibrillation: Secondary | ICD-10-CM

## 2018-12-07 DIAGNOSIS — I495 Sick sinus syndrome: Secondary | ICD-10-CM

## 2018-12-23 NOTE — Progress Notes (Signed)
Remote pacemaker transmission.   

## 2018-12-27 ENCOUNTER — Telehealth: Payer: Self-pay

## 2018-12-27 ENCOUNTER — Other Ambulatory Visit: Payer: Self-pay | Admitting: Internal Medicine

## 2018-12-27 NOTE — Telephone Encounter (Signed)
Overdue for follow-up, last seen 10/07/18, missed appt on 10/28/18. Will call to reschedule appt.

## 2018-12-27 NOTE — Telephone Encounter (Signed)
Attempted to contact pt, LMOM TCB for appt.

## 2018-12-27 NOTE — Telephone Encounter (Signed)

## 2018-12-30 ENCOUNTER — Other Ambulatory Visit: Payer: Self-pay

## 2018-12-30 ENCOUNTER — Ambulatory Visit (INDEPENDENT_AMBULATORY_CARE_PROVIDER_SITE_OTHER): Payer: POS | Admitting: *Deleted

## 2018-12-30 DIAGNOSIS — Z7901 Long term (current) use of anticoagulants: Secondary | ICD-10-CM | POA: Diagnosis not present

## 2018-12-30 DIAGNOSIS — Z5181 Encounter for therapeutic drug level monitoring: Secondary | ICD-10-CM

## 2018-12-30 DIAGNOSIS — I4891 Unspecified atrial fibrillation: Secondary | ICD-10-CM | POA: Diagnosis not present

## 2018-12-30 LAB — POCT INR: INR: 2.1 (ref 2.0–3.0)

## 2018-12-30 NOTE — Patient Instructions (Signed)
Description   Continue taking 2 tablets every day except 1 tablet on Wednesdays.  Recheck in 6 weeks. [facility] [phone].

## 2019-02-09 ENCOUNTER — Telehealth: Payer: Self-pay

## 2019-02-09 NOTE — Telephone Encounter (Signed)
lmom for prescreen  

## 2019-02-13 ENCOUNTER — Other Ambulatory Visit: Payer: Self-pay

## 2019-02-13 ENCOUNTER — Ambulatory Visit (INDEPENDENT_AMBULATORY_CARE_PROVIDER_SITE_OTHER): Payer: POS | Admitting: *Deleted

## 2019-02-13 ENCOUNTER — Encounter (INDEPENDENT_AMBULATORY_CARE_PROVIDER_SITE_OTHER): Payer: Self-pay

## 2019-02-13 DIAGNOSIS — Z5181 Encounter for therapeutic drug level monitoring: Secondary | ICD-10-CM

## 2019-02-13 DIAGNOSIS — I4891 Unspecified atrial fibrillation: Secondary | ICD-10-CM | POA: Diagnosis not present

## 2019-02-13 DIAGNOSIS — Z7901 Long term (current) use of anticoagulants: Secondary | ICD-10-CM | POA: Diagnosis not present

## 2019-02-13 LAB — POCT INR: INR: 2.8 (ref 2.0–3.0)

## 2019-02-13 NOTE — Patient Instructions (Signed)
Description   Continue taking 2 tablets every day except 1 tablet on Wednesdays.  Recheck in 6 weeks. [facility] [phone].

## 2019-02-25 ENCOUNTER — Other Ambulatory Visit: Payer: Self-pay | Admitting: Internal Medicine

## 2019-03-08 ENCOUNTER — Ambulatory Visit (INDEPENDENT_AMBULATORY_CARE_PROVIDER_SITE_OTHER): Payer: POS | Admitting: *Deleted

## 2019-03-08 DIAGNOSIS — I495 Sick sinus syndrome: Secondary | ICD-10-CM | POA: Diagnosis not present

## 2019-03-08 LAB — CUP PACEART REMOTE DEVICE CHECK
Battery Remaining Longevity: 90 mo
Battery Remaining Percentage: 96 %
Brady Statistic RA Percent Paced: 88 %
Brady Statistic RV Percent Paced: 4 %
Date Time Interrogation Session: 20200810043200
Implantable Lead Implant Date: 20050517
Implantable Lead Implant Date: 20050517
Implantable Lead Location: 753859
Implantable Lead Location: 753860
Implantable Lead Model: 4086
Implantable Lead Model: 4087
Implantable Lead Serial Number: 218470
Implantable Lead Serial Number: 237198
Implantable Pulse Generator Implant Date: 20150112
Lead Channel Impedance Value: 411 Ohm
Lead Channel Impedance Value: 716 Ohm
Lead Channel Pacing Threshold Amplitude: 1.1 V
Lead Channel Pacing Threshold Pulse Width: 0.4 ms
Lead Channel Setting Pacing Amplitude: 2.4 V
Lead Channel Setting Pacing Amplitude: 2.4 V
Lead Channel Setting Pacing Pulse Width: 0.4 ms
Lead Channel Setting Sensing Sensitivity: 2.5 mV
Pulse Gen Serial Number: 389910

## 2019-03-16 ENCOUNTER — Encounter: Payer: Self-pay | Admitting: Cardiology

## 2019-03-16 NOTE — Progress Notes (Signed)
Remote pacemaker transmission.   

## 2019-03-27 ENCOUNTER — Other Ambulatory Visit: Payer: Self-pay | Admitting: Internal Medicine

## 2019-03-27 ENCOUNTER — Other Ambulatory Visit: Payer: Self-pay

## 2019-03-27 ENCOUNTER — Ambulatory Visit (INDEPENDENT_AMBULATORY_CARE_PROVIDER_SITE_OTHER): Payer: POS | Admitting: *Deleted

## 2019-03-27 DIAGNOSIS — I4891 Unspecified atrial fibrillation: Secondary | ICD-10-CM | POA: Diagnosis not present

## 2019-03-27 DIAGNOSIS — Z5181 Encounter for therapeutic drug level monitoring: Secondary | ICD-10-CM | POA: Diagnosis not present

## 2019-03-27 DIAGNOSIS — Z7901 Long term (current) use of anticoagulants: Secondary | ICD-10-CM

## 2019-03-27 LAB — POCT INR: INR: 3.1 — AB (ref 2.0–3.0)

## 2019-03-27 NOTE — Patient Instructions (Addendum)
Description    Take 1 tablet tomorrow, Then continue taking 2 tablets every day except 1 tablet on Wednesdays.  Recheck in 6 weeks. [facility]in Clinic [phone].  Main # [phone]

## 2019-05-08 ENCOUNTER — Ambulatory Visit (INDEPENDENT_AMBULATORY_CARE_PROVIDER_SITE_OTHER): Payer: POS

## 2019-05-08 ENCOUNTER — Other Ambulatory Visit: Payer: Self-pay

## 2019-05-08 DIAGNOSIS — I4891 Unspecified atrial fibrillation: Secondary | ICD-10-CM

## 2019-05-08 DIAGNOSIS — Z5181 Encounter for therapeutic drug level monitoring: Secondary | ICD-10-CM

## 2019-05-08 DIAGNOSIS — Z7901 Long term (current) use of anticoagulants: Secondary | ICD-10-CM | POA: Diagnosis not present

## 2019-05-08 LAB — POCT INR: INR: 2.6 (ref 2.0–3.0)

## 2019-05-08 NOTE — Patient Instructions (Signed)
Description   Continue on same dosage 2 tablets every day except 1 tablet on Wednesdays.  Recheck in 6 weeks. [facility] [phone].  Main # [phone]

## 2019-05-12 ENCOUNTER — Other Ambulatory Visit: Payer: Self-pay | Admitting: Internal Medicine

## 2019-05-26 ENCOUNTER — Other Ambulatory Visit: Payer: Self-pay | Admitting: Internal Medicine

## 2019-06-07 ENCOUNTER — Ambulatory Visit (INDEPENDENT_AMBULATORY_CARE_PROVIDER_SITE_OTHER): Payer: POS | Admitting: *Deleted

## 2019-06-07 DIAGNOSIS — I4891 Unspecified atrial fibrillation: Secondary | ICD-10-CM | POA: Diagnosis not present

## 2019-06-07 LAB — CUP PACEART REMOTE DEVICE CHECK
Battery Remaining Longevity: 90 mo
Battery Remaining Percentage: 93 %
Brady Statistic RA Percent Paced: 85 %
Brady Statistic RV Percent Paced: 4 %
Date Time Interrogation Session: 20201111043323
Implantable Lead Implant Date: 20050517
Implantable Lead Implant Date: 20050517
Implantable Lead Location: 753859
Implantable Lead Location: 753860
Implantable Lead Model: 4086
Implantable Lead Model: 4087
Implantable Lead Serial Number: 218470
Implantable Lead Serial Number: 237198
Implantable Pulse Generator Implant Date: 20150112
Lead Channel Impedance Value: 439 Ohm
Lead Channel Impedance Value: 782 Ohm
Lead Channel Pacing Threshold Amplitude: 1.1 V
Lead Channel Pacing Threshold Pulse Width: 0.4 ms
Lead Channel Setting Pacing Amplitude: 2.4 V
Lead Channel Setting Pacing Amplitude: 2.4 V
Lead Channel Setting Pacing Pulse Width: 0.4 ms
Lead Channel Setting Sensing Sensitivity: 2.5 mV
Pulse Gen Serial Number: 389910

## 2019-06-21 ENCOUNTER — Ambulatory Visit (INDEPENDENT_AMBULATORY_CARE_PROVIDER_SITE_OTHER): Payer: POS

## 2019-06-21 ENCOUNTER — Other Ambulatory Visit: Payer: Self-pay | Admitting: Internal Medicine

## 2019-06-21 ENCOUNTER — Encounter (INDEPENDENT_AMBULATORY_CARE_PROVIDER_SITE_OTHER): Payer: Self-pay

## 2019-06-21 ENCOUNTER — Other Ambulatory Visit: Payer: Self-pay

## 2019-06-21 ENCOUNTER — Ambulatory Visit (INDEPENDENT_AMBULATORY_CARE_PROVIDER_SITE_OTHER): Payer: POS | Admitting: Internal Medicine

## 2019-06-21 ENCOUNTER — Encounter: Payer: Self-pay | Admitting: Internal Medicine

## 2019-06-21 VITALS — BP 112/68 | HR 71 | Ht 65.0 in | Wt 149.4 lb

## 2019-06-21 DIAGNOSIS — I495 Sick sinus syndrome: Secondary | ICD-10-CM

## 2019-06-21 DIAGNOSIS — Z950 Presence of cardiac pacemaker: Secondary | ICD-10-CM | POA: Diagnosis not present

## 2019-06-21 DIAGNOSIS — I4891 Unspecified atrial fibrillation: Secondary | ICD-10-CM

## 2019-06-21 DIAGNOSIS — Z5181 Encounter for therapeutic drug level monitoring: Secondary | ICD-10-CM | POA: Diagnosis not present

## 2019-06-21 DIAGNOSIS — Z7901 Long term (current) use of anticoagulants: Secondary | ICD-10-CM | POA: Diagnosis not present

## 2019-06-21 DIAGNOSIS — I4819 Other persistent atrial fibrillation: Secondary | ICD-10-CM | POA: Diagnosis not present

## 2019-06-21 LAB — CUP PACEART INCLINIC DEVICE CHECK
Brady Statistic RA Percent Paced: 85 %
Brady Statistic RV Percent Paced: 4 %
Date Time Interrogation Session: 20201125134533
Implantable Lead Implant Date: 20050517
Implantable Lead Implant Date: 20050517
Implantable Lead Location: 753859
Implantable Lead Location: 753860
Implantable Lead Model: 4086
Implantable Lead Model: 4087
Implantable Lead Serial Number: 218470
Implantable Lead Serial Number: 237198
Implantable Pulse Generator Implant Date: 20150112
Lead Channel Impedance Value: 436 Ohm
Lead Channel Impedance Value: 760 Ohm
Lead Channel Pacing Threshold Amplitude: 0.7 V
Lead Channel Pacing Threshold Amplitude: 1.1 V
Lead Channel Pacing Threshold Pulse Width: 0.4 ms
Lead Channel Pacing Threshold Pulse Width: 0.4 ms
Lead Channel Sensing Intrinsic Amplitude: 0.6 mV
Lead Channel Sensing Intrinsic Amplitude: 8 mV
Lead Channel Setting Pacing Amplitude: 2.4 V
Lead Channel Setting Pacing Amplitude: 2.4 V
Lead Channel Setting Pacing Pulse Width: 0.4 ms
Lead Channel Setting Sensing Sensitivity: 2.5 mV
Pulse Gen Serial Number: 389910

## 2019-06-21 LAB — POCT INR: INR: 3 (ref 2.0–3.0)

## 2019-06-21 MED ORDER — DIGOXIN 125 MCG PO TABS: 125.0000 ug | ORAL_TABLET | Freq: Every day | ORAL | 3 refills | Status: DC

## 2019-06-21 NOTE — Patient Instructions (Signed)
Description   Continue on same dosage 2 tablets every day except 1 tablet on Wednesdays.  Recheck in 6 weeks. [facility] [phone].  Main # [phone]

## 2019-06-21 NOTE — Patient Instructions (Signed)
Medication Instructions:  Your physician recommends that you continue on your current medications as directed. Please refer to the Current Medication list given to you today.  Labwork: None ordered.  Testing/Procedures: None ordered.  Follow-Up: Your physician wants you to follow-up in: one year with Dr. Lovena Le.   You will receive a reminder letter in the mail two months in advance. If you don't receive a letter, please call our office to schedule the follow-up appointment.  Remote monitoring is used to monitor your Pacemaker from home. This monitoring reduces the number of office visits required to check your device to one time per year. It allows [place] to keep an eye on the functioning of your device to ensure it is working properly. You are scheduled for a device check from home on 09/06/2019. You may send your transmission at any time that day. If you have a wireless device, the transmission will be sent automatically. After your physician reviews your transmission, you will receive a postcard with your next transmission date.  Any Other Special Instructions Will Be Listed Below (If Applicable).  If you need a refill on your cardiac medications before your next appointment, please call your pharmacy.

## 2019-06-21 NOTE — Progress Notes (Signed)
HPI Mr. Sampley returns today for followup. The patient is a pleasant 73 yo man with PAF, HTN, and CAD. He has sinus node dysfunction and is s/p PPM insertion. The patient has felt well in the interim with no chest pain or sob. No syncope. He has been exercising most days of the week including yoga. He denies palpitations. No Known Allergies   Current Outpatient Medications  Medication Sig Dispense Refill  . acetaminophen (TYLENOL) 325 MG tablet Take 2 tablets (650 mg total) by mouth every 6 (six) hours as needed for fever.    Marland Kitchen atorvastatin (LIPITOR) 80 MG tablet Take 40 mg by mouth at bedtime.     . Calcium Carbonate-Vitamin D (CALCIUM 500/D) 500-125 MG-UNIT TABS Take 1 tablet by mouth 2 (two) times daily.     . digoxin (LANOXIN) 0.125 MG tablet Take 1 tablet (125 mcg total) by mouth daily. 90 tablet 3  . hydrochlorothiazide (MICROZIDE) 12.5 MG capsule Take 12.5 mg by mouth daily.    Marland Kitchen LISINOPRIL PO Take by mouth.    . prednisoLONE acetate (PREDNISOLONE ACETATE P-F) 1 % ophthalmic suspension Place 1 drop into both eyes every 7 (seven) days.     Marland Kitchen QUEtiapine (SEROQUEL) 25 MG tablet Take 12.5 mg by mouth at bedtime.     . sertraline (ZOLOFT) 100 MG tablet Take 200 mg by mouth daily.     Marland Kitchen thiamine 100 MG tablet Take 100 mg by mouth daily.    . vitamin B-12 (CYANOCOBALAMIN) 500 MCG tablet Take 500 mcg by mouth 2 (two) times daily.    Marland Kitchen warfarin (COUMADIN) 2 MG tablet TAKE AS DIRECTED BY PRESCRIBER 60 tablet 2   No current facility-administered medications for this visit.      Past Medical History:  Diagnosis Date  . Abdominal hernia   . Anxiety   . Arthritis   . Atrial fibrillation ([place])   . Atrial flutter ([place])   . Blood transfusion without reported diagnosis   . Cataract    bilateral cataracts removed  . Clotting disorder ([facility])    coumadin - a fib  . Coronary artery disease   . Depression   . Hyperlipidemia   . Hypertension   . Pacemaker    [employer]  .  Sinus bradycardia   . Sleep apnea    wears a c-pap    ROS:   All systems reviewed and negative except as noted in the HPI.   Past Surgical History:  Procedure Laterality Date  . CATARACT EXTRACTION    . COLONOSCOPY    . hernia repair    . PERMANENT PACEMAKER GENERATOR CHANGE N/A 08/07/2013   Procedure: PERMANENT PACEMAKER GENERATOR CHANGE;  Surgeon: Evans Lance, MD;  Location: [facility] CATH LAB;  Service: Cardiovascular;  Laterality: N/A;  . PERMANENT PACEMAKER INSERTION  2005; 08/07/2013   BSX dual chamber pacemaker implanted 2005 for symptomatic bradycardia; gen change 2015 by Dr Lovena Le  . REPLACEMENT TOTAL KNEE     right knee  . thumb surgery     left and right  . TONSILLECTOMY       Family History  Problem Relation Age of Onset  . Cancer Father        lung cancer  . Stomach cancer Mother   . Stomach cancer Sister   . Colon cancer Neg Hx   . Esophageal cancer Neg Hx   . Pancreatic cancer Neg Hx   . Prostate cancer Neg Hx   . Rectal  cancer Neg Hx      Social History   Socioeconomic History  . Marital status: Single    Spouse name: Not on file  . Number of children: Not on file  . Years of education: Not on file  . Highest education level: Not on file  Occupational History  . Not on file  Social Needs  . Financial resource strain: Not hard at all  . Food insecurity    Worry: Never true    Inability: Never true  . Transportation needs    Medical: No    Non-medical: No  Tobacco Use  . Smoking status: Current Some Day Smoker    Types: E-cigarettes  . Smokeless tobacco: Never Used  . Tobacco comment: Marijuana  Substance and Sexual Activity  . Alcohol use: Yes    Comment: 4-5 beers weekly  . Drug use: Yes    Types: Marijuana    Comment: smoked marijuana 09-26-16  . Sexual activity: Yes    Partners: Female    Birth control/protection: None  Lifestyle  . Physical activity    Days per week: 0 days    Minutes per session: 0 min  . Stress: To some extent   Relationships  . Social connections    Talks on phone: More than three times a week    Gets together: Once a week    Attends religious service: More than 4 times per year    Active member of club or organization: Yes    Attends meetings of clubs or organizations: 1 to 4 times per year    Relationship status: Never married  . Intimate partner violence    Fear of current or ex partner: No    Emotionally abused: No    Physically abused: No    Forced sexual activity: No  Other Topics Concern  . Not on file  Social History Narrative   Pt reports having a girlfriend but plans to break up with her     BP 112/68   Pulse 71   Ht 5\' 5"  (1.651 m)   Wt 149 lb 6.4 oz (67.8 kg)   SpO2 93%   BMI 24.86 kg/m   Physical Exam:  Well appearing NAD HEENT: Unremarkable Neck:  No JVD, no thyromegally Lymphatics:  No adenopathy Back:  No CVA tenderness Lungs:  Clear HEART:  Regular rate rhythm, no murmurs, no rubs, no clicks Abd:  soft, positive bowel sounds, no organomegally, no rebound, no guarding Ext:  2 plus pulses, no edema, no cyanosis, no clubbing Skin:  No rashes no nodules Neuro:  CN II through XII intact, motor grossly intact  EKG - atypical atrial flutter  DEVICE  Normal device function.  See PaceArt for details.   Assess/Plan: 1. Atrial fib/flutter - his VR is mostly well controlled. He will continue his current meds. 2. Coags - we discussed continuing Warfarin vs switching to an [place]. For now he would like to continue his warfarin.  3. PPM - his [employer] DDD PM is working normally.  4. HTN - his BP is well controlled. I encouraged him to continue his current meds, avoid salty foods and continue to exercise.  Mikle Bosworth.D.

## 2019-06-28 NOTE — Progress Notes (Signed)
Remote pacemaker transmission.   

## 2019-07-21 ENCOUNTER — Other Ambulatory Visit: Payer: Self-pay | Admitting: Internal Medicine

## 2019-08-02 ENCOUNTER — Other Ambulatory Visit: Payer: Self-pay

## 2019-08-02 ENCOUNTER — Ambulatory Visit (INDEPENDENT_AMBULATORY_CARE_PROVIDER_SITE_OTHER): Payer: POS | Admitting: *Deleted

## 2019-08-02 DIAGNOSIS — I4891 Unspecified atrial fibrillation: Secondary | ICD-10-CM | POA: Diagnosis not present

## 2019-08-02 DIAGNOSIS — Z7901 Long term (current) use of anticoagulants: Secondary | ICD-10-CM

## 2019-08-02 DIAGNOSIS — Z5181 Encounter for therapeutic drug level monitoring: Secondary | ICD-10-CM | POA: Diagnosis not present

## 2019-08-02 LAB — POCT INR: INR: 2.2 (ref 2.0–3.0)

## 2019-08-02 NOTE — Patient Instructions (Signed)
Description   Continue on same dosage 2 tablets every day except 1 tablet on Wednesdays.  Recheck in 6 weeks. [facility]madin Clinic [phone].  Main # [phone]

## 2019-08-20 ENCOUNTER — Other Ambulatory Visit: Payer: Self-pay | Admitting: Internal Medicine

## 2019-09-06 ENCOUNTER — Ambulatory Visit (INDEPENDENT_AMBULATORY_CARE_PROVIDER_SITE_OTHER): Payer: POS | Admitting: *Deleted

## 2019-09-06 DIAGNOSIS — I4891 Unspecified atrial fibrillation: Secondary | ICD-10-CM

## 2019-09-06 LAB — CUP PACEART REMOTE DEVICE CHECK
Battery Remaining Longevity: 84 mo
Battery Remaining Percentage: 92 %
Brady Statistic RA Percent Paced: 81 %
Brady Statistic RV Percent Paced: 2 %
Date Time Interrogation Session: 20210210003200
Implantable Lead Implant Date: 20050517
Implantable Lead Implant Date: 20050517
Implantable Lead Location: 753859
Implantable Lead Location: 753860
Implantable Lead Model: 4086
Implantable Lead Model: 4087
Implantable Lead Serial Number: 218470
Implantable Lead Serial Number: 237198
Implantable Pulse Generator Implant Date: 20150112
Lead Channel Impedance Value: 431 Ohm
Lead Channel Impedance Value: 739 Ohm
Lead Channel Pacing Threshold Amplitude: 1.1 V
Lead Channel Pacing Threshold Pulse Width: 0.4 ms
Lead Channel Setting Pacing Amplitude: 2.4 V
Lead Channel Setting Pacing Amplitude: 2.4 V
Lead Channel Setting Pacing Pulse Width: 0.4 ms
Lead Channel Setting Sensing Sensitivity: 2.5 mV
Pulse Gen Serial Number: 389910

## 2019-09-06 NOTE — Progress Notes (Signed)
PPM Remote  

## 2019-09-13 ENCOUNTER — Other Ambulatory Visit: Payer: Self-pay

## 2019-09-13 ENCOUNTER — Ambulatory Visit (INDEPENDENT_AMBULATORY_CARE_PROVIDER_SITE_OTHER): Payer: POS | Admitting: *Deleted

## 2019-09-13 DIAGNOSIS — Z5181 Encounter for therapeutic drug level monitoring: Secondary | ICD-10-CM | POA: Diagnosis not present

## 2019-09-13 DIAGNOSIS — Z7901 Long term (current) use of anticoagulants: Secondary | ICD-10-CM

## 2019-09-13 DIAGNOSIS — I4891 Unspecified atrial fibrillation: Secondary | ICD-10-CM | POA: Diagnosis not present

## 2019-09-13 LAB — POCT INR: INR: 2.2 (ref 2.0–3.0)

## 2019-09-13 NOTE — Patient Instructions (Addendum)
Description   Continue on same dosage 2 tablets every day except 1 tablet on Wednesdays.  Recheck in 7 weeks. [facility]in Clinic [phone].  Main # [phone]

## 2019-09-21 ENCOUNTER — Ambulatory Visit: Payer: POS | Attending: Internal Medicine

## 2019-09-21 DIAGNOSIS — Z23 Encounter for immunization: Secondary | ICD-10-CM

## 2019-09-21 NOTE — Progress Notes (Signed)
   Covid-19 Vaccination Clinic  Name:  Phillip Kline    MRN: [id number] DOB: 08-Jul-1946  09/21/2019  Phillip Kline was observed post Covid-19 immunization for 15 minutes without incidence. He was provided with Vaccine Information Sheet and instruction to access the V-Safe system.   Phillip Kline was instructed to call 911 with any severe reactions post vaccine: Marland Kitchen Difficulty breathing  . Swelling of your face and throat  . A fast heartbeat  . A bad rash all over your body  . Dizziness and weakness    Immunizations Administered    Name Date Dose VIS Date Route   Pfizer COVID-19 Vaccine 09/21/2019  1:21 PM 0.3 mL 07/07/2019 Intramuscular   Manufacturer: [place]   Lot: [id number]   [place]: [id number]

## 2019-10-17 ENCOUNTER — Ambulatory Visit: Payer: POS | Attending: Internal Medicine

## 2019-10-17 DIAGNOSIS — Z23 Encounter for immunization: Secondary | ICD-10-CM

## 2019-10-17 NOTE — Progress Notes (Signed)
   Covid-19 Vaccination Clinic  Name:  Phillip Kline    MRN: [id number] DOB: [birth year]  10/17/2019  Mr. Dusing was observed post Covid-19 immunization for 15 minutes without incident. He was provided with Vaccine Information Sheet and instruction to access the V-Safe system.   Mr. Beaston was instructed to call 911 with any severe reactions post vaccine: Marland Kitchen Difficulty breathing  . Swelling of face and throat  . A fast heartbeat  . A bad rash all over body  . Dizziness and weakness   Immunizations Administered    Name Date Dose VIS Date Route   Pfizer COVID-19 Vaccine 10/17/2019  9:06 AM 0.3 mL 07/07/2019 Intramuscular   Manufacturer: [place]   Lot: [id number]   [place]: [id number]

## 2019-11-01 ENCOUNTER — Other Ambulatory Visit: Payer: Self-pay

## 2019-11-01 ENCOUNTER — Ambulatory Visit (INDEPENDENT_AMBULATORY_CARE_PROVIDER_SITE_OTHER): Payer: POS

## 2019-11-01 DIAGNOSIS — I4891 Unspecified atrial fibrillation: Secondary | ICD-10-CM

## 2019-11-01 DIAGNOSIS — Z5181 Encounter for therapeutic drug level monitoring: Secondary | ICD-10-CM | POA: Diagnosis not present

## 2019-11-01 DIAGNOSIS — Z7901 Long term (current) use of anticoagulants: Secondary | ICD-10-CM

## 2019-11-01 LAB — POCT INR: INR: 2 (ref 2.0–3.0)

## 2019-11-01 NOTE — Patient Instructions (Signed)
Description   Take 1 extra tablet today, then resume same dosage 2 tablets every day except 1 tablet on Wednesdays.  Recheck in 7 weeks. Coumadin Clinic [phone].  Main # [phone]

## 2019-11-23 ENCOUNTER — Other Ambulatory Visit: Payer: Self-pay | Admitting: Internal Medicine
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
## 2019-12-06 ENCOUNTER — Telehealth: Payer: Self-pay

## 2019-12-06 NOTE — Telephone Encounter (Signed)
Left message for patient to remind of missed remote transmission.  

## 2019-12-20 ENCOUNTER — Other Ambulatory Visit: Payer: Self-pay

## 2019-12-20 ENCOUNTER — Ambulatory Visit (INDEPENDENT_AMBULATORY_CARE_PROVIDER_SITE_OTHER): Payer: POS | Admitting: *Deleted

## 2019-12-20 DIAGNOSIS — I4891 Unspecified atrial fibrillation: Secondary | ICD-10-CM

## 2019-12-20 DIAGNOSIS — Z5181 Encounter for therapeutic drug level monitoring: Secondary | ICD-10-CM | POA: Diagnosis not present

## 2019-12-20 DIAGNOSIS — Z7901 Long term (current) use of anticoagulants: Secondary | ICD-10-CM | POA: Diagnosis not present

## 2019-12-20 LAB — POCT INR: INR: 2.7 (ref 2.0–3.0)

## 2019-12-20 NOTE — Patient Instructions (Signed)
Description   Continue taking 2 tablets every day except 1 tablet on Wednesdays.  Recheck in 8 weeks. Coumadin Clinic [phone].  Main # [phone]

## 2020-02-14 ENCOUNTER — Ambulatory Visit (INDEPENDENT_AMBULATORY_CARE_PROVIDER_SITE_OTHER): Payer: POS | Admitting: *Deleted

## 2020-02-14 ENCOUNTER — Other Ambulatory Visit: Payer: Self-pay

## 2020-02-14 DIAGNOSIS — Z5181 Encounter for therapeutic drug level monitoring: Secondary | ICD-10-CM

## 2020-02-14 DIAGNOSIS — I4891 Unspecified atrial fibrillation: Secondary | ICD-10-CM | POA: Diagnosis not present

## 2020-02-14 DIAGNOSIS — Z7901 Long term (current) use of anticoagulants: Secondary | ICD-10-CM

## 2020-02-14 LAB — POCT INR: INR: 2.4 (ref 2.0–3.0)

## 2020-02-14 NOTE — Patient Instructions (Signed)
Description   Continue taking Warfarin 2 tablets every day except 1 tablet on Wednesdays.  Recheck in 8 weeks. Coumadin Clinic [phone].  Main # [phone]      

## 2020-02-16 ENCOUNTER — Other Ambulatory Visit: Payer: Self-pay | Admitting: Internal Medicine

## 2020-03-06 ENCOUNTER — Ambulatory Visit: Payer: POS

## 2020-03-10 LAB — CUP PACEART REMOTE DEVICE CHECK
Battery Remaining Longevity: 54 mo
Battery Remaining Percentage: 58 %
Brady Statistic RA Percent Paced: 16 %
Brady Statistic RV Percent Paced: 2 %
Date Time Interrogation Session: 20210814173500
Implantable Lead Implant Date: 20050517
Implantable Lead Implant Date: 20050517
Implantable Lead Location: 753859
Implantable Lead Location: 753860
Implantable Lead Model: 4086
Implantable Lead Model: 4087
Implantable Lead Serial Number: 218470
Implantable Lead Serial Number: 237198
Implantable Pulse Generator Implant Date: 20150112
Lead Channel Impedance Value: 411 Ohm
Lead Channel Impedance Value: 722 Ohm
Lead Channel Pacing Threshold Amplitude: 1.1 V
Lead Channel Pacing Threshold Pulse Width: 0.4 ms
Lead Channel Setting Pacing Amplitude: 2.4 V
Lead Channel Setting Pacing Amplitude: 2.4 V
Lead Channel Setting Pacing Pulse Width: 0.4 ms
Lead Channel Setting Sensing Sensitivity: 2.5 mV
Pulse Gen Serial Number: 389910

## 2020-03-11 ENCOUNTER — Telehealth: Payer: Self-pay | Admitting: Emergency Medicine

## 2020-03-11 NOTE — Telephone Encounter (Signed)
Patient reports he has been asymptomatic. He reports feeling tired but attributes decreased energy level to vacation. He returned home Saturday after 2 week vacation at the beach. He did not take his monitor to the beach. Alert received was for AF with RVR 100-120s on 03/09/20 at 1735 pm. No missed doses of digoxin reported. Patient call the office when he returns home today for assistance with sending remote transmission to assess current rhythm.

## 2020-03-12 NOTE — Telephone Encounter (Signed)
Transmission has not been received.  Left message for pt requesting he send transmission or call office if having trouble with monitor.

## 2020-03-22 ENCOUNTER — Telehealth: Payer: Self-pay | Admitting: Pharmacist

## 2020-03-22 NOTE — Telephone Encounter (Signed)
Patient called to let us know that he tested positive for COVID. States he is asymptomatic. He had his vaccine. He went to get dental work at [place]. Was tested and came back positive. Patient in quarantine. He has not been to our office for a few weeks and is not due to come back until 9/15

## 2020-03-25 NOTE — Telephone Encounter (Signed)
  Per previous note, patient has tested positive for Covid. Is vaccinated.

## 2020-03-26 NOTE — Telephone Encounter (Signed)
Per Dr. Taylor-no changes. 

## 2020-03-27 NOTE — Telephone Encounter (Signed)
Pt returning Amy phone call. He did not remember talking with Leigh. He going to send a transmission.

## 2020-03-30 ENCOUNTER — Other Ambulatory Visit: Payer: Self-pay

## 2020-03-30 ENCOUNTER — Emergency Department (HOSPITAL_COMMUNITY): Payer: POS

## 2020-03-30 ENCOUNTER — Encounter (HOSPITAL_COMMUNITY): Payer: Self-pay | Admitting: Emergency Medicine

## 2020-03-30 ENCOUNTER — Emergency Department (HOSPITAL_COMMUNITY)
Admission: EM | Admit: 2020-03-30 | Discharge: 2020-03-31 | Disposition: A | Discharge status: Home / Self Care | Payer: POS | Attending: Emergency Medicine | Admitting: Emergency Medicine

## 2020-03-30 DIAGNOSIS — Y929 Unspecified place or not applicable: Secondary | ICD-10-CM | POA: Diagnosis not present

## 2020-03-30 DIAGNOSIS — Y999 Unspecified external cause status: Secondary | ICD-10-CM | POA: Diagnosis not present

## 2020-03-30 DIAGNOSIS — S0990XA Unspecified injury of head, initial encounter: Secondary | ICD-10-CM | POA: Diagnosis present

## 2020-03-30 DIAGNOSIS — I2510 Atherosclerotic heart disease of native coronary artery without angina pectoris: Secondary | ICD-10-CM | POA: Insufficient documentation

## 2020-03-30 DIAGNOSIS — W19XXXA Unspecified fall, initial encounter: Secondary | ICD-10-CM | POA: Diagnosis not present

## 2020-03-30 DIAGNOSIS — S0101XA Laceration without foreign body of scalp, initial encounter: Secondary | ICD-10-CM | POA: Insufficient documentation

## 2020-03-30 DIAGNOSIS — R791 Abnormal coagulation profile: Secondary | ICD-10-CM

## 2020-03-30 DIAGNOSIS — I10 Essential (primary) hypertension: Secondary | ICD-10-CM | POA: Diagnosis not present

## 2020-03-30 DIAGNOSIS — Z23 Encounter for immunization: Secondary | ICD-10-CM | POA: Insufficient documentation

## 2020-03-30 DIAGNOSIS — Y906 Blood alcohol level of 120-199 mg/100 ml: Secondary | ICD-10-CM | POA: Diagnosis not present

## 2020-03-30 DIAGNOSIS — Y939 Activity, unspecified: Secondary | ICD-10-CM | POA: Diagnosis not present

## 2020-03-30 HISTORY — DX: Unspecified atrial fibrillation: I48.91

## 2020-03-30 LAB — I-STAT CHEM 8, ED
BUN: 16 mg/dL (ref 8–23)
Calcium, Ion: 0.96 mmol/L — ABNORMAL LOW (ref 1.15–1.40)
Chloride: 100 mmol/L (ref 98–111)
Creatinine, Ser: 1.3 mg/dL — ABNORMAL HIGH (ref 0.61–1.24)
Glucose, Bld: 83 mg/dL (ref 70–99)
HCT: 42 % (ref 39.0–52.0)
Hemoglobin: 14.3 g/dL (ref 13.0–17.0)
Potassium: 3.8 mmol/L (ref 3.5–5.1)
Sodium: 138 mmol/L (ref 135–145)
TCO2: 27 mmol/L (ref 22–32)

## 2020-03-30 LAB — CBC WITH DIFFERENTIAL/PLATELET
Abs Immature Granulocytes: 0.07 10*3/uL (ref 0.00–0.07)
Basophils Absolute: 0.1 10*3/uL (ref 0.0–0.1)
Basophils Relative: 1 %
Eosinophils Absolute: 0.2 10*3/uL (ref 0.0–0.5)
Eosinophils Relative: 3 %
HCT: 42.9 % (ref 39.0–52.0)
Hemoglobin: 13.7 g/dL (ref 13.0–17.0)
Immature Granulocytes: 1 %
Lymphocytes Relative: 31 %
Lymphs Abs: 2.3 10*3/uL (ref 0.7–4.0)
MCH: 32.9 pg (ref 26.0–34.0)
MCHC: 31.9 g/dL (ref 30.0–36.0)
MCV: 103.1 fL — ABNORMAL HIGH (ref 80.0–100.0)
Monocytes Absolute: 1 10*3/uL (ref 0.1–1.0)
Monocytes Relative: 13 %
Neutro Abs: 3.8 10*3/uL (ref 1.7–7.7)
Neutrophils Relative %: 51 %
Platelets: 207 10*3/uL (ref 150–400)
RBC: 4.16 MIL/uL — ABNORMAL LOW (ref 4.22–5.81)
RDW: 13.3 % (ref 11.5–15.5)
WBC: 7.3 10*3/uL (ref 4.0–10.5)
nRBC: 0 % (ref 0.0–0.2)

## 2020-03-30 LAB — BASIC METABOLIC PANEL
Anion gap: 15 (ref 5–15)
BUN: 15 mg/dL (ref 8–23)
CO2: 25 mmol/L (ref 22–32)
Calcium: 9 mg/dL (ref 8.9–10.3)
Chloride: 98 mmol/L (ref 98–111)
Creatinine, Ser: 1.12 mg/dL (ref 0.61–1.24)
GFR calc Af Amer: >60 mL/min (ref >60)
GFR calc non Af Amer: >60 mL/min (ref >60)
Glucose, Bld: 86 mg/dL (ref 70–99)
Potassium: 4 mmol/L (ref 3.5–5.1)
Sodium: 138 mmol/L (ref 135–145)

## 2020-03-30 LAB — ETHANOL: Alcohol, Ethyl (B): 195 mg/dL — ABNORMAL HIGH (ref <10)

## 2020-03-30 MED ORDER — SODIUM CHLORIDE 0.9 % IV BOLUS
1000.0000 mL | Freq: Once | INTRAVENOUS | Status: AC
  Administered 2020-03-30: 1000 mL via INTRAVENOUS

## 2020-03-30 MED ORDER — TETANUS-DIPHTH-ACELL PERTUSSIS 5-2.5-18.5 LF-MCG/0.5 IM SUSP
0.5000 mL | Freq: Once | INTRAMUSCULAR | Status: AC
  Administered 2020-03-30: 0.5 mL via INTRAMUSCULAR

## 2020-03-30 NOTE — Discharge Instructions (Addendum)
Please hold your next dose of coumadin, please have recheck of your Coumadin level on Tuesday

## 2020-03-30 NOTE — Progress Notes (Signed)
   03/30/20 2140  Clinical Encounter Type  Visited With Health care provider  Visit Type ED  Referral From Nurse  Consult/Referral To Chaplain  Chaplain responded to Level 2 [place]. Chaplain remains available if need arises.This note was prepared by Kathryne Hitch For questions contact my phone at [phone].

## 2020-03-30 NOTE — ED Triage Notes (Signed)
Pt came via EMS.  Per report pt was at the bar and had a fall onto a curb. Pt's intial GCS was a 15.  Pt had LOC at scene and has had bit confusion throughout route .

## 2020-03-30 NOTE — ED Notes (Signed)
Pt took off his c-collar & wont stay still in his bed. Pt keeps moving and shaking his legs.

## 2020-03-30 NOTE — ED Provider Notes (Signed)
[facility] EMERGENCY DEPARTMENT Provider Note   CSN: [id number] Arrival date & time: 03/30/20  2153     History Chief Complaint  Patient presents with  . Fall    Phillip Kline is a 74 y.o. male.  LEVEL 2 TRAUMA, patient drinking at a bar and fell and hit the right side of his head with puncture wound to right side of scalp. Possible LOC. On coumadin for afib. Skin tear to RUE, no other pain. Admits to drinking beers.   The history is provided by the patient.  Fall This is a new problem. The current episode started less than 1 hour ago. The problem occurs constantly. The problem has not changed since onset.Associated symptoms include headaches. Pertinent negatives include no chest pain, no abdominal pain and no shortness of breath. Nothing aggravates the symptoms. Nothing relieves the symptoms. He has tried nothing for the symptoms. The treatment provided no relief.       Past Medical History:  Diagnosis Date  . A-fib ([place])   . Coronary artery disease   . Depression   . Hypertension     There are no problems to display for this patient.   Past Surgical History:  Procedure Laterality Date  . PACEMAKER INSERTION         History reviewed. No pertinent family history.  Social History   Tobacco Use  . Smoking status: Never Smoker  Substance Use Topics  . Alcohol use: Yes  . Drug use: Not Currently    Home Medications Prior to Admission medications   Not on File    Allergies    Patient has no allergy information on record.  Review of Systems   Review of Systems  Constitutional: Negative for chills and fever.  HENT: Negative for ear pain and sore throat.   Eyes: Negative for pain and visual disturbance.  Respiratory: Negative for cough and shortness of breath.   Cardiovascular: Negative for chest pain and palpitations.  Gastrointestinal: Negative for abdominal pain and vomiting.  Genitourinary: Negative for dysuria and hematuria.   Musculoskeletal: Negative for arthralgias and back pain.  Skin: Positive for wound. Negative for color change and rash.  Neurological: Positive for headaches. Negative for seizures and syncope.  All other systems reviewed and are negative.   Physical Exam Updated Vital Signs BP 114/77   Pulse 72   Temp (!) 97 F (36.1 C)   Resp (!) 24   Ht 5\' 5"  (1.651 m)   Wt 64 kg   SpO2 95%   BMI 23.46 kg/m   Physical Exam Vitals and nursing note reviewed.  Constitutional:      General: He is not in acute distress.    Appearance: He is well-developed. He is not ill-appearing.  HENT:     Head:     Comments: Puncture wound to right side of scalp, hemostatic      Nose: Nose normal.     Mouth/Throat:     Mouth: Mucous membranes are moist.  Eyes:     Extraocular Movements: Extraocular movements intact.     Conjunctiva/sclera: Conjunctivae normal.     Pupils: Pupils are equal, round, and reactive to light.  Neck:     Comments: In cervical collar  Cardiovascular:     Rate and Rhythm: Normal rate and regular rhythm.     Heart sounds: No murmur heard.   Pulmonary:     Effort: Pulmonary effort is normal. No respiratory distress.     Breath sounds: Normal  breath sounds.  Abdominal:     Palpations: Abdomen is soft.     Tenderness: There is no abdominal tenderness.  Musculoskeletal:        General: No tenderness.     Cervical back: Neck supple.  Skin:    General: Skin is warm and dry.     Comments: Skin tear to RUE   Neurological:     General: No focal deficit present.     Mental Status: He is alert and oriented to person, place, and time.     Cranial Nerves: No cranial nerve deficit.     Sensory: No sensory deficit.     Motor: No weakness.     Coordination: Coordination normal.     ED Results / Procedures / Treatments   Labs (all labs ordered are listed, but only abnormal results are displayed) Labs Reviewed  CBC WITH DIFFERENTIAL/PLATELET - Abnormal; Notable for the  following components:      Result Value   RBC 4.16 (*)    MCV 103.1 (*)    All other components within normal limits  ETHANOL - Abnormal; Notable for the following components:   Alcohol, Ethyl (B) 195 (*)    All other components within normal limits  I-STAT CHEM 8, ED - Abnormal; Notable for the following components:   Creatinine, Ser 1.30 (*)    Calcium, Ion 0.96 (*)    All other components within normal limits  BASIC METABOLIC PANEL  PROTIME-INR    EKG EKG Interpretation  Date/Time:  Saturday March 30 2020 21:55:21 EDT Ventricular Rate:  60 PR Interval:    QRS Duration: 99 QT Interval:  460 QTC Calculation: 460 R Axis:   60 Text Interpretation: Sinus or ectopic atrial rhythm Borderline T abnormalities, inferior leads Confirmed by Lennice Sites [phone]) on 03/30/2020 10:40:26 PM   Radiology CT Head Wo Contrast  Result Date: 03/30/2020 CLINICAL DATA:  Fall, head injury, loss of consciousness EXAM: CT HEAD WITHOUT CONTRAST TECHNIQUE: Contiguous axial images were obtained from the base of the skull through the vertex without intravenous contrast. COMPARISON:  None. FINDINGS: Brain: Normal anatomic configuration. Tiny remote lacunar infarct right putamen. No abnormal intra or extra-axial mass lesion or fluid collection. No abnormal mass effect or midline shift. No evidence of acute intracranial hemorrhage or infarct. Ventricular size is normal. Cerebellum unremarkable. Vascular: Unremarkable Skull: Intact Sinuses/Orbits: There is mild mucosal thickening within the paranasal sinuses. No air-fluid levels. Sinuses are clear. Orbits are unremarkable. Other: Mastoid air cells and middle ear cavities are clear. Moderate right frontal scalp hematoma. IMPRESSION: Moderate right scalp hematoma. No evidence of acute intracranial injury. No calvarial fracture. Mild paranasal sinus disease Electronically Signed   By: Fidela Salisbury MD   On: 03/30/2020 22:35   CT Cervical Spine Wo  Contrast  Result Date: 03/30/2020 CLINICAL DATA:  Fall onto curb with loss of consciousness and confusion. EXAM: CT CERVICAL SPINE WITHOUT CONTRAST TECHNIQUE: Multidetector CT imaging of the cervical spine was performed without intravenous contrast. Multiplanar CT image reconstructions were also generated. COMPARISON:  CT cervical spine 03/30/2018 FINDINGS: Alignment: Straightening of normal lordosis. Similar retrolisthesis of C3 on C4. Similar anterolisthesis of C7 on T1. No evidence of traumatic subluxation. Skull base and vertebrae: No acute fracture. The dens and skull base are intact. Advanced degenerative change throughout the cervical spine with Modic endplate changes at Y7-C6. Partial bony fusion at C4-C5 and C5-C6 vertebral bodies. Posterior fusion hardware at C4-C5 and C5-C6. Chronic fragmentation at C1-C2. Soft tissues and spinal  canal: No prevertebral fluid or swelling. No visible canal hematoma. Disc levels: Advanced diffuse degenerative disc disease with disc space narrowing and endplate spurring. Multilevel neural foraminal and bony canal stenosis which appears grossly similar to 2019. Upper chest: No acute findings. Other: Carotid calcifications IMPRESSION: 1. No acute fracture or subluxation of the cervical spine. 2. Advanced diffuse degenerative disc disease and facet arthropathy throughout the cervical spine. This is grossly stable from 2019 CT. Posterior fusion hardware at C4-C5 and C5-C6. Electronically Signed   By: Keith Rake M.D.   On: 03/30/2020 22:38    Procedures .Marland KitchenLaceration Repair  Date/Time: 03/30/2020 10:47 PM Performed by: Lennice Sites, DO Authorized by: Lennice Sites, DO   Consent:    Consent obtained:  Verbal   Consent given by:  Patient   Risks discussed:  Infection, need for additional repair, nerve damage, pain, poor cosmetic result, poor wound healing, retained foreign body and tendon damage   Alternatives discussed:  No treatment Anesthesia (see MAR for  exact dosages):    Anesthesia method:  None Laceration details:    Location:  Scalp   Scalp location:  Frontal   Length (cm):  1   Depth (mm):  1 Repair type:    Repair type:  Simple Pre-procedure details:    Preparation:  Patient was prepped and draped in usual sterile fashion Exploration:    Wound extent: no areolar tissue violation noted, no fascia violation noted, no foreign bodies/material noted, no muscle damage noted, no nerve damage noted, no tendon damage noted, no underlying fracture noted and no vascular damage noted     Contaminated: no   Treatment:    Area cleansed with:  Saline   Amount of cleaning:  Standard   Irrigation solution:  Sterile saline   Irrigation volume:  500 cc   Visualized foreign bodies/material removed: no   Skin repair:    Repair method:  Staples   Number of staples:  2 Approximation:    Approximation:  Close Post-procedure details:    Dressing:  Open (no dressing)   Patient tolerance of procedure:  Tolerated well, no immediate complications   (including critical care time)  Medications Ordered in ED Medications  Tdap (BOOSTRIX) injection 0.5 mL (0.5 mLs Intramuscular Given 03/30/20 2210)  sodium chloride 0.9 % bolus 1,000 mL (1,000 mLs Intravenous New Bag/Given 03/30/20 2249)    ED Course  I have reviewed the triage vital signs and the nursing notes.  Pertinent labs & imaging results that were available during my care of the patient were reviewed by me and considered in my medical decision making (see chart for details).    MDM Rules/Calculators/A&P                          Tiler Brandis is a 74 year old male with history of atrial fibrillation on Coumadin who presents to the ED as a level 2 fall on blood thinners.  Normal vitals.  No fever.  Patient had a witnessed fall at a bar hit the right side of his head.  Had some drinking tonight.  Has small puncture wound to the right side of the scalp that was stapled and was hemostatic.  Has skin  tear to the right arm.  Does not have any extremity tenderness.  Overall appears intoxicated.  Head CT and neck CT are unremarkable.  Ethanol level is elevated to almost 200.  Otherwise lab work is unremarkable.  Will check INR.  Will allow  patient to metabolize and will discharge once clinically sober.  Handed off to oncoming ED staff.  Hemodynamically stable throughout my care.  Tetanus shot updated.  This chart was dictated using voice recognition software.  Despite best efforts to proofread,  errors can occur which can change the documentation meaning.     Final Clinical Impression(s) / ED Diagnoses Final diagnoses:  Fall, initial encounter  Laceration of scalp, initial encounter  Blood alcohol level of 120-199 mg/100 ml    Rx / DC Orders ED Discharge Orders    None       Lennice Sites, DO 03/30/20 2251

## 2020-03-30 NOTE — ED Notes (Signed)
Eyad, friend, [phone] would like an update

## 2020-03-30 NOTE — ED Notes (Signed)
Patient is resting comfortably. 

## 2020-03-30 NOTE — ED Notes (Signed)
Lab contacted to run PT/INR

## 2020-03-30 NOTE — ED Notes (Signed)
Patient transported to CT 

## 2020-03-31 LAB — PROTIME-INR
INR: 3.3 — ABNORMAL HIGH (ref 0.8–1.2)
Prothrombin Time: 32.7 seconds — ABNORMAL HIGH (ref 11.4–15.2)

## 2020-03-31 LAB — DIGOXIN LEVEL: Digoxin Level: <0.2 ng/mL — ABNORMAL LOW (ref 0.8–2.0)

## 2020-03-31 NOTE — ED Provider Notes (Signed)
Patient is improved.  Patient is ambulatory.  Advised patient to hold next dose of Coumadin, and get a recheck of his INR next week.  Patient reports that he does have a follow-up next week for his INR level. Patient is awake alert in no acute distress.  Patient currently has a ride home.  I called the lab and his digoxin level is negative. We discussed wound care of his stapled wound   Ripley Fraise, MD 03/31/20 [phone]

## 2020-03-31 NOTE — ED Notes (Signed)
Discharge instructions discussed with pt. Pt verbalized understanding. Pt stable and ambulatory. No signature pad available. 

## 2020-03-31 NOTE — ED Notes (Signed)
Attempted to call friend, Elmyra Ricks. No answer

## 2020-04-02 ENCOUNTER — Encounter: Payer: Self-pay | Admitting: Internal Medicine

## 2020-04-02 ENCOUNTER — Telehealth: Payer: Self-pay | Admitting: Pharmacist

## 2020-04-02 NOTE — Telephone Encounter (Signed)
Patient called to report he was in the hospital over the weekend because he fell and hit his head and does not remember it happening.  Warfarin was hold for one dose.  Also reports he injured his leg which is swollen and painful.  Recommended to have looked at by PCP or go to ER if pain persists.  Has recheck INR scheduled on 9/15

## 2020-04-03 ENCOUNTER — Encounter (HOSPITAL_COMMUNITY): Payer: Self-pay

## 2020-04-03 ENCOUNTER — Other Ambulatory Visit: Payer: Self-pay

## 2020-04-03 ENCOUNTER — Emergency Department (HOSPITAL_COMMUNITY)
Admission: EM | Admit: 2020-04-03 | Discharge: 2020-04-04 | Disposition: A | Discharge status: Home / Self Care | Payer: POS | Attending: Emergency Medicine | Admitting: Emergency Medicine

## 2020-04-03 ENCOUNTER — Emergency Department (HOSPITAL_COMMUNITY): Payer: POS

## 2020-04-03 ENCOUNTER — Ambulatory Visit (HOSPITAL_COMMUNITY)
Admission: EM | Admit: 2020-04-03 | Discharge: 2020-04-03 | Disposition: A | Discharge status: Home / Self Care | Payer: POS

## 2020-04-03 DIAGNOSIS — W19XXXA Unspecified fall, initial encounter: Secondary | ICD-10-CM | POA: Insufficient documentation

## 2020-04-03 DIAGNOSIS — Y9289 Other specified places as the place of occurrence of the external cause: Secondary | ICD-10-CM | POA: Diagnosis not present

## 2020-04-03 DIAGNOSIS — R2241 Localized swelling, mass and lump, right lower limb: Secondary | ICD-10-CM | POA: Diagnosis not present

## 2020-04-03 DIAGNOSIS — Z5321 Procedure and treatment not carried out due to patient leaving prior to being seen by health care provider: Secondary | ICD-10-CM | POA: Diagnosis not present

## 2020-04-03 DIAGNOSIS — Y999 Unspecified external cause status: Secondary | ICD-10-CM | POA: Insufficient documentation

## 2020-04-03 DIAGNOSIS — Y9389 Activity, other specified: Secondary | ICD-10-CM | POA: Diagnosis not present

## 2020-04-03 DIAGNOSIS — R519 Headache, unspecified: Secondary | ICD-10-CM | POA: Insufficient documentation

## 2020-04-03 LAB — CBC WITH DIFFERENTIAL/PLATELET
Abs Immature Granulocytes: 0.09 10*3/uL — ABNORMAL HIGH (ref 0.00–0.07)
Basophils Absolute: 0.1 10*3/uL (ref 0.0–0.1)
Basophils Relative: 1 %
Eosinophils Absolute: 0.1 10*3/uL (ref 0.0–0.5)
Eosinophils Relative: 1 %
HCT: 32.1 % — ABNORMAL LOW (ref 39.0–52.0)
Hemoglobin: 10.5 g/dL — ABNORMAL LOW (ref 13.0–17.0)
Immature Granulocytes: 1 %
Lymphocytes Relative: 17 %
Lymphs Abs: 1.5 10*3/uL (ref 0.7–4.0)
MCH: 33.4 pg (ref 26.0–34.0)
MCHC: 32.7 g/dL (ref 30.0–36.0)
MCV: 102.2 fL — ABNORMAL HIGH (ref 80.0–100.0)
Monocytes Absolute: 1.2 10*3/uL — ABNORMAL HIGH (ref 0.1–1.0)
Monocytes Relative: 13 %
Neutro Abs: 6.3 10*3/uL (ref 1.7–7.7)
Neutrophils Relative %: 67 %
Platelets: 197 10*3/uL (ref 150–400)
RBC: 3.14 MIL/uL — ABNORMAL LOW (ref 4.22–5.81)
RDW: 14.3 % (ref 11.5–15.5)
WBC: 9.2 10*3/uL (ref 4.0–10.5)
nRBC: 0 % (ref 0.0–0.2)

## 2020-04-03 LAB — BASIC METABOLIC PANEL
Anion gap: 9 (ref 5–15)
BUN: 19 mg/dL (ref 8–23)
CO2: 28 mmol/L (ref 22–32)
Calcium: 9 mg/dL (ref 8.9–10.3)
Chloride: 98 mmol/L (ref 98–111)
Creatinine, Ser: 0.92 mg/dL (ref 0.61–1.24)
GFR calc Af Amer: >60 mL/min (ref >60)
GFR calc non Af Amer: >60 mL/min (ref >60)
Glucose, Bld: 115 mg/dL — ABNORMAL HIGH (ref 70–99)
Potassium: 4.8 mmol/L (ref 3.5–5.1)
Sodium: 135 mmol/L (ref 135–145)

## 2020-04-03 NOTE — ED Triage Notes (Signed)
Patient fell this past Saturday and treated for head injury. Patient now complains of ongoing head pain and has developed right leg pain with swelling to femur. Patient states severe pain with ambulation. Old bruises noted to legs

## 2020-04-03 NOTE — ED Triage Notes (Signed)
Pt states he fell Saturday night and was evaluated/tx for same at ER for Level II trauma and head wound. Pt c/o new onset blurred/double vision last night and new swelling/hematoma to right inner thigh. C/o pain to lateral aspect of right thigh, difficulty moving right leg, uses homemade sling to right ankle to lift right leg.  Has original dsg/packing to right head wound. AOx4, denies fluid from ears, new LOC or sleepiness, n/v, CP, SOB. +2 DP pulse to right foot. Per Dr. Mannie Stabile, pt advised to go to ER STAT for additional CT scan of head to r/o subdural hematoma or other TBI and for eval of large hematoma to right leg. Pt verbalized understanding.  Patient is being discharged from the Urgent Care and sent to the Emergency Department via POV . Per Dr. Mannie Stabile patient is in need of higher level of care due to r/o TBI, blurred vision, right leg hematoma Patient is aware and verbalizes understanding of plan of care.  Vitals:   04/03/20 1301  BP: 103/78  Pulse: (!) 104  Resp: 20  Temp: 99.4 F (37.4 C)  SpO2: 99%

## 2020-04-03 NOTE — Telephone Encounter (Signed)
Patient called back again today. States his leg hurts and is swollen. He has been taking more than 6 tablets of APAP per day as he wasn't given any pain meds. Asking if he should have his leg looked at when he goes back to ER to get his wallet he left. I advised that he should either be seen in ED or urgent care may be faster. His PCP is at [place] and is more of a mental health provider. I advised that he should not take more than 6 tabs of APAP (500mg  tablets) at most 8 tablets. As this can increase INR at high does and is hard on the liver- will increase risk of bleeding as well at high doses.  Encouraged patient to have leg examined by doctor.

## 2020-04-10 ENCOUNTER — Ambulatory Visit (HOSPITAL_COMMUNITY)
Admission: EM | Admit: 2020-04-10 | Discharge: 2020-04-10 | Disposition: A | Discharge status: Home / Self Care | Payer: POS

## 2020-04-10 ENCOUNTER — Other Ambulatory Visit: Payer: Self-pay

## 2020-04-10 ENCOUNTER — Ambulatory Visit (INDEPENDENT_AMBULATORY_CARE_PROVIDER_SITE_OTHER): Payer: POS | Admitting: *Deleted

## 2020-04-10 DIAGNOSIS — Z7901 Long term (current) use of anticoagulants: Secondary | ICD-10-CM | POA: Diagnosis not present

## 2020-04-10 DIAGNOSIS — I4891 Unspecified atrial fibrillation: Secondary | ICD-10-CM | POA: Diagnosis not present

## 2020-04-10 DIAGNOSIS — Z5181 Encounter for therapeutic drug level monitoring: Secondary | ICD-10-CM

## 2020-04-10 LAB — POCT INR: INR: 2.8 (ref 2.0–3.0)

## 2020-04-10 NOTE — Patient Instructions (Signed)
Description   Continue taking Warfarin 2 tablets every day except 1 tablet on Wednesdays.  Recheck in 8 weeks. Coumadin Clinic [phone].  Main # [phone]      

## 2020-06-05 ENCOUNTER — Ambulatory Visit: Payer: POS

## 2020-06-07 ENCOUNTER — Other Ambulatory Visit: Payer: Self-pay

## 2020-06-07 ENCOUNTER — Ambulatory Visit (INDEPENDENT_AMBULATORY_CARE_PROVIDER_SITE_OTHER): Payer: POS | Admitting: *Deleted

## 2020-06-07 DIAGNOSIS — Z7901 Long term (current) use of anticoagulants: Secondary | ICD-10-CM | POA: Diagnosis not present

## 2020-06-07 DIAGNOSIS — Z5181 Encounter for therapeutic drug level monitoring: Secondary | ICD-10-CM | POA: Diagnosis not present

## 2020-06-07 DIAGNOSIS — I4891 Unspecified atrial fibrillation: Secondary | ICD-10-CM

## 2020-06-07 LAB — POCT INR: INR: 2 (ref 2.0–3.0)

## 2020-06-07 NOTE — Patient Instructions (Signed)
Description   Continue taking Warfarin 2 tablets every day except 1 tablet on Wednesdays.  Recheck in 8 weeks. [facility] [phone].  Main # [phone]

## 2020-08-02 ENCOUNTER — Ambulatory Visit (INDEPENDENT_AMBULATORY_CARE_PROVIDER_SITE_OTHER): Payer: POS | Admitting: *Deleted

## 2020-08-02 ENCOUNTER — Other Ambulatory Visit: Payer: Self-pay

## 2020-08-02 DIAGNOSIS — I4891 Unspecified atrial fibrillation: Secondary | ICD-10-CM | POA: Diagnosis not present

## 2020-08-02 DIAGNOSIS — Z7901 Long term (current) use of anticoagulants: Secondary | ICD-10-CM

## 2020-08-02 DIAGNOSIS — Z5181 Encounter for therapeutic drug level monitoring: Secondary | ICD-10-CM

## 2020-08-02 LAB — POCT INR: INR: 1.9 — AB (ref 2.0–3.0)

## 2020-08-02 MED ORDER — WARFARIN SODIUM 2 MG PO TABS: ORAL_TABLET | ORAL | 3 refills | Status: DC

## 2020-08-02 NOTE — Patient Instructions (Addendum)
Description   Since you took extra tablets the last 2 days, continue taking Warfarin 2 tablets every day except 1 tablet on Wednesdays. Recheck in 5 weeks (normally 8 weeks). [facility] [phone].  Main # [phone]

## 2020-09-06 ENCOUNTER — Ambulatory Visit (INDEPENDENT_AMBULATORY_CARE_PROVIDER_SITE_OTHER): Payer: POS | Admitting: *Deleted

## 2020-09-06 ENCOUNTER — Other Ambulatory Visit: Payer: Self-pay

## 2020-09-06 DIAGNOSIS — Z5181 Encounter for therapeutic drug level monitoring: Secondary | ICD-10-CM

## 2020-09-06 DIAGNOSIS — I4891 Unspecified atrial fibrillation: Secondary | ICD-10-CM | POA: Diagnosis not present

## 2020-09-06 DIAGNOSIS — Z7901 Long term (current) use of anticoagulants: Secondary | ICD-10-CM

## 2020-09-06 LAB — POCT INR: INR: 1.8 — AB (ref 2.0–3.0)

## 2020-09-06 NOTE — Patient Instructions (Signed)
Description    Take 3 tablets of warfarin today and then start taking 2 tablets daily. Recheck INR in 3 weeks. [facility] [phone].  Main # [phone]

## 2020-09-09 ENCOUNTER — Ambulatory Visit (INDEPENDENT_AMBULATORY_CARE_PROVIDER_SITE_OTHER): Payer: POS

## 2020-09-09 DIAGNOSIS — I4891 Unspecified atrial fibrillation: Secondary | ICD-10-CM | POA: Diagnosis not present

## 2020-09-09 LAB — CUP PACEART REMOTE DEVICE CHECK
Battery Remaining Longevity: 54 mo
Battery Remaining Percentage: 57 %
Brady Statistic RA Percent Paced: 9 %
Brady Statistic RV Percent Paced: 2 %
Date Time Interrogation Session: 20220213131500
Implantable Lead Implant Date: 20050517
Implantable Lead Implant Date: 20050517
Implantable Lead Location: 753859
Implantable Lead Location: 753860
Implantable Lead Model: 4086
Implantable Lead Model: 4087
Implantable Lead Serial Number: 218470
Implantable Lead Serial Number: 237198
Implantable Pulse Generator Implant Date: 20150112
Lead Channel Impedance Value: 480 Ohm
Lead Channel Impedance Value: 668 Ohm
Lead Channel Pacing Threshold Amplitude: 1.1 V
Lead Channel Pacing Threshold Pulse Width: 0.4 ms
Lead Channel Setting Pacing Amplitude: 2.4 V
Lead Channel Setting Pacing Amplitude: 2.4 V
Lead Channel Setting Pacing Pulse Width: 0.4 ms
Lead Channel Setting Sensing Sensitivity: 2.5 mV
Pulse Gen Serial Number: 389910

## 2020-09-12 NOTE — Progress Notes (Signed)
Remote pacemaker transmission.   

## 2020-10-02 ENCOUNTER — Ambulatory Visit (INDEPENDENT_AMBULATORY_CARE_PROVIDER_SITE_OTHER): Payer: POS | Admitting: *Deleted

## 2020-10-02 ENCOUNTER — Other Ambulatory Visit: Payer: Self-pay

## 2020-10-02 DIAGNOSIS — Z5181 Encounter for therapeutic drug level monitoring: Secondary | ICD-10-CM | POA: Diagnosis not present

## 2020-10-02 DIAGNOSIS — Z7901 Long term (current) use of anticoagulants: Secondary | ICD-10-CM | POA: Diagnosis not present

## 2020-10-02 DIAGNOSIS — I4891 Unspecified atrial fibrillation: Secondary | ICD-10-CM

## 2020-10-02 LAB — POCT INR: INR: 2.8 (ref 2.0–3.0)

## 2020-10-02 NOTE — Patient Instructions (Signed)
Description    Continue taking 2 tablets daily. Recheck INR in 4 weeks. [facility] [phone].  Main # [phone]

## 2020-10-30 ENCOUNTER — Ambulatory Visit (INDEPENDENT_AMBULATORY_CARE_PROVIDER_SITE_OTHER): Payer: POS | Admitting: *Deleted

## 2020-10-30 ENCOUNTER — Other Ambulatory Visit: Payer: Self-pay

## 2020-10-30 DIAGNOSIS — I4891 Unspecified atrial fibrillation: Secondary | ICD-10-CM

## 2020-10-30 DIAGNOSIS — Z7901 Long term (current) use of anticoagulants: Secondary | ICD-10-CM

## 2020-10-30 DIAGNOSIS — Z5181 Encounter for therapeutic drug level monitoring: Secondary | ICD-10-CM | POA: Diagnosis not present

## 2020-10-30 LAB — POCT INR: INR: 2.3 (ref 2.0–3.0)

## 2020-10-30 NOTE — Patient Instructions (Addendum)
Description   Continue taking the dose you have been taking, which is, 2 tablets daily except 1 tablet on Wednesdays. Recheck INR in 5 weeks. [facility] [phone].  Main # [phone]

## 2020-11-27 ENCOUNTER — Other Ambulatory Visit: Payer: Self-pay | Admitting: Internal Medicine

## 2020-12-04 ENCOUNTER — Other Ambulatory Visit: Payer: Self-pay

## 2020-12-04 ENCOUNTER — Ambulatory Visit (INDEPENDENT_AMBULATORY_CARE_PROVIDER_SITE_OTHER): Payer: POS

## 2020-12-04 DIAGNOSIS — Z5181 Encounter for therapeutic drug level monitoring: Secondary | ICD-10-CM | POA: Diagnosis not present

## 2020-12-04 DIAGNOSIS — Z7901 Long term (current) use of anticoagulants: Secondary | ICD-10-CM | POA: Diagnosis not present

## 2020-12-04 DIAGNOSIS — I4891 Unspecified atrial fibrillation: Secondary | ICD-10-CM

## 2020-12-04 LAB — POCT INR: INR: 2.1 (ref 2.0–3.0)

## 2020-12-04 NOTE — Patient Instructions (Signed)
Description   Continue on same dosage 2 tablets daily except 1 tablet on Wednesdays. Recheck INR in 6 weeks. [facility] [phone].  Main # [phone]

## 2020-12-06 ENCOUNTER — Telehealth: Payer: Self-pay | Admitting: *Deleted

## 2020-12-06 NOTE — Telephone Encounter (Signed)
   [facility] Pre-operative Risk Assessment    Patient Name: Phillip Kline  DOB: Feb 01, 1946  MRN: [id number]   HEARTCARE STAFF: - Please ensure there is not already an duplicate clearance open for this procedure. - Under Visit Info/Reason for Call, type in Other and utilize the format Clearance MM/DD/YY or Clearance TBD. Do not use dashes or single digits. - If request is for dental extraction, please clarify the # of teeth to be extracted.  Request for surgical clearance:  1. What type of surgery is being performed? MOHS SURGERY: SITE; LEFT ANTERIOR PROXIMAL THIGH   2. When is this surgery scheduled? TBD   3. What type of clearance is required (medical clearance vs. Pharmacy clearance to hold med vs. Both)? PHARM  4. Are there any medications that need to be held prior to surgery and how long? COUMADIN   5. Practice name and name of physician performing surgery? [facility]; PODIATRY SURGERY   6. What is the office phone number? [phone] [phone]   7.   What is the office fax number? [phone] OR [phone] ATTN: Hughes Better, MSN, RN  8.   Anesthesia type (None, local, MAC, general) ? NOT LISTED    Julaine Hua 12/06/2020, 3:03 PM  _________________________________________________________________   (provider comments below)

## 2020-12-09 ENCOUNTER — Ambulatory Visit (INDEPENDENT_AMBULATORY_CARE_PROVIDER_SITE_OTHER): Payer: POS

## 2020-12-09 DIAGNOSIS — I4891 Unspecified atrial fibrillation: Secondary | ICD-10-CM | POA: Diagnosis not present

## 2020-12-09 NOTE — Telephone Encounter (Signed)
Patient with diagnosis of A Fib on warfarin for anticoagulation.    Of note, patient has not been seen by cardiology since 06/21/19   Procedure: MOHS SURGERY: SITE; LEFT ANTERIOR PROXIMAL THIGH   Date of procedure: TBD  CHA2DS2-VASc Score = 4  This indicates a 4.8% annual risk of stroke. The patient's score is based upon: CHF History: No HTN History: Yes Diabetes History: No Stroke History: No Vascular Disease History: Yes Age Score: 2 Gender Score: 0    CrCl 63 mL.min Platelet count 197K   Per office protocol, patient can hold warfarin for 5 days prior to procedure unless risk score changes after office visit with cardiology  Patient will not need bridging with Lovenox (enoxaparin) around procedure.  If not bridging, patient should restart warfarin on the evening of procedure or day after, at discretion of procedure MD

## 2020-12-09 NOTE — Telephone Encounter (Signed)
Will send message to Dr. Tanna Furry scheduler [place] for appt.

## 2020-12-09 NOTE — Telephone Encounter (Signed)
   Name: Phillip Kline  DOB: 09/30/1945  MRN: [id number]  Primary Cardiologist: Cristopher Peru, MD  Chart reviewed as part of pre-operative protocol coverage. Because of Phillip Kline past medical history and time since last visit, he will require a follow-up visit in order to better assess preoperative cardiovascular risk.  Pre-op covering staff: - Please schedule appointment and call patient to inform them. If patient already had an upcoming appointment within acceptable timeframe, please add "pre-op clearance" to the appointment notes so provider is aware. - Please contact requesting surgeon's office via preferred method (i.e, phone, fax) to inform them of need for appointment prior to surgery.  If applicable, this message will also be routed to pharmacy pool and/or primary cardiologist for input on holding anticoagulant/antiplatelet agent as requested below so that this information is available to the clearing provider at time of patient's appointment.   [place], PA  12/09/2020, 8:50 AM

## 2020-12-11 LAB — CUP PACEART REMOTE DEVICE CHECK
Battery Remaining Longevity: 48 mo
Battery Remaining Percentage: 53 %
Brady Statistic RA Percent Paced: 8 %
Brady Statistic RV Percent Paced: 2 %
Date Time Interrogation Session: 20220518012400
Implantable Lead Implant Date: 20050517
Implantable Lead Implant Date: 20050517
Implantable Lead Location: 753859
Implantable Lead Location: 753860
Implantable Lead Model: 4086
Implantable Lead Model: 4087
Implantable Lead Serial Number: 218470
Implantable Lead Serial Number: 237198
Implantable Pulse Generator Implant Date: 20150112
Lead Channel Impedance Value: 426 Ohm
Lead Channel Impedance Value: 699 Ohm
Lead Channel Pacing Threshold Amplitude: 1.1 V
Lead Channel Pacing Threshold Pulse Width: 0.4 ms
Lead Channel Setting Pacing Amplitude: 2.4 V
Lead Channel Setting Pacing Amplitude: 2.4 V
Lead Channel Setting Pacing Pulse Width: 0.4 ms
Lead Channel Setting Sensing Sensitivity: 2.5 mV
Pulse Gen Serial Number: 389910

## 2020-12-11 NOTE — Telephone Encounter (Signed)
EP scheduler Doylene Canning has left message for the to call back and schedule a pre op appt though she states pt has yet to return her call.

## 2020-12-13 NOTE — Telephone Encounter (Signed)
EP scheduler Doylene Canning has tried to reach pt to schedule a pre op appt. I called today to see if I could reach the pt. I left a vm to please call the office and ask to s/w [place] to schedule a pre op appt.

## 2020-12-16 NOTE — Telephone Encounter (Signed)
Pt has appt with Joesph July, [facility] 12/19/20 for pre op clearance. I will forward notes to [facility] for upcoming appt. Will send FYI to requesting office pt has appt 12/19/20.

## 2020-12-18 NOTE — Progress Notes (Signed)
Electrophysiology Office Note Date: 12/19/2020  ID:  Phillip, Kline [birth year], MRN [id number]  PCP: [facility] Primary Cardiologist: Cristopher Peru, MD Electrophysiologist: Cristopher Peru, MD   CC: Pacemaker follow-up  WILLAIM MODE is a 75 y.o. male seen today for Cristopher Peru, MD for cardiac clearance for MOHS anterior left thigh, for basal cell.  Since last being seen in our clinic the patient reports doing well overall. He remains active.  he denies chest pain, palpitations, dyspnea, PND, orthopnea, nausea, vomiting, dizziness, syncope, edema, weight gain, or early satiety.  Device History: Engineer, agricultural PPM implanted 2005, gen change 2015 for SND  Past Medical History:  Diagnosis Date  . A-fib ([place])   . Abdominal hernia   . Anxiety   . Arthritis   . Atrial fibrillation ([place])   . Atrial flutter ([place])   . Blood transfusion without reported diagnosis   . Cataract    bilateral cataracts removed  . Clotting disorder ([facility])    coumadin - a fib  . Coronary artery disease   . Depression   . Hyperlipidemia   . Hypertension   . Pacemaker    [employer]  . Sinus bradycardia   . Sleep apnea    wears a c-pap   Past Surgical History:  Procedure Laterality Date  . CATARACT EXTRACTION    . COLONOSCOPY    . hernia repair    . PACEMAKER INSERTION    . PERMANENT PACEMAKER GENERATOR CHANGE N/A 08/07/2013   Procedure: PERMANENT PACEMAKER GENERATOR CHANGE;  Surgeon: Evans Lance, MD;  Location: [facility] CATH LAB;  Service: Cardiovascular;  Laterality: N/A;  . PERMANENT PACEMAKER INSERTION  2005; 08/07/2013   BSX dual chamber pacemaker implanted 2005 for symptomatic bradycardia; gen change 2015 by Dr Lovena Le  . REPLACEMENT TOTAL KNEE     right knee  . thumb surgery     left and right  . TONSILLECTOMY      Current Outpatient Medications  Medication Sig Dispense Refill  . acetaminophen (TYLENOL) 325 MG tablet Take 2 tablets (650 mg total) by  mouth every 6 (six) hours as needed for fever.    Marland Kitchen atorvastatin (LIPITOR) 80 MG tablet Take 40 mg by mouth at bedtime.     . busPIRone (BUSPAR) 10 MG tablet TAKE ONE-HALF TABLET BY MOUTH IN THE MORNING AND EVENING FOR 7 DAYS, THEN TAKE ONE TABLET IN THE MORNING AND EVENING FOR IRRITABILITY    . Calcium Carbonate-Vitamin D 500-125 MG-UNIT TABS Take 1 tablet by mouth 2 (two) times daily.    . Carboxymethylcellulose Sodium 1 % GEL Apply to eye as needed.    . digoxin (LANOXIN) 0.125 MG tablet TAKE 1 TABLET BY MOUTH DAILY. PLEASE KEEP NOVEMBER APPT TO GET MORE REFILLS. 90 tablet 3  . hydrochlorothiazide (MICROZIDE) 12.5 MG capsule Take 12.5 mg by mouth daily.    Marland Kitchen LISINOPRIL PO Take by mouth.    . prednisoLONE acetate (PRED FORTE) 1 % ophthalmic suspension Place 1 drop into both eyes every 7 (seven) days.     Marland Kitchen QUEtiapine (SEROQUEL) 25 MG tablet Take 12.5 mg by mouth at bedtime.     . sertraline (ZOLOFT) 100 MG tablet Take 200 mg by mouth daily.     . sildenafil (VIAGRA) 50 MG tablet Take by mouth as needed.    . thiamine 100 MG tablet Take 100 mg by mouth daily.    . verapamil (CALAN-SR) 180 MG CR tablet Take 1 tablet by  mouth daily.    . vitamin B-12 (CYANOCOBALAMIN) 500 MCG tablet Take 500 mcg by mouth 2 (two) times daily.    Marland Kitchen warfarin (COUMADIN) 2 MG tablet TAKE 2 TABLETS BY MOUTH DAILY. EXCEPT 1 TABLET ON WEDNESDAYS OR AS DIRECTED BY COAGULATION CLINIC 60 tablet 3   No current facility-administered medications for this visit.    Allergies:   Patient has no known allergies.   Social History: Social History   Socioeconomic History  . Marital status: Single    Spouse name: Not on file  . Number of children: Not on file  . Years of education: Not on file  . Highest education level: Not on file  Occupational History  . Not on file  Tobacco Use  . Smoking status: Never Smoker  . Smokeless tobacco: Never Used  . Tobacco comment: Marijuana  Vaping Use  . Vaping Use: Never used   Substance and Sexual Activity  . Alcohol use: Yes    Comment: 4-5 beers weekly  . Drug use: Not Currently    Types: Marijuana    Comment: smoked marijuana 09-26-16  . Sexual activity: Yes    Partners: Female    Birth control/protection: None  Other Topics Concern  . Not on file  Social History Narrative   ** Merged History Encounter **       Pt reports having a girlfriend but plans to break up with her   Social Determinants of Health   Financial Resource Strain: Not on file  Food Insecurity: Not on file  Transportation Needs: Not on file  Physical Activity: Not on file  Stress: Not on file  Social Connections: Not on file  Intimate Partner Violence: Not on file    Family History: Family History  Problem Relation Age of Onset  . Cancer Father        lung cancer  . Stomach cancer Mother   . Stomach cancer Sister   . Colon cancer Neg Hx   . Esophageal cancer Neg Hx   . Pancreatic cancer Neg Hx   . Prostate cancer Neg Hx   . Rectal cancer Neg Hx      Review of Systems: All other systems reviewed and are otherwise negative except as noted above.  Physical Exam: Vitals:   12/19/20 0855  BP: 120/90  Pulse: 82  SpO2: 97%  Weight: 149 lb (67.6 kg)  Height: 5\' 4"  (1.626 m)     GEN- The patient is well appearing, alert and oriented x 3 today.   HEENT: normocephalic, atraumatic; sclera clear, conjunctiva pink; hearing intact; oropharynx clear; neck supple  Lungs- Clear to ausculation bilaterally, normal work of breathing.  No wheezes, rales, rhonchi Heart- Regular rate and rhythm, no murmurs, rubs or gallops  GI- soft, non-tender, non-distended, bowel sounds present  Extremities- no clubbing or cyanosis. No edema MS- no significant deformity or atrophy Skin- warm and dry, no rash or lesion; PPM pocket well healed Psych- euthymic mood, full affect Neuro- strength and sensation are intact  PPM Interrogation- reviewed in detail today,  See PACEART report  EKG:   EKG is ordered today. The ekg ordered today shows Atrial fibrillation at 82 bpm   Recent Labs: 04/03/2020: BUN 19; Creatinine, Ser 0.92; Hemoglobin 10.5; Platelets 197; Potassium 4.8; Sodium 135   Wt Readings from Last 3 Encounters:  12/19/20 149 lb (67.6 kg)  04/03/20 141 lb (64 kg)  03/30/20 141 lb (64 kg)     Other studies Reviewed: Additional studies/ records that  were reviewed today include: Previous EP office notes, Previous remote checks, Most recent labwork.   Assessment and Plan:  1. SND s/p [employer] PPM  Normal PPM function See Claudia Desanctis Art report No changes today  2. Longstanding persistent atrial fibrillation Rates 90-100 mostly Will add back Toprol 12.5 mg daily Labs to include dig level today.  Continue coumadin as below  3. Secondary Hypercoagulable State (ICD10:  D68.69) The patient is at high risk of stroke/thromboembolism with CHA2DS2VASC of 4   Coumadin per coumadin clinic. He has been cleared to hold coumadin 5 days prior to procedure, ideally resume the evening of the procedure, with close coumadin clinic follow up once his procedure has been scheduled.   4. HTN Adding toprol as above for HR control   5. Cardiac Clearance for Left anterior thigh MOHS Pt is at relatively low risk to proceed by Revised Cardiac Risk Index [place] Criteria) Pt has been instructed he can hold coumadin up to 5 days prior to procedure and resume as soon as safe afterwards.  He understands possible risk of AF with RVR after or during surgery from pain response. He understands possible risk of stroke being off [facility] for surgery.   Current medicines are reviewed at length with the patient today.   The patient does not have concerns regarding his medicines.  The following changes were made today:  none  Labs/ tests ordered today include:  Orders Placed This Encounter  Procedures  . Basic metabolic panel  . CBC  . Digoxin level  . CUP PACEART [place]  . EKG  12-Lead    Disposition:   Follow up with Dr. Lovena Le in 12 Months    Signed, Annamaria Helling  12/19/2020 8:59 AM  [facility] [address] [place]  [place] [address] [phone] (office) [phone] (fax)

## 2020-12-19 ENCOUNTER — Ambulatory Visit (INDEPENDENT_AMBULATORY_CARE_PROVIDER_SITE_OTHER): Payer: POS | Admitting: Student

## 2020-12-19 ENCOUNTER — Other Ambulatory Visit: Payer: Self-pay

## 2020-12-19 ENCOUNTER — Encounter: Payer: Self-pay | Admitting: Student

## 2020-12-19 VITALS — BP 120/90 | HR 82 | Ht 64.0 in | Wt 149.0 lb

## 2020-12-19 DIAGNOSIS — Z950 Presence of cardiac pacemaker: Secondary | ICD-10-CM

## 2020-12-19 DIAGNOSIS — I4891 Unspecified atrial fibrillation: Secondary | ICD-10-CM | POA: Diagnosis not present

## 2020-12-19 DIAGNOSIS — Z7901 Long term (current) use of anticoagulants: Secondary | ICD-10-CM | POA: Diagnosis not present

## 2020-12-19 DIAGNOSIS — I495 Sick sinus syndrome: Secondary | ICD-10-CM | POA: Diagnosis not present

## 2020-12-19 LAB — CUP PACEART INCLINIC DEVICE CHECK
Brady Statistic RA Percent Paced: 8 %
Brady Statistic RV Percent Paced: 2 %
Date Time Interrogation Session: 20220526091125
Implantable Lead Implant Date: 20050517
Implantable Lead Implant Date: 20050517
Implantable Lead Location: 753859
Implantable Lead Location: 753860
Implantable Lead Model: 4086
Implantable Lead Model: 4087
Implantable Lead Serial Number: 218470
Implantable Lead Serial Number: 237198
Implantable Pulse Generator Implant Date: 20150112
Lead Channel Impedance Value: 481 Ohm
Lead Channel Impedance Value: 664 Ohm
Lead Channel Pacing Threshold Amplitude: 1 V
Lead Channel Pacing Threshold Pulse Width: 0.4 ms
Lead Channel Sensing Intrinsic Amplitude: 0.3 mV
Lead Channel Sensing Intrinsic Amplitude: 9.1 mV
Lead Channel Setting Pacing Amplitude: 2.4 V
Lead Channel Setting Pacing Amplitude: 2.4 V
Lead Channel Setting Pacing Pulse Width: 0.4 ms
Lead Channel Setting Sensing Sensitivity: 2.5 mV
Pulse Gen Serial Number: 389910

## 2020-12-19 MED ORDER — METOPROLOL SUCCINATE ER 25 MG PO TB24: 12.5000 mg | ORAL_TABLET | Freq: Every day | ORAL | 3 refills | Status: DC

## 2020-12-19 NOTE — Patient Instructions (Signed)
Medication Instructions:  Your physician has recommended you make the following change in your medication:   START: Metoprolol Succinate 12.5mg  daily  *If you need a refill on your cardiac medications before your next appointment, please call your pharmacy*   Lab Work: TODAY: BMET, CBC, Digoxin Level  If you have labs (blood work) drawn today and your tests are completely normal, you will receive your results only by: Marland Kitchen MyChart Message (if you have MyChart) OR . A paper copy in the mail If you have any lab test that is abnormal or we need to change your treatment, we will call you to review the results.   Follow-Up: At [facility], you and your health needs are our priority.  As part of our continuing mission to provide you with exceptional heart care, we have created designated Provider Care Teams.  These Care Teams include your primary Cardiologist (physician) and Advanced Practice Providers (APPs -  Physician Assistants and Nurse Practitioners) who all work together to provide you with the care you need, when you need it.  Your next appointment:   1 year(s)  The format for your next appointment:   In Person  Provider:   You may see Cristopher Peru, MD or one of the following Advanced Practice Providers on your designated Care Team:    Chanetta Marshall, NP  Tommye Standard, PA-C  Legrand Como "Gardena" [place], [place]

## 2020-12-20 LAB — CBC
Hematocrit: 43.8 % (ref 37.5–51.0)
Hemoglobin: 14.7 g/dL (ref 13.0–17.7)
MCH: 33.6 pg — ABNORMAL HIGH (ref 26.6–33.0)
MCHC: 33.6 g/dL (ref 31.5–35.7)
MCV: 100 fL — ABNORMAL HIGH (ref 79–97)
Platelets: 171 10*3/uL (ref 150–450)
RBC: 4.37 x10E6/uL (ref 4.14–5.80)
RDW: 12.3 % (ref 11.6–15.4)
WBC: 5.2 10*3/uL (ref 3.4–10.8)

## 2020-12-20 LAB — BASIC METABOLIC PANEL
BUN/Creatinine Ratio: 11 (ref 10–24)
BUN: 13 mg/dL (ref 8–27)
CO2: 29 mmol/L (ref 20–29)
Calcium: 10.1 mg/dL (ref 8.6–10.2)
Chloride: 98 mmol/L (ref 96–106)
Creatinine, Ser: 1.19 mg/dL (ref 0.76–1.27)
Glucose: 87 mg/dL (ref 65–99)
Potassium: 5.1 mmol/L (ref 3.5–5.2)
Sodium: 138 mmol/L (ref 134–144)
eGFR: 64 mL/min/{1.73_m2} (ref >59)

## 2020-12-20 LAB — DIGOXIN LEVEL: Digoxin, Serum: <0.4 ng/mL — ABNORMAL LOW (ref 0.5–0.9)

## 2020-12-31 NOTE — Progress Notes (Signed)
Remote pacemaker transmission.   

## 2021-01-15 ENCOUNTER — Ambulatory Visit (INDEPENDENT_AMBULATORY_CARE_PROVIDER_SITE_OTHER): Payer: POS | Admitting: Pharmacist

## 2021-01-15 ENCOUNTER — Other Ambulatory Visit: Payer: Self-pay

## 2021-01-15 DIAGNOSIS — Z7901 Long term (current) use of anticoagulants: Secondary | ICD-10-CM

## 2021-01-15 DIAGNOSIS — Z5181 Encounter for therapeutic drug level monitoring: Secondary | ICD-10-CM

## 2021-01-15 DIAGNOSIS — I4891 Unspecified atrial fibrillation: Secondary | ICD-10-CM

## 2021-01-15 LAB — POCT INR: INR: 1.2 — AB (ref 2.0–3.0)

## 2021-01-15 NOTE — Progress Notes (Signed)
Patient held warfarin for 5 days for skin cancer removal procedure.  Took an extra tablet this morning (4mg )

## 2021-01-15 NOTE — Patient Instructions (Signed)
Description   Take 3 tablets today and tomorrow and then continue on same dosage 2 tablets daily except 1 tablet on Wednesdays. Recheck INR in 1 week. Coumadin Clinic [phone].  Main # [phone]

## 2021-01-30 ENCOUNTER — Telehealth: Payer: Self-pay | Admitting: *Deleted

## 2021-01-30 DIAGNOSIS — I4891 Unspecified atrial fibrillation: Secondary | ICD-10-CM

## 2021-01-30 NOTE — Telephone Encounter (Signed)
Pt overdue to have INR checked. Pt called to make an appointment. Scheduled pt to come in on 7/14 to have INR checked.

## 2021-02-06 ENCOUNTER — Other Ambulatory Visit: Payer: Self-pay

## 2021-02-06 ENCOUNTER — Ambulatory Visit (INDEPENDENT_AMBULATORY_CARE_PROVIDER_SITE_OTHER): Payer: POS | Admitting: *Deleted

## 2021-02-06 DIAGNOSIS — Z5181 Encounter for therapeutic drug level monitoring: Secondary | ICD-10-CM

## 2021-02-06 DIAGNOSIS — I4891 Unspecified atrial fibrillation: Secondary | ICD-10-CM | POA: Diagnosis not present

## 2021-02-06 LAB — POCT INR: INR: 4.1 — AB (ref 2.0–3.0)

## 2021-02-06 NOTE — Patient Instructions (Addendum)
Description   Do not take any Warfarin tomorrow and take 1 tablet on Saturday then continue taking 2 tablets daily except 1 tablet on Wednesdays. DO NOT TAKE MORE THAN RECOMMENDED DOSE-CALL WITH ANY QUESTIONS. Recheck INR in 2 weeks. Coumadin Clinic [phone].  Main # [phone]

## 2021-02-20 ENCOUNTER — Other Ambulatory Visit: Payer: Self-pay

## 2021-02-20 ENCOUNTER — Ambulatory Visit (INDEPENDENT_AMBULATORY_CARE_PROVIDER_SITE_OTHER): Payer: POS

## 2021-02-20 DIAGNOSIS — Z950 Presence of cardiac pacemaker: Secondary | ICD-10-CM

## 2021-02-20 DIAGNOSIS — I4891 Unspecified atrial fibrillation: Secondary | ICD-10-CM | POA: Diagnosis not present

## 2021-02-20 LAB — POCT INR: INR: 2.2 (ref 2.0–3.0)

## 2021-02-20 NOTE — Patient Instructions (Signed)
Description   Continue taking 2 tablets daily except 1 tablet on Wednesdays. DO NOT TAKE MORE THAN RECOMMENDED DOSE-CALL WITH ANY QUESTIONS. Recheck INR in 4 weeks. Coumadin Clinic [phone].  Main # [phone]

## 2021-03-10 ENCOUNTER — Ambulatory Visit (INDEPENDENT_AMBULATORY_CARE_PROVIDER_SITE_OTHER): Payer: POS

## 2021-03-10 DIAGNOSIS — Z950 Presence of cardiac pacemaker: Secondary | ICD-10-CM | POA: Diagnosis not present

## 2021-03-12 LAB — CUP PACEART REMOTE DEVICE CHECK
Battery Remaining Longevity: 48 mo
Battery Remaining Percentage: 55 %
Brady Statistic RA Percent Paced: 10 %
Brady Statistic RV Percent Paced: 16 %
Date Time Interrogation Session: 20220816094000
Implantable Lead Implant Date: 20050517
Implantable Lead Implant Date: 20050517
Implantable Lead Location: 753859
Implantable Lead Location: 753860
Implantable Lead Model: 4086
Implantable Lead Model: 4087
Implantable Lead Serial Number: 218470
Implantable Lead Serial Number: 237198
Implantable Pulse Generator Implant Date: 20150112
Lead Channel Impedance Value: 451 Ohm
Lead Channel Impedance Value: 729 Ohm
Lead Channel Pacing Threshold Amplitude: 1.1 V
Lead Channel Pacing Threshold Pulse Width: 0.4 ms
Lead Channel Setting Pacing Amplitude: 2.4 V
Lead Channel Setting Pacing Amplitude: 2.4 V
Lead Channel Setting Pacing Pulse Width: 0.4 ms
Lead Channel Setting Sensing Sensitivity: 2.5 mV
Pulse Gen Serial Number: 389910

## 2021-03-20 ENCOUNTER — Other Ambulatory Visit: Payer: Self-pay

## 2021-03-20 ENCOUNTER — Ambulatory Visit (INDEPENDENT_AMBULATORY_CARE_PROVIDER_SITE_OTHER): Payer: POS | Admitting: *Deleted

## 2021-03-20 DIAGNOSIS — Z5181 Encounter for therapeutic drug level monitoring: Secondary | ICD-10-CM

## 2021-03-20 DIAGNOSIS — I4891 Unspecified atrial fibrillation: Secondary | ICD-10-CM

## 2021-03-20 LAB — POCT INR: INR: 2 (ref 2.0–3.0)

## 2021-03-20 NOTE — Patient Instructions (Signed)
Description   Continue taking 2 tablets daily except 1 tablet on Wednesdays. DO NOT TAKE MORE THAN RECOMMENDED DOSE-CALL WITH ANY QUESTIONS. Recheck INR in 5 weeks. Coumadin Clinic [phone].  Main # [phone]

## 2021-03-28 NOTE — Progress Notes (Signed)
Remote pacemaker transmission.   

## 2021-04-21 ENCOUNTER — Other Ambulatory Visit: Payer: Self-pay | Admitting: Internal Medicine

## 2021-04-24 ENCOUNTER — Other Ambulatory Visit: Payer: Self-pay

## 2021-04-24 ENCOUNTER — Ambulatory Visit (INDEPENDENT_AMBULATORY_CARE_PROVIDER_SITE_OTHER): Payer: POS

## 2021-04-24 DIAGNOSIS — I4891 Unspecified atrial fibrillation: Secondary | ICD-10-CM | POA: Diagnosis not present

## 2021-04-24 DIAGNOSIS — Z5181 Encounter for therapeutic drug level monitoring: Secondary | ICD-10-CM

## 2021-04-24 LAB — POCT INR: INR: 2.7 (ref 2.0–3.0)

## 2021-04-24 NOTE — Patient Instructions (Signed)
Description   Continue taking 2 tablets daily except 1 tablet on Wednesdays. DO NOT TAKE MORE THAN RECOMMENDED DOSE-CALL WITH ANY QUESTIONS. Recheck INR in 6 weeks. Coumadin Clinic [phone].  Main # [phone]

## 2021-05-26 ENCOUNTER — Telehealth: Payer: Self-pay

## 2021-05-26 NOTE — Telephone Encounter (Signed)
BSC alert Right ventricular pacing of > 50%. Pacing was 51% between May 25, 2021 00:29 and May 26, 2021 00:28.  VP 21% Presenting rhythm undersensing atrial channel Atrial Amplitude 0.24mv 3 NSVT, available EGM's show 7 & 8 beat runs  Spoke with pt, he repots he feels pretty good these days, denies any current symptoms.  Advised will have scheduling contact him to make appt with Oda Kilts or Dr. Lovena Le for preprogramming.

## 2021-06-05 ENCOUNTER — Ambulatory Visit (INDEPENDENT_AMBULATORY_CARE_PROVIDER_SITE_OTHER): Payer: POS

## 2021-06-05 ENCOUNTER — Other Ambulatory Visit: Payer: Self-pay

## 2021-06-05 DIAGNOSIS — I4891 Unspecified atrial fibrillation: Secondary | ICD-10-CM | POA: Diagnosis not present

## 2021-06-05 DIAGNOSIS — Z7901 Long term (current) use of anticoagulants: Secondary | ICD-10-CM | POA: Diagnosis not present

## 2021-06-05 DIAGNOSIS — Z5181 Encounter for therapeutic drug level monitoring: Secondary | ICD-10-CM

## 2021-06-05 LAB — POCT INR: INR: 3.3 — AB (ref 2.0–3.0)

## 2021-06-05 NOTE — Patient Instructions (Signed)
Description   Continue taking 2 tablets daily except 1 tablet on Wednesdays. DO NOT TAKE MORE THAN RECOMMENDED DOSE-CALL WITH ANY QUESTIONS. Recheck INR in 6 weeks. Coumadin Clinic [phone].  Main # [phone]

## 2021-06-06 ENCOUNTER — Encounter: Payer: Self-pay | Admitting: Student

## 2021-06-06 ENCOUNTER — Ambulatory Visit (INDEPENDENT_AMBULATORY_CARE_PROVIDER_SITE_OTHER): Payer: POS | Admitting: Student

## 2021-06-06 ENCOUNTER — Other Ambulatory Visit: Payer: Self-pay

## 2021-06-06 VITALS — BP 114/82 | HR 49 | Ht 64.0 in | Wt 151.6 lb

## 2021-06-06 DIAGNOSIS — I495 Sick sinus syndrome: Secondary | ICD-10-CM | POA: Diagnosis not present

## 2021-06-06 DIAGNOSIS — I4891 Unspecified atrial fibrillation: Secondary | ICD-10-CM

## 2021-06-06 DIAGNOSIS — Z950 Presence of cardiac pacemaker: Secondary | ICD-10-CM

## 2021-06-06 LAB — CUP PACEART INCLINIC DEVICE CHECK
Date Time Interrogation Session: 20221111100442
Implantable Lead Implant Date: 20050517
Implantable Lead Implant Date: 20050517
Implantable Lead Location: 753859
Implantable Lead Location: 753860
Implantable Lead Model: 4086
Implantable Lead Model: 4087
Implantable Lead Serial Number: 218470
Implantable Lead Serial Number: 237198
Implantable Pulse Generator Implant Date: 20150112
Lead Channel Impedance Value: 459 Ohm
Lead Channel Impedance Value: 689 Ohm
Lead Channel Pacing Threshold Amplitude: 1 V
Lead Channel Pacing Threshold Amplitude: 1.1 V
Lead Channel Pacing Threshold Pulse Width: 0.4 ms
Lead Channel Pacing Threshold Pulse Width: 0.4 ms
Lead Channel Setting Pacing Amplitude: 2.4 V
Lead Channel Setting Pacing Pulse Width: 0.4 ms
Lead Channel Setting Sensing Sensitivity: 2.5 mV
Pulse Gen Serial Number: 389910

## 2021-06-06 NOTE — Progress Notes (Signed)
Electrophysiology Office Note Date: 06/06/2021  ID:  Phillip Kline, DOB [birth year], MRN [id number]  PCP: [facility] Primary Cardiologist: Phillip Peru, MD Electrophysiologist: Phillip Peru, MD   CC: Pacemaker follow-up  Phillip Kline is a 75 y.o. male seen today for Phillip Peru, MD for routine electrophysiology followup. Since last being seen in our clinic, he reports doing well. He denies symptoms of palpitations, chest pain, shortness of breath, orthopnea, PND, lower extremity edema, claudication, dizziness, presyncope, syncope, bleeding, or neurologic sequela. The patient is tolerating medications without difficulties.    Device History: Engineer, agricultural PPM implanted 2005, gen change 2015 for SND  Past Medical History:  Diagnosis Date   A-fib ([place])    Abdominal hernia    Anxiety    Arthritis    Atrial fibrillation ([place])    Atrial flutter ([place])    Blood transfusion without reported diagnosis    Cataract    bilateral cataracts removed   Clotting disorder ([place])    coumadin - a fib   Coronary artery disease    Depression    Hyperlipidemia    Hypertension    Pacemaker    [employer]   Sinus bradycardia    Sleep apnea    wears a c-pap   Past Surgical History:  Procedure Laterality Date   CATARACT EXTRACTION     COLONOSCOPY     hernia repair     PACEMAKER INSERTION     PERMANENT PACEMAKER GENERATOR CHANGE N/A 08/07/2013   Procedure: PERMANENT PACEMAKER GENERATOR CHANGE;  Surgeon: Evans Lance, MD;  Location: [facility] CATH LAB;  Service: Cardiovascular;  Laterality: N/A;   PERMANENT PACEMAKER INSERTION  2005; 08/07/2013   BSX dual chamber pacemaker implanted 2005 for symptomatic bradycardia; gen change 2015 by Dr Lovena Le   REPLACEMENT TOTAL KNEE     right knee   thumb surgery     left and right   TONSILLECTOMY      Current Outpatient Medications  Medication Sig Dispense Refill   acetaminophen (TYLENOL) 325 MG tablet Take 2  tablets (650 mg total) by mouth every 6 (six) hours as needed for fever.     atorvastatin (LIPITOR) 80 MG tablet Take 40 mg by mouth at bedtime.      busPIRone (BUSPAR) 10 MG tablet TAKE ONE-HALF TABLET BY MOUTH IN THE MORNING AND EVENING FOR 7 DAYS, THEN TAKE ONE TABLET IN THE MORNING AND EVENING FOR IRRITABILITY     Calcium Carbonate-Vitamin D 500-125 MG-UNIT TABS Take 1 tablet by mouth 2 (two) times daily.     Carboxymethylcellulose Sodium 1 % GEL Apply to eye as needed.     digoxin (LANOXIN) 0.125 MG tablet TAKE 1 TABLET BY MOUTH DAILY. PLEASE KEEP NOVEMBER APPT TO GET MORE REFILLS. 90 tablet 3   hydrochlorothiazide (MICROZIDE) 12.5 MG capsule Take 12.5 mg by mouth daily.     LISINOPRIL PO Take by mouth.     metoprolol succinate (TOPROL XL) 25 MG 24 hr tablet Take 0.5 tablets (12.5 mg total) by mouth daily. 45 tablet 3   prednisoLONE acetate (PRED FORTE) 1 % ophthalmic suspension Place 1 drop into both eyes every 7 (seven) days.      QUEtiapine (SEROQUEL) 25 MG tablet Take 12.5 mg by mouth at bedtime.      sertraline (ZOLOFT) 100 MG tablet Take 200 mg by mouth daily.      sildenafil (VIAGRA) 50 MG tablet Take by mouth as needed.     thiamine  100 MG tablet Take 100 mg by mouth daily.     verapamil (CALAN-SR) 180 MG CR tablet Take 1 tablet by mouth daily.     vitamin B-12 (CYANOCOBALAMIN) 500 MCG tablet Take 500 mcg by mouth 2 (two) times daily.     warfarin (COUMADIN) 2 MG tablet TAKE 2 TABLETS BY MOUTH DAILY. EXCEPT 1 TABLET ON WEDNESDAYS OR AS DIRECTED BY COAGULATION CLINIC 60 tablet 3   No current facility-administered medications for this visit.    Allergies:   Patient has no known allergies.   Social History: Social History   Socioeconomic History   Marital status: Single    Spouse name: Not on file   Number of children: Not on file   Years of education: Not on file   Highest education level: Not on file  Occupational History   Not on file  Tobacco Use   Smoking status:  Never   Smokeless tobacco: Never   Tobacco comments:    Marijuana  Vaping Use   Vaping Use: Never used  Substance and Sexual Activity   Alcohol use: Yes    Comment: 4-5 beers weekly   Drug use: Not Currently    Types: Marijuana    Comment: smoked marijuana 09-26-16   Sexual activity: Yes    Partners: Female    Birth control/protection: None  Other Topics Concern   Not on file  Social History Narrative   ** Merged History Encounter **       Pt reports having a girlfriend but plans to break up with her   Social Determinants of Radio broadcast assistant Strain: Not on file  Food Insecurity: Not on file  Transportation Needs: Not on file  Physical Activity: Not on file  Stress: Not on file  Social Connections: Not on file  Intimate Partner Violence: Not on file    Family History: Family History  Problem Relation Age of Onset   Cancer Father        lung cancer   Stomach cancer Mother    Stomach cancer Sister    Colon cancer Neg Hx    Esophageal cancer Neg Hx    Pancreatic cancer Neg Hx    Prostate cancer Neg Hx    Rectal cancer Neg Hx      Review of Systems: All other systems reviewed and are otherwise negative except as noted above.  Physical Exam: Vitals:   06/06/21 0932  BP: 114/82  Pulse: (!) 49  SpO2: 99%  Weight: 151 lb 9.6 oz (68.8 kg)  Height: 5\' 4"  (1.626 m)     General:  Well appearing. No resp difficulty. HEENT: Normal Neck: Supple. JVP 5-6. Carotids 2+ bilat; no bruits. No thyromegaly or nodule noted. Cor: PMI nondisplaced. Irregularly irregular, No M/G/R noted Lungs: CTAB, normal effort. Abdomen: Soft, non-tender, non-distended, no HSM. No bruits or masses. +BS   Extremities: No cyanosis, clubbing, or rash. R and LLE no edema.  Neuro: Alert & orientedx3, cranial nerves grossly intact. moves all 4 extremities w/o difficulty. Affect pleasant   PPM Interrogation- reviewed in detail today,  See PACEART report  EKG:  EKG is not ordered  today.   Recent Labs: 12/19/2020: BUN 13; Creatinine, Ser 1.19; Hemoglobin 14.7; Platelets 171; Potassium 5.1; Sodium 138   Wt Readings from Last 3 Encounters:  06/06/21 151 lb 9.6 oz (68.8 kg)  12/19/20 149 lb (67.6 kg)  04/03/20 141 lb (64 kg)     Other studies Reviewed: Additional studies/ records that  were reviewed today include: Previous EP office notes, Previous remote checks, Most recent labwork.   Assessment and Plan:  1. SND s/p [employer] PPM  Normal PPM function, with some undersensing noted. See Pace Art report No changes today  2. Longstanding persistent atrial fibrillation Rates 90-100 mostly Continue Toprol 12.5 mg daily Labs to include dig level today.  Continue coumadin as below His AF is undersensed at highest sensitvity setting. Will make VVIR and follow closely for symptoms changes  3. Secondary Hypercoagulable State (ICD10:  D68.69) The patient is at high risk of stroke/thromboembolism with CHA2DS2VASC of 4    Coumadin per coumadin clinic. He has been cleared to hold coumadin 5 days prior to procedure, ideally resume the evening of the procedure, with close coumadin clinic follow up once his procedure has been scheduled.   4. HTN Stable on current regimen    Current medicines are reviewed at length with the patient today.   The patient does not have concerns regarding his medicines.  The following changes were made today:  none  Labs/ tests ordered today include:  Orders Placed This Encounter  Procedures   Basic metabolic panel   CBC   Digoxin level   CUP PACEART INCLINIC DEVICE CHECK     Disposition:   Follow up with Dr. Lovena Le in 6 months. If symptoms change based on programming adjustment he knows to call.   Jacalyn Lefevre, PA-C  06/06/2021 9:48 AM  [facility] [address] [place] [address] [phone] (office) [phone] (fax)

## 2021-06-06 NOTE — Patient Instructions (Signed)
Medication Instructions:  Your physician recommends that you continue on your current medications as directed. Please refer to the Current Medication list given to you today.  *If you need a refill on your cardiac medications before your next appointment, please call your pharmacy*   Lab Work: TODAY: BMET, CBC, Digoxin Level  If you have labs (blood work) drawn today and your tests are completely normal, you will receive your results only by: MyChart Message (if you have MyChart) OR A paper copy in the mail If you have any lab test that is abnormal or we need to change your treatment, we will call you to review the results.   Follow-Up: At [facility], you and your health needs are our priority.  As part of our continuing mission to provide you with exceptional heart care, we have created designated Provider Care Teams.  These Care Teams include your primary Cardiologist (physician) and Advanced Practice Providers (APPs -  Physician Assistants and Nurse Practitioners) who all work together to provide you with the care you need, when you need it.  We recommend signing up for the patient portal called "MyChart".  Sign up information is provided on this After Visit Summary.  MyChart is used to connect with patients for Virtual Visits (Telemedicine).  Patients are able to view lab/test results, encounter notes, upcoming appointments, etc.  Non-urgent messages can be sent to your provider as well.   To learn more about what you can do with MyChart, go to [email or web address].    Your next appointment:   6 month(s)  The format for your next appointment:   In Person  Provider:   Legrand Como "Oda Kilts, PA-C

## 2021-06-07 LAB — CBC
Hematocrit: 38.6 % (ref 37.5–51.0)
Hemoglobin: 12.3 g/dL — ABNORMAL LOW (ref 13.0–17.7)
MCH: 30.8 pg (ref 26.6–33.0)
MCHC: 31.9 g/dL (ref 31.5–35.7)
MCV: 97 fL (ref 79–97)
Platelets: 180 10*3/uL (ref 150–450)
RBC: 3.99 x10E6/uL — ABNORMAL LOW (ref 4.14–5.80)
RDW: 12.4 % (ref 11.6–15.4)
WBC: 5.8 10*3/uL (ref 3.4–10.8)

## 2021-06-07 LAB — BASIC METABOLIC PANEL
BUN/Creatinine Ratio: 17 (ref 10–24)
BUN: 21 mg/dL (ref 8–27)
CO2: 27 mmol/L (ref 20–29)
Calcium: 9.5 mg/dL (ref 8.6–10.2)
Chloride: 100 mmol/L (ref 96–106)
Creatinine, Ser: 1.21 mg/dL (ref 0.76–1.27)
Glucose: 90 mg/dL (ref 70–99)
Potassium: 4.5 mmol/L (ref 3.5–5.2)
Sodium: 138 mmol/L (ref 134–144)
eGFR: 62 mL/min/{1.73_m2} (ref >59)

## 2021-06-07 LAB — DIGOXIN LEVEL: Digoxin, Serum: <0.4 ng/mL — ABNORMAL LOW (ref 0.5–0.9)

## 2021-06-09 ENCOUNTER — Ambulatory Visit (INDEPENDENT_AMBULATORY_CARE_PROVIDER_SITE_OTHER): Payer: POS

## 2021-06-09 DIAGNOSIS — I495 Sick sinus syndrome: Secondary | ICD-10-CM | POA: Diagnosis not present

## 2021-06-11 LAB — CUP PACEART REMOTE DEVICE CHECK
Battery Remaining Longevity: 54 mo
Battery Remaining Percentage: 56 %
Brady Statistic RA Percent Paced: 0 %
Brady Statistic RV Percent Paced: 8 %
Date Time Interrogation Session: 20221116003200
Implantable Lead Implant Date: 20050517
Implantable Lead Implant Date: 20050517
Implantable Lead Location: 753859
Implantable Lead Location: 753860
Implantable Lead Model: 4086
Implantable Lead Model: 4087
Implantable Lead Serial Number: 218470
Implantable Lead Serial Number: 237198
Implantable Pulse Generator Implant Date: 20150112
Lead Channel Impedance Value: 460 Ohm
Lead Channel Impedance Value: 686 Ohm
Lead Channel Pacing Threshold Amplitude: 1.1 V
Lead Channel Pacing Threshold Pulse Width: 0.4 ms
Lead Channel Setting Pacing Amplitude: 2.4 V
Lead Channel Setting Pacing Pulse Width: 0.4 ms
Lead Channel Setting Sensing Sensitivity: 2.5 mV
Pulse Gen Serial Number: 389910

## 2021-06-16 NOTE — Progress Notes (Signed)
Remote pacemaker transmission.   

## 2021-07-17 ENCOUNTER — Other Ambulatory Visit: Payer: Self-pay

## 2021-07-17 ENCOUNTER — Ambulatory Visit (INDEPENDENT_AMBULATORY_CARE_PROVIDER_SITE_OTHER): Payer: POS | Admitting: *Deleted

## 2021-07-17 DIAGNOSIS — I4891 Unspecified atrial fibrillation: Secondary | ICD-10-CM | POA: Diagnosis not present

## 2021-07-17 DIAGNOSIS — Z5181 Encounter for therapeutic drug level monitoring: Secondary | ICD-10-CM

## 2021-07-17 LAB — POCT INR: INR: 2.3 (ref 2.0–3.0)

## 2021-07-17 NOTE — Patient Instructions (Signed)
Description   Continue taking 2 tablets daily except 1 tablet on Wednesdays. DO NOT TAKE MORE THAN RECOMMENDED DOSE-CALL WITH ANY QUESTIONS. Recheck INR in 6 weeks. Coumadin Clinic [phone].  Main # [phone]

## 2021-08-28 ENCOUNTER — Ambulatory Visit (INDEPENDENT_AMBULATORY_CARE_PROVIDER_SITE_OTHER): Payer: POS | Admitting: *Deleted

## 2021-08-28 ENCOUNTER — Other Ambulatory Visit: Payer: Self-pay

## 2021-08-28 DIAGNOSIS — I4891 Unspecified atrial fibrillation: Secondary | ICD-10-CM

## 2021-08-28 DIAGNOSIS — Z5181 Encounter for therapeutic drug level monitoring: Secondary | ICD-10-CM

## 2021-08-28 LAB — POCT INR: INR: 2.8 (ref 2.0–3.0)

## 2021-08-28 NOTE — Patient Instructions (Signed)
Description   Continue taking 2 tablets daily except 1 tablet on Wednesdays. DO NOT TAKE MORE THAN RECOMMENDED DOSE-CALL WITH ANY QUESTIONS. Recheck INR in 6 weeks. Coumadin Clinic [phone].  Main # [phone]

## 2021-09-08 ENCOUNTER — Ambulatory Visit (INDEPENDENT_AMBULATORY_CARE_PROVIDER_SITE_OTHER): Payer: POS

## 2021-09-08 DIAGNOSIS — I495 Sick sinus syndrome: Secondary | ICD-10-CM | POA: Diagnosis not present

## 2021-09-10 ENCOUNTER — Other Ambulatory Visit: Payer: Self-pay

## 2021-09-10 DIAGNOSIS — I4891 Unspecified atrial fibrillation: Secondary | ICD-10-CM

## 2021-09-10 LAB — CUP PACEART REMOTE DEVICE CHECK
Battery Remaining Longevity: 54 mo
Battery Remaining Percentage: 55 %
Brady Statistic RA Percent Paced: 0 %
Brady Statistic RV Percent Paced: 6 %
Date Time Interrogation Session: 20230214164000
Implantable Lead Implant Date: 20050517
Implantable Lead Implant Date: 20050517
Implantable Lead Location: 753859
Implantable Lead Location: 753860
Implantable Lead Model: 4086
Implantable Lead Model: 4087
Implantable Lead Serial Number: 218470
Implantable Lead Serial Number: 237198
Implantable Pulse Generator Implant Date: 20150112
Lead Channel Impedance Value: 454 Ohm
Lead Channel Impedance Value: 734 Ohm
Lead Channel Pacing Threshold Amplitude: 1.1 V
Lead Channel Pacing Threshold Pulse Width: 0.4 ms
Lead Channel Setting Pacing Amplitude: 2.4 V
Lead Channel Setting Pacing Pulse Width: 0.4 ms
Lead Channel Setting Sensing Sensitivity: 2.5 mV
Pulse Gen Serial Number: 389910

## 2021-09-10 MED ORDER — WARFARIN SODIUM 2 MG PO TABS: ORAL_TABLET | ORAL | 3 refills | Status: DC

## 2021-09-10 NOTE — Progress Notes (Signed)
Remote pacemaker transmission.   

## 2021-09-10 NOTE — Telephone Encounter (Signed)
Prescription refill request received for warfarin Lov: 05/27/21 [facility]ater)  Next INR check: 10/09/21 Warfarin tablet strength: 2mg   Appropriate dose and refill sent to requested pharmacy.

## 2021-10-09 ENCOUNTER — Other Ambulatory Visit: Payer: Self-pay

## 2021-10-09 ENCOUNTER — Ambulatory Visit (INDEPENDENT_AMBULATORY_CARE_PROVIDER_SITE_OTHER): Payer: POS | Admitting: *Deleted

## 2021-10-09 DIAGNOSIS — Z5181 Encounter for therapeutic drug level monitoring: Secondary | ICD-10-CM | POA: Diagnosis not present

## 2021-10-09 DIAGNOSIS — I4891 Unspecified atrial fibrillation: Secondary | ICD-10-CM | POA: Diagnosis not present

## 2021-10-09 LAB — POCT INR: INR: 5 — AB (ref 2.0–3.0)

## 2021-10-09 NOTE — Patient Instructions (Signed)
Description   ?Do not take any Warfarin today and do not take any Warfarin tomorrow then continue taking 2 tablets daily except 1 tablet on Wednesdays. DO NOT TAKE MORE THAN RECOMMENDED DOSE-CALL WITH ANY QUESTIONS. Recheck INR in 3 weeks (6 weeks). Coumadin Clinic [phone].  Main # [phone]  ?  ?  ?

## 2021-10-23 ENCOUNTER — Ambulatory Visit (INDEPENDENT_AMBULATORY_CARE_PROVIDER_SITE_OTHER): Payer: POS | Admitting: *Deleted

## 2021-10-23 DIAGNOSIS — Z5181 Encounter for therapeutic drug level monitoring: Secondary | ICD-10-CM

## 2021-10-23 DIAGNOSIS — I4891 Unspecified atrial fibrillation: Secondary | ICD-10-CM | POA: Diagnosis not present

## 2021-10-23 LAB — POCT INR: INR: 3 (ref 2.0–3.0)

## 2021-10-23 NOTE — Patient Instructions (Signed)
Description   ?Continue taking 2 tablets daily except 1 tablet on Wednesdays. DO NOT TAKE MORE THAN RECOMMENDED DOSE-CALL WITH ANY QUESTIONS. Recheck INR in 4 weeks (6 weeks). Coumadin Clinic [phone].  Main # [phone]  ?  ?  ?

## 2021-11-19 ENCOUNTER — Ambulatory Visit (INDEPENDENT_AMBULATORY_CARE_PROVIDER_SITE_OTHER): Payer: POS | Admitting: *Deleted

## 2021-11-19 DIAGNOSIS — Z7901 Long term (current) use of anticoagulants: Secondary | ICD-10-CM | POA: Diagnosis not present

## 2021-11-19 DIAGNOSIS — Z5181 Encounter for therapeutic drug level monitoring: Secondary | ICD-10-CM | POA: Diagnosis not present

## 2021-11-19 DIAGNOSIS — I4891 Unspecified atrial fibrillation: Secondary | ICD-10-CM | POA: Diagnosis not present

## 2021-11-19 LAB — POCT INR
INR: 3.8 — AB (ref 2.0–3.0)
INR: 3.8 — AB (ref 2.0–3.0)

## 2021-11-19 NOTE — Patient Instructions (Addendum)
Description   ?Do not take any Warfarin today then continue taking 2 tablets daily except 1 tablet on Wednesdays. DO NOT TAKE MORE THAN RECOMMENDED DOSE-CALL WITH ANY QUESTIONS. Recheck INR in 3 weeks (normally 6 weeks). Coumadin Clinic [phone].  Main # [phone]  ?  ?  ? ?

## 2021-12-08 ENCOUNTER — Ambulatory Visit (INDEPENDENT_AMBULATORY_CARE_PROVIDER_SITE_OTHER): Payer: POS

## 2021-12-08 DIAGNOSIS — I495 Sick sinus syndrome: Secondary | ICD-10-CM | POA: Diagnosis not present

## 2021-12-09 LAB — CUP PACEART REMOTE DEVICE CHECK
Battery Remaining Longevity: 60 mo
Battery Remaining Percentage: 62 %
Brady Statistic RA Percent Paced: 0 %
Brady Statistic RV Percent Paced: 13 %
Date Time Interrogation Session: 20230515003000
Implantable Lead Implant Date: 20050517
Implantable Lead Implant Date: 20050517
Implantable Lead Location: 753859
Implantable Lead Location: 753860
Implantable Lead Model: 4086
Implantable Lead Model: 4087
Implantable Lead Serial Number: 218470
Implantable Lead Serial Number: 237198
Implantable Pulse Generator Implant Date: 20150112
Lead Channel Impedance Value: 431 Ohm
Lead Channel Impedance Value: 695 Ohm
Lead Channel Pacing Threshold Amplitude: 1.1 V
Lead Channel Pacing Threshold Pulse Width: 0.4 ms
Lead Channel Setting Pacing Amplitude: 2.4 V
Lead Channel Setting Pacing Pulse Width: 0.4 ms
Lead Channel Setting Sensing Sensitivity: 2.5 mV
Pulse Gen Serial Number: 389910

## 2021-12-11 ENCOUNTER — Ambulatory Visit (INDEPENDENT_AMBULATORY_CARE_PROVIDER_SITE_OTHER): Payer: POS | Admitting: *Deleted

## 2021-12-11 DIAGNOSIS — Z5181 Encounter for therapeutic drug level monitoring: Secondary | ICD-10-CM | POA: Diagnosis not present

## 2021-12-11 DIAGNOSIS — Z7901 Long term (current) use of anticoagulants: Secondary | ICD-10-CM

## 2021-12-11 DIAGNOSIS — I4891 Unspecified atrial fibrillation: Secondary | ICD-10-CM | POA: Diagnosis not present

## 2021-12-11 LAB — POCT INR: INR: 4.5 — AB (ref 2.0–3.0)

## 2021-12-11 NOTE — Patient Instructions (Signed)
Description   Since you have already today's dose, Do not take any Warfarin tomorrow and No Warfarin Saturday then start taking 2 tablets daily except 1 tablet on Sundays and Wednesdays. DO NOT TAKE MORE THAN RECOMMENDED DOSE-CALL WITH ANY QUESTIONS. Recheck INR in 3 weeks (normally 6 weeks). Coumadin Clinic [phone].  Main # [phone]

## 2021-12-25 ENCOUNTER — Ambulatory Visit (INDEPENDENT_AMBULATORY_CARE_PROVIDER_SITE_OTHER): Payer: POS

## 2021-12-25 DIAGNOSIS — Z5181 Encounter for therapeutic drug level monitoring: Secondary | ICD-10-CM

## 2021-12-25 DIAGNOSIS — I4891 Unspecified atrial fibrillation: Secondary | ICD-10-CM | POA: Diagnosis not present

## 2021-12-25 LAB — POCT INR: INR: 3.1 — AB (ref 2.0–3.0)

## 2021-12-25 NOTE — Patient Instructions (Signed)
Description   Continue taking 2 tablets daily except 1 tablet on Sundays and Wednesdays.  DO NOT TAKE MORE THAN RECOMMENDED DOSE-CALL WITH ANY QUESTIONS. Recheck INR in 3 weeks (normally 6 weeks).  Coumadin Clinic [phone]. Main # [phone]

## 2021-12-25 NOTE — Progress Notes (Signed)
Remote pacemaker transmission.   

## 2022-01-14 ENCOUNTER — Ambulatory Visit (INDEPENDENT_AMBULATORY_CARE_PROVIDER_SITE_OTHER): Payer: POS | Admitting: *Deleted

## 2022-01-14 DIAGNOSIS — Z7901 Long term (current) use of anticoagulants: Secondary | ICD-10-CM

## 2022-01-14 DIAGNOSIS — Z5181 Encounter for therapeutic drug level monitoring: Secondary | ICD-10-CM

## 2022-01-14 DIAGNOSIS — I4891 Unspecified atrial fibrillation: Secondary | ICD-10-CM

## 2022-01-14 LAB — POCT INR: INR: 3 (ref 2.0–3.0)

## 2022-01-14 NOTE — Patient Instructions (Signed)
Description   Continue taking 2 tablets daily except 1 tablet on Sundays and Wednesdays.  DO NOT TAKE MORE THAN RECOMMENDED DOSE-CALL WITH ANY QUESTIONS. Recheck INR in 4 weeks (normally 6 weeks). Coumadin Clinic [phone]. Main # [phone]

## 2022-02-03 ENCOUNTER — Other Ambulatory Visit: Payer: Self-pay

## 2022-02-03 ENCOUNTER — Other Ambulatory Visit: Payer: Self-pay | Admitting: Internal Medicine

## 2022-02-03 DIAGNOSIS — I4891 Unspecified atrial fibrillation: Secondary | ICD-10-CM

## 2022-02-03 NOTE — Telephone Encounter (Signed)
Warfarin refill was sent to pt's pharmacy [facility] on [address],  receipt confirmed by pharmacy at 1:46pm.  Pt should call his pharmacy to check on status of refill.  Called pt to notify, pt also wanted to change his upcoming appt on 7/18, pt r/s to 7/21.

## 2022-02-13 ENCOUNTER — Ambulatory Visit (INDEPENDENT_AMBULATORY_CARE_PROVIDER_SITE_OTHER): Payer: POS | Admitting: *Deleted

## 2022-02-13 DIAGNOSIS — I4891 Unspecified atrial fibrillation: Secondary | ICD-10-CM

## 2022-02-13 DIAGNOSIS — Z5181 Encounter for therapeutic drug level monitoring: Secondary | ICD-10-CM | POA: Diagnosis not present

## 2022-02-13 LAB — POCT INR: INR: 1.7 — AB (ref 2.0–3.0)

## 2022-02-13 NOTE — Patient Instructions (Signed)
Description   Resume taking the dose you should be taking, which is, 2 tablets daily except 1 tablet on Sundays and Wednesdays. DO NOT TAKE MORE THAN RECOMMENDED DOSE-CALL WITH ANY QUESTIONS. Recheck INR in 3 weeks (normally 6 weeks). Cou[facility]in Clinic [phone]. Main # [phone]

## 2022-03-09 ENCOUNTER — Ambulatory Visit (INDEPENDENT_AMBULATORY_CARE_PROVIDER_SITE_OTHER): Payer: POS

## 2022-03-09 DIAGNOSIS — I495 Sick sinus syndrome: Secondary | ICD-10-CM | POA: Diagnosis not present

## 2022-03-10 LAB — CUP PACEART REMOTE DEVICE CHECK
Battery Remaining Longevity: 54 mo
Battery Remaining Percentage: 57 %
Brady Statistic RA Percent Paced: 0 %
Brady Statistic RV Percent Paced: 10 %
Date Time Interrogation Session: 20230815111300
Implantable Lead Implant Date: 20050517
Implantable Lead Implant Date: 20050517
Implantable Lead Location: 753859
Implantable Lead Location: 753860
Implantable Lead Model: 4086
Implantable Lead Model: 4087
Implantable Lead Serial Number: 218470
Implantable Lead Serial Number: 237198
Implantable Pulse Generator Implant Date: 20150112
Lead Channel Impedance Value: 464 Ohm
Lead Channel Impedance Value: 732 Ohm
Lead Channel Pacing Threshold Amplitude: 1.1 V
Lead Channel Pacing Threshold Pulse Width: 0.4 ms
Lead Channel Setting Pacing Amplitude: 2.4 V
Lead Channel Setting Pacing Pulse Width: 0.4 ms
Lead Channel Setting Sensing Sensitivity: 2.5 mV
Pulse Gen Serial Number: 389910

## 2022-03-11 ENCOUNTER — Ambulatory Visit (INDEPENDENT_AMBULATORY_CARE_PROVIDER_SITE_OTHER): Payer: POS | Admitting: *Deleted

## 2022-03-11 DIAGNOSIS — Z5181 Encounter for therapeutic drug level monitoring: Secondary | ICD-10-CM | POA: Diagnosis not present

## 2022-03-11 DIAGNOSIS — I4891 Unspecified atrial fibrillation: Secondary | ICD-10-CM | POA: Diagnosis not present

## 2022-03-11 LAB — POCT INR: INR: 3.3 — AB (ref 2.0–3.0)

## 2022-03-11 NOTE — Patient Instructions (Signed)
Description   Hold tomorrow's dose then continue taking 2 tablets daily except 1 tablet on Sundays and Wednesdays. DO NOT TAKE MORE THAN RECOMMENDED DOSE-CALL WITH ANY QUESTIONS. Recheck INR in 4 weeks (normally 6 weeks). [facility]in Clinic [phone]. Main # [phone]

## 2022-04-08 ENCOUNTER — Ambulatory Visit: Payer: POS | Attending: Cardiovascular Disease

## 2022-04-08 DIAGNOSIS — I4891 Unspecified atrial fibrillation: Secondary | ICD-10-CM | POA: Diagnosis not present

## 2022-04-08 DIAGNOSIS — Z5181 Encounter for therapeutic drug level monitoring: Secondary | ICD-10-CM | POA: Diagnosis not present

## 2022-04-08 LAB — POCT INR: INR: 2.9 (ref 2.0–3.0)

## 2022-04-08 NOTE — Patient Instructions (Signed)
Description   Continue taking 2 tablets daily except 1 tablet on Sundays and Wednesdays.  DO NOT TAKE MORE THAN RECOMMENDED DOSE-CALL WITH ANY QUESTIONS.  Recheck INR in 5 weeks (normally 6 weeks). [facility] Clinic [phone]. Main # [phone]

## 2022-04-09 NOTE — Progress Notes (Signed)
Remote pacemaker transmission.   

## 2022-05-13 ENCOUNTER — Ambulatory Visit: Payer: POS | Attending: Internal Medicine

## 2022-05-14 ENCOUNTER — Encounter: Payer: Self-pay | Admitting: Internal Medicine

## 2022-05-14 ENCOUNTER — Ambulatory Visit: Payer: POS | Attending: Internal Medicine | Admitting: Internal Medicine

## 2022-05-14 VITALS — BP 94/56 | HR 67 | Ht 64.0 in | Wt 144.6 lb

## 2022-05-14 DIAGNOSIS — I495 Sick sinus syndrome: Secondary | ICD-10-CM | POA: Diagnosis not present

## 2022-05-14 DIAGNOSIS — I4891 Unspecified atrial fibrillation: Secondary | ICD-10-CM | POA: Diagnosis not present

## 2022-05-14 DIAGNOSIS — Z950 Presence of cardiac pacemaker: Secondary | ICD-10-CM

## 2022-05-14 LAB — CUP PACEART INCLINIC DEVICE CHECK
Date Time Interrogation Session: 20231019163653
Implantable Lead Implant Date: 20050517
Implantable Lead Implant Date: 20050517
Implantable Lead Location: 753859
Implantable Lead Location: 753860
Implantable Lead Model: 4086
Implantable Lead Model: 4087
Implantable Lead Serial Number: 218470
Implantable Lead Serial Number: 237198
Implantable Pulse Generator Implant Date: 20150112
Lead Channel Impedance Value: 474 Ohm
Lead Channel Impedance Value: 690 Ohm
Lead Channel Pacing Threshold Amplitude: 1 V
Lead Channel Pacing Threshold Amplitude: 1.1 V
Lead Channel Pacing Threshold Pulse Width: 0.4 ms
Lead Channel Pacing Threshold Pulse Width: 0.4 ms
Lead Channel Sensing Intrinsic Amplitude: 8.8 mV
Lead Channel Setting Pacing Amplitude: 2.4 V
Lead Channel Setting Pacing Pulse Width: 0.4 ms
Lead Channel Setting Sensing Sensitivity: 2.5 mV
Pulse Gen Serial Number: 389910

## 2022-05-14 NOTE — Patient Instructions (Signed)
Medication Instructions:  Your physician recommends that you continue on your current medications as directed. Please refer to the Current Medication list given to you today.  *If you need a refill on your cardiac medications before your next appointment, please call your pharmacy*  Lab Work: None ordered.  If you have labs (blood work) drawn today and your tests are completely normal, you will receive your results only by: [place] (if you have MyChart) OR A paper copy in the mail If you have any lab test that is abnormal or we need to change your treatment, we will call you to review the results.  Testing/Procedures: None ordered.  Follow-Up: At [facility], you and your health needs are our priority.  As part of our continuing mission to provide you with exceptional heart care, we have created designated Provider Care Teams.  These Care Teams include your primary Cardiologist (physician) and Advanced Practice Providers (APPs -  Physician Assistants and Nurse Practitioners) who all work together to provide you with the care you need, when you need it.  We recommend signing up for the patient portal called "MyChart".  Sign up information is provided on this After Visit Summary.  MyChart is used to connect with patients for Virtual Visits (Telemedicine).  Patients are able to view lab/test results, encounter notes, upcoming appointments, etc.  Non-urgent messages can be sent to your provider as well.   To learn more about what you can do with MyChart, go to [email or web address].    Your next appointment:   1 year(s)  The format for your next appointment:   In Person  Provider:   Cristopher Peru, MD{or one of the following Advanced Practice Providers on your designated Care Team:   Tommye Standard, [place] Legrand Como "Jonni Sanger" Chalmers Cater, [place]  Remote monitoring is used to monitor your Pacemaker/ ICD from home. This monitoring reduces the number of office visits required to check your  device to one time per year. It allows [place] to keep an eye on the functioning of your device to ensure it is working properly. You are scheduled for a device check from home on 06/08/2022. You may send your transmission at any time that day. If you have a wireless device, the transmission will be sent automatically. After your physician reviews your transmission, you will receive a postcard with your next transmission date.  Important Information About Sugar

## 2022-05-14 NOTE — Progress Notes (Signed)
HPI Phillip Kline returns today for followup. The patient is a pleasant 76 yo man with PAF, HTN, and CAD. He has sinus node dysfunction and is s/p PPM insertion. The patient has felt well in the interim with no chest pain or sob. No syncope. He has been exercising most days of the week including yoga. He denies palpitations. No Known Allergies   Current Outpatient Medications  Medication Sig Dispense Refill   acetaminophen (TYLENOL) 325 MG tablet Take 2 tablets (650 mg total) by mouth every 6 (six) hours as needed for fever. (Patient taking differently: Take 500 mg by mouth every 6 (six) hours as needed for fever.)     atorvastatin (LIPITOR) 80 MG tablet Take 40 mg by mouth at bedtime.      Calcium Carbonate-Vitamin D 500-125 MG-UNIT TABS Take 1 tablet by mouth 2 (two) times daily.     digoxin (LANOXIN) 0.125 MG tablet TAKE 1 TABLET BY MOUTH DAILY. PLEASE KEEP NOVEMBER APPT TO GET MORE REFILLS. 90 tablet 3   hydrochlorothiazide (MICROZIDE) 12.5 MG capsule Take 12.5 mg by mouth daily.     LISINOPRIL PO Take by mouth.     QUEtiapine (SEROQUEL) 25 MG tablet Take 12.5 mg by mouth at bedtime.      sertraline (ZOLOFT) 100 MG tablet Take 200 mg by mouth daily.      sildenafil (VIAGRA) 50 MG tablet Take by mouth as needed.     thiamine 100 MG tablet Take 100 mg by mouth daily.     verapamil (CALAN-SR) 180 MG CR tablet Take 1 tablet by mouth daily.     vitamin B-12 (CYANOCOBALAMIN) 500 MCG tablet Take 500 mcg by mouth 2 (two) times daily.     warfarin (COUMADIN) 2 MG tablet TAKE 2 TABLETS BY MOUTH DAILY. EXCEPT 1 TABLET ON SUNDAYS AND WEDNESDAYS OR AS DIRECTED BY COUMADIN CLINIC 60 tablet 3   No current facility-administered medications for this visit.     Past Medical History:  Diagnosis Date   A-fib ([place])    Abdominal hernia    Anxiety    Arthritis    Atrial fibrillation (H[facility])    Atrial flutter (H[facility])    Blood transfusion without reported diagnosis    Cataract    bilateral cataracts  removed   Clotting disorder ([place])    coumadin - a fib   Coronary artery disease    Depression    Hyperlipidemia    Hypertension    Pacemaker    [employer]   Sinus bradycardia    Sleep apnea    wears a c-pap    ROS:   All systems reviewed and negative except as noted in the HPI.   Past Surgical History:  Procedure Laterality Date   CATARACT EXTRACTION     COLONOSCOPY     hernia repair     PACEMAKER INSERTION     PERMANENT PACEMAKER GENERATOR CHANGE N/A 08/07/2013   Procedure: PERMANENT PACEMAKER GENERATOR CHANGE;  Surgeon: Evans Lance, MD;  Location: [facility] CATH LAB;  Service: Cardiovascular;  Laterality: N/A;   PERMANENT PACEMAKER INSERTION  2005; 08/07/2013   BSX dual chamber pacemaker implanted 2005 for symptomatic bradycardia; gen change 2015 by Dr Lovena Le   REPLACEMENT TOTAL KNEE     right knee   thumb surgery     left and right   TONSILLECTOMY       Family History  Problem Relation Age of Onset   Cancer Father  lung cancer   Stomach cancer Mother    Stomach cancer Sister    Colon cancer Neg Hx    Esophageal cancer Neg Hx    Pancreatic cancer Neg Hx    Prostate cancer Neg Hx    Rectal cancer Neg Hx      Social History   Socioeconomic History   Marital status: Single    Spouse name: Not on file   Number of children: Not on file   Years of education: Not on file   Highest education level: Not on file  Occupational History   Not on file  Tobacco Use   Smoking status: Never   Smokeless tobacco: Never   Tobacco comments:    Marijuana  Vaping Use   Vaping Use: Never used  Substance and Sexual Activity   Alcohol use: Yes    Comment: 4-5 beers weekly   Drug use: Not Currently    Types: Marijuana    Comment: smoked marijuana 09-26-16   Sexual activity: Yes    Partners: Female    Birth control/protection: None  Other Topics Concern   Not on file  Social History Narrative   ** Merged History Encounter **       Pt reports having a  girlfriend but plans to break up with her   Social Determinants of Health   Financial Resource Strain: Low Risk  (03/31/2018)   Overall Financial Resource Strain (CARDIA)    Difficulty of Paying Living Expenses: Not hard at all  Food Insecurity: No Food Insecurity (03/31/2018)   Hunger Vital Sign    Worried About Running Out of Food in the Last Year: Never true    Ran Out of Food in the Last Year: Never true  Transportation Needs: No Transportation Needs (03/31/2018)   PRAPARE - Hydrologist (Medical): No    Lack of Transportation (Non-Medical): No  Physical Activity: Inactive (03/31/2018)   Exercise Vital Sign    Days of Exercise per Week: 0 days    Minutes of Exercise per Session: 0 min  Stress: Stress Concern Present (03/31/2018)   [place]    Feeling of Stress : To some extent  Social Connections: Moderately Integrated (03/31/2018)   Social Connection and Isolation Panel [NHANES]    Frequency of Communication with Friends and Family: More than three times a week    Frequency of Social Gatherings with Friends and Family: Once a week    Attends Religious Services: More than 4 times per year    Active Member of [employer] or Organizations: Yes    Attends Archivist Meetings: 1 to 4 times per year    Marital Status: Never married  Intimate Partner Violence: Not At Risk (03/31/2018)   Humiliation, Afraid, Rape, and Kick questionnaire    Fear of Current or Ex-Partner: No    Emotionally Abused: No    Physically Abused: No    Sexually Abused: No     BP (!) 94/56   Pulse 67   Ht '5\' 4"'$  (1.626 m)   Wt 144 lb 9.6 oz (65.6 kg)   SpO2 97%   BMI 24.82 kg/m   Physical Exam:  Well appearing NAD HEENT: Unremarkable Neck:  No JVD, no thyromegally Lymphatics:  No adenopathy Back:  No CVA tenderness Lungs:  Clear HEART:  Regular rate rhythm, no murmurs, no rubs, no clicks Abd:  soft, positive  bowel sounds, no organomegally, no rebound, no  guarding Ext:  2 plus pulses, no edema, no cyanosis, no clubbing Skin:  No rashes no nodules Neuro:  CN II through XII intact, motor grossly intact  EKG  DEVICE  Normal device function.  See PaceArt for details.   Assess/Plan:  1. Atrial fib/flutter - his VR is mostly well controlled. He will continue his current meds. 2. Coags - we discussed continuing Warfarin vs switching to an [place]. For now he would like to continue his warfarin.  3. PPM - his [employer] DDD PM is working normally.  4. HTN - his BP is well controlled. I encouraged him to continue his current meds, avoid salty foods and continue to exercise.   [email or web address] [email or web address]

## 2022-06-01 ENCOUNTER — Other Ambulatory Visit: Payer: Self-pay

## 2022-06-01 DIAGNOSIS — I4891 Unspecified atrial fibrillation: Secondary | ICD-10-CM

## 2022-06-01 MED ORDER — WARFARIN SODIUM 2 MG PO TABS: ORAL_TABLET | ORAL | 2 refills | Status: DC

## 2022-06-01 NOTE — Telephone Encounter (Signed)
Prescription refill request received for warfarin Lov: 05/14/22 Next INR check: 05/13/22 Warfarin tablet strength: '2mg'$   Appropriate dose and refill sent to sent requested pharmacy.

## 2022-06-02 ENCOUNTER — Telehealth: Payer: Self-pay | Admitting: Internal Medicine

## 2022-06-02 ENCOUNTER — Other Ambulatory Visit: Payer: Self-pay

## 2022-06-02 DIAGNOSIS — I4891 Unspecified atrial fibrillation: Secondary | ICD-10-CM

## 2022-06-02 MED ORDER — WARFARIN SODIUM 2 MG PO TABS: ORAL_TABLET | ORAL | 2 refills | Status: DC

## 2022-06-02 NOTE — Telephone Encounter (Signed)
*  STAT* If patient is at the pharmacy, call can be transferred to refill team.   1. Which medications need to be refilled? (please list name of each medication and dose if known) warfarin (COUMADIN) 2 MG tablet   2. Which pharmacy/location (including street and city if local pharmacy) is medication to be sent to? [facility] [phone] - [place], [place] - [place]   3. Do they need a 30 day or 90 day supply? 90 day   Patient is completely out of medication

## 2022-06-08 ENCOUNTER — Ambulatory Visit (INDEPENDENT_AMBULATORY_CARE_PROVIDER_SITE_OTHER): Payer: POS

## 2022-06-08 DIAGNOSIS — I495 Sick sinus syndrome: Secondary | ICD-10-CM | POA: Diagnosis not present

## 2022-06-08 LAB — CUP PACEART REMOTE DEVICE CHECK
Battery Remaining Longevity: 48 mo
Battery Remaining Percentage: 53 %
Brady Statistic RA Percent Paced: 0 %
Brady Statistic RV Percent Paced: 17 %
Date Time Interrogation Session: 20231113003100
Implantable Lead Connection Status: 753985
Implantable Lead Connection Status: 753985
Implantable Lead Implant Date: 20050517
Implantable Lead Implant Date: 20050517
Implantable Lead Location: 753859
Implantable Lead Location: 753860
Implantable Lead Model: 4086
Implantable Lead Model: 4087
Implantable Lead Serial Number: 218470
Implantable Lead Serial Number: 237198
Implantable Pulse Generator Implant Date: 20150112
Lead Channel Impedance Value: 441 Ohm
Lead Channel Impedance Value: 610 Ohm
Lead Channel Pacing Threshold Amplitude: 1.1 V
Lead Channel Pacing Threshold Pulse Width: 0.4 ms
Lead Channel Setting Pacing Amplitude: 2.4 V
Lead Channel Setting Pacing Pulse Width: 0.4 ms
Lead Channel Setting Sensing Sensitivity: 2.5 mV
Pulse Gen Serial Number: 389910
Zone Setting Status: 755011

## 2022-07-09 NOTE — Progress Notes (Signed)
Remote pacemaker transmission.   

## 2022-08-25 NOTE — Progress Notes (Unsigned)
Cardiology Office Note Date:  07/29/2017  Patient ID:  Kline, Phillip November 23, 1945, MRN [id number] PCP:  Henreitta Cea, MD  Cardiologist:  Dr. Lovena Le   Chief Complaint:  needs gen change/device advisory  History of Present Illness: Phillip Kline is a 77 y.o. male with history of tachy-brady syndrome w/PPM, AFib, HTN, HLD, OSA w/CPAP.   CAD is mentioned in his problem in his PMHx though I do not find clear evidence of this.  He last saw Dr. Lovena Le 05/14/22, was participating in yoga, feeling well.  Discribed rate mostly controlled, no changes to meds.  Pt preferred to stay on warfarin rather then switching to [facility]  In d/w industry, recommends gen change once battery reaches <4 years can go into a "safety mode (unipolar 72bpm) Recommends do not interrogate device, place wand which could also provoke safety mode.  TODAY He feels well No CP, palpitations or cardiac awareness No SOB/DOE Remains active No near syncope or syncope. No bleeding or signs of bleeding  Device information: BSCi dual chamber PPM, implanted, 2005, gen change 08/07/13  Past Medical History:  Diagnosis Date   Abdominal hernia    Anxiety    Arthritis    Atrial fibrillation ([place])    Atrial flutter ([place])    Blood transfusion without reported diagnosis    Cataract    bilateral cataracts removed   Clotting disorder ([place])    coumadin - a fib   Coronary artery disease    Depression    Hyperlipidemia    Hypertension    Pacemaker    [employer]   Sinus bradycardia    Sleep apnea    wears a c-pap    Past Surgical History:  Procedure Laterality Date   CATARACT EXTRACTION     COLONOSCOPY     hernia repair     PERMANENT PACEMAKER GENERATOR CHANGE N/A 08/07/2013   Procedure: PERMANENT PACEMAKER GENERATOR CHANGE;  Surgeon: Evans Lance, MD;  Location: [facility] CATH LAB;  Service: Cardiovascular;  Laterality: N/A;   PERMANENT PACEMAKER INSERTION  2005; 08/07/2013   BSX dual chamber pacemaker  implanted 2005 for symptomatic bradycardia; gen change 2015 by Dr Lovena Le   REPLACEMENT TOTAL KNEE     right knee   thumb surgery     left and right   TONSILLECTOMY      Current Outpatient Medications  Medication Sig Dispense Refill   atorvastatin (LIPITOR) 80 MG tablet Take 40 mg by mouth at bedtime.      Calcium Carbonate-Vitamin D (CALCIUM 500/D) 500-125 MG-UNIT TABS Take 1 tablet by mouth 2 (two) times daily.      digoxin (DIGITEK) 0.125 MG tablet Take 1 tablet (125 mcg total) by mouth daily. 90 tablet 2   hydrochlorothiazide 25 MG tablet Take 12.5 mg by mouth daily.      lisinopril (PRINIVIL,ZESTRIL) 2.5 MG tablet Take 2.5 mg by mouth daily.     prednisoLONE acetate (PREDNISOLONE ACETATE P-F) 1 % ophthalmic suspension Place 1 drop into both eyes 4 (four) times daily.     QUEtiapine (SEROQUEL) 25 MG tablet Take 25 mg by mouth at bedtime. Takes 1/2 tablet QHS     sertraline (ZOLOFT) 100 MG tablet Take 100 mg by mouth daily. Taking 1.5 tablets everyday     sildenafil (VIAGRA) 25 MG tablet Take 12.5 mg by mouth daily as needed for erectile dysfunction.     thiamine 100 MG tablet Take 100 mg by mouth daily.     verapamil (CALAN-SR) 240  MG CR tablet Take 1 tablet (240 mg total) by mouth at bedtime. 90 tablet 3   vitamin B-12 (CYANOCOBALAMIN) 500 MCG tablet Take 500 mcg by mouth 2 (two) times daily.     warfarin (COUMADIN) 2 MG tablet Take as directed by coumadin clinic 50 tablet 3   Current Facility-Administered Medications  Medication Dose Route Frequency Provider Last Rate Last Dose   0.9 %  sodium chloride infusion  500 mL Intravenous Continuous Ladene Artist, MD       0.9 %  sodium chloride infusion  500 mL Intravenous Continuous Ladene Artist, MD        Allergies:   Patient has no known allergies.   Social History:  The patient  reports that he has been smoking e-cigarettes.  he has never used smokeless tobacco. He reports that he drinks alcohol. He reports that he uses  drugs. Drug: Marijuana.   Family History:  The patient's family history includes Cancer in his father; Stomach cancer in his mother and sister.  ROS:  Please see the history of present illness.  All other systems are reviewed and otherwise negative.   PHYSICAL EXAM:  VS:  BP 136/84   Pulse (!) 112   Ht '5\' 4"'$  (1.626 m)   Wt 146 lb (66.2 kg)   SpO2 96%   BMI 25.06 kg/m  BMI: Body mass index is 25.06 kg/m. Well nourished, well developed, in no acute distress  HEENT: normocephalic, atraumatic  Neck: no JVD, carotid bruits or masses Cardiac:   RRR; 2/6 SM,  no rubs, or gallops Lungs: CTA b/l, no wheezing, rhonchi or rales  Abd: soft, nontender MS: no deformity, age appropriate atrophy Ext: no edema, chronic looking skin changes  Skin: warm and dry, no rash Neuro:  No gross deficits appreciated Psych: euthymic mood, full affect  PPM site is stable, no tethering or discomfort   EKG:  Done today and reviewed by myself   AFlutter 63bpm   PPM Interrogation  Not done at industry recommendation with last known battery status at or below 4 years, and current advisory   12/01/16: Exercise stress test Study Highlights  The patient walked for 3:29 of a Bruce protocol GXT. Peak HR of 117 which is 78% predicted maximal HR. The patient is in atrial fib and is V paced. ST segments were not evaluated. There was a controlled / gradual increase in HR with exercise    Stress Findings  Stress Findings The patient exercised following the Bruce protocol.  The patient reported no symptoms during the stress test. The patient experienced no angina during the stress test.   The patient requested the test to be stopped.   Blood pressure and heart rate demonstrated a normal response to exercise. Blood pressure demonstrated a normal response to exercise. Overall, the patient's exercise capacity was mildly impaired.   Recovery time: 5 minutes. The patient's response to exercise was adequate for  diagnosis.     Recent Labs: No results found for requested labs within last 8760 hours.  No results found for requested labs within last 8760 hours.   CrCl cannot be calculated (Patient's most recent lab result is older than the maximum 21 days allowed.).   Wt Readings from Last 3 Encounters:  07/29/17 146 lb (66.2 kg)  11/17/16 160 lb 12.8 oz (72.9 kg)  09/29/16 167 lb (75.8 kg)     Other studies reviewed: Additional studies/records reviewed today include: summarized above  ASSESSMENT AND PLAN:  1. PPM  Device on advisory d/w the patient     Gen change procedure risks/benefits discussed he is agreeable  Last remote in Nov with 4 year estimate, 17% VP    2. Longstanding persistent Afib 3. AFlutter     Asymptomatic     CHA2DS2Vasc is 4 , on warfarin, monitored and managed with the coumadin clinic     Today is 3.6, managed with CC  3. HTN     No changes today  4.  SM on exam Check echo    Disposition: usual post procedure f/u  Current medicines are reviewed at length with the patient today.  The patient did not have any concerns regarding medicines.  Venetia Night, PA-C 07/29/2017 11:02 AM     [address] [address] [phone] (office)  [phone] (fax)

## 2022-08-27 ENCOUNTER — Ambulatory Visit (INDEPENDENT_AMBULATORY_CARE_PROVIDER_SITE_OTHER): Payer: 59 | Admitting: Pharmacist

## 2022-08-27 ENCOUNTER — Encounter: Payer: Self-pay | Admitting: Physician Assistant

## 2022-08-27 ENCOUNTER — Encounter: Payer: Self-pay | Admitting: *Deleted

## 2022-08-27 ENCOUNTER — Ambulatory Visit: Payer: POS | Attending: Physician Assistant | Admitting: Physician Assistant

## 2022-08-27 VITALS — BP 122/70 | HR 63 | Ht 64.0 in | Wt 143.0 lb

## 2022-08-27 DIAGNOSIS — I4811 Longstanding persistent atrial fibrillation: Secondary | ICD-10-CM

## 2022-08-27 DIAGNOSIS — I4891 Unspecified atrial fibrillation: Secondary | ICD-10-CM | POA: Diagnosis not present

## 2022-08-27 DIAGNOSIS — Z950 Presence of cardiac pacemaker: Secondary | ICD-10-CM | POA: Diagnosis not present

## 2022-08-27 DIAGNOSIS — Z01818 Encounter for other preprocedural examination: Secondary | ICD-10-CM

## 2022-08-27 DIAGNOSIS — Z5181 Encounter for therapeutic drug level monitoring: Secondary | ICD-10-CM | POA: Diagnosis not present

## 2022-08-27 DIAGNOSIS — R011 Cardiac murmur, unspecified: Secondary | ICD-10-CM | POA: Diagnosis not present

## 2022-08-27 DIAGNOSIS — I484 Atypical atrial flutter: Secondary | ICD-10-CM

## 2022-08-27 LAB — POCT INR: INR: 3.6 — AB (ref 2.0–3.0)

## 2022-08-27 NOTE — Patient Instructions (Addendum)
Medication Instructions:   Your physician recommends that you continue on your current medications as directed. Please refer to the Current Medication list given to you today.   *If you need a refill on your cardiac medications before your next appointment, please call F    Lab Work:  [place] 09-28-22   FOR  BMET  INR  DIG AND CBC  (ON THE DAY OF LAB APPOINTMENT  HOLD DIGOXIN UNTIL AFTER )  If you have labs (blood work) drawn today and your tests are completely normal, you will receive your results only by: [place] (if you have MyChart) OR A paper copy in the mail If you have any lab test that is abnormal or we need to change your treatment, we will call you to review the results.   Testing/Procedures:  SEE LETTER FOR PACE MAKER GEN CHANGE ON 09-28-22 WITH DR Lovena Le   Your physician has requested that you have an echocardiogram. Echocardiography is a painless test that uses sound waves to create images of your heart. It provides your doctor with information about the size and shape of your heart and how well your heart's chambers and valves are working. This procedure takes approximately one hour. There are no restrictions for this procedure. Please do NOT wear cologne, perfume, aftershave, or lotions (deodorant is allowed). Please arrive 15 minutes prior to your appointment time.   Follow-Up: At [facility], you and your health needs are our priority.  As part of our continuing mission to provide you with exceptional heart care, we have created designated Provider Care Teams.  These Care Teams include your primary Cardiologist (physician) and Advanced Practice Providers (APPs -  Physician Assistants and Nurse Practitioners) who all work together to provide you with the care you need, when you need it.  We recommend signing up for the patient portal called "MyChart".  Sign up information is provided on this After Visit Summary.  MyChart is used to connect with  patients for Virtual Visits (Telemedicine).  Patients are able to view lab/test results, encounter notes, upcoming appointments, etc.  Non-urgent messages can be sent to your provider as well.   To learn more about what you can do with MyChart, go to [email or web address].     Your next appointment:  AFTER 09-28-22  DEVICE CLINIC WOUND CHECK    3 month(s)  Provider:   Cristopher Peru, MD    Other Instructions

## 2022-08-27 NOTE — Patient Instructions (Signed)
Skip warfarin tomorrow then continue taking 2 tablets daily except 1 tablet on Sundays and Wednesdays.  DO NOT TAKE MORE THAN RECOMMENDED DOSE-CALL WITH ANY QUESTIONS.  Recheck INR in 3 weeks.

## 2022-09-07 ENCOUNTER — Ambulatory Visit: Payer: POS

## 2022-09-07 DIAGNOSIS — I495 Sick sinus syndrome: Secondary | ICD-10-CM

## 2022-09-07 LAB — CUP PACEART REMOTE DEVICE CHECK
Battery Remaining Longevity: 48 mo
Battery Remaining Percentage: 52 %
Brady Statistic RA Percent Paced: 0 %
Brady Statistic RV Percent Paced: 14 %
Date Time Interrogation Session: 20240212114900
Implantable Lead Connection Status: 753985
Implantable Lead Connection Status: 753985
Implantable Lead Implant Date: 20050517
Implantable Lead Implant Date: 20050517
Implantable Lead Location: 753859
Implantable Lead Location: 753860
Implantable Lead Model: 4086
Implantable Lead Model: 4087
Implantable Lead Serial Number: 218470
Implantable Lead Serial Number: 237198
Implantable Pulse Generator Implant Date: 20150112
Lead Channel Impedance Value: 449 Ohm
Lead Channel Impedance Value: 663 Ohm
Lead Channel Pacing Threshold Amplitude: 1.1 V
Lead Channel Pacing Threshold Pulse Width: 0.4 ms
Lead Channel Setting Pacing Amplitude: 2.4 V
Lead Channel Setting Pacing Pulse Width: 0.4 ms
Lead Channel Setting Sensing Sensitivity: 2.5 mV
Pulse Gen Serial Number: 389910
Zone Setting Status: 755011

## 2022-09-17 ENCOUNTER — Ambulatory Visit: Payer: POS

## 2022-09-22 ENCOUNTER — Telehealth: Payer: Self-pay | Admitting: Pharmacist

## 2022-09-22 ENCOUNTER — Ambulatory Visit: Payer: 59

## 2022-09-22 ENCOUNTER — Other Ambulatory Visit: Payer: Self-pay | Admitting: *Deleted

## 2022-09-22 NOTE — Telephone Encounter (Signed)
Pt due to have INR checked today and pt missed appointment. Called pt and LMOM to call clinic back.

## 2022-09-22 NOTE — Telephone Encounter (Signed)
Patient called back. Missed appointment this morning. Has procedure on 3/4. Advised he has lab work ordered which includes an INR. Patient will come tomorrow for lab work.

## 2022-09-23 ENCOUNTER — Other Ambulatory Visit: Payer: Self-pay

## 2022-09-23 ENCOUNTER — Ambulatory Visit: Payer: 59 | Attending: Internal Medicine

## 2022-09-23 DIAGNOSIS — Z01818 Encounter for other preprocedural examination: Secondary | ICD-10-CM

## 2022-09-23 DIAGNOSIS — Z5181 Encounter for therapeutic drug level monitoring: Secondary | ICD-10-CM

## 2022-09-23 DIAGNOSIS — I4891 Unspecified atrial fibrillation: Secondary | ICD-10-CM

## 2022-09-23 LAB — POCT INR: INR: 2.3 (ref 2.0–3.0)

## 2022-09-23 NOTE — Patient Instructions (Addendum)
Description   Continue taking 2 tablets daily except 1 tablet on Sundays and Wednesdays. Follow pre/post procedure Warfarin dosing instructions per Dr Lovena Le  DO NOT TAKE MORE THAN RECOMMENDED DOSE-CALL WITH ANY QUESTIONS.  Recheck INR 1 week post procedure Coumadin Clinic [phone]. Main # [phone]

## 2022-09-23 NOTE — Telephone Encounter (Signed)
Pt was seen in anticoagulation clinic today and POC INR was 2.3. Walked pt to lab after anticoagulation encounter to have other labs drawn.

## 2022-09-24 ENCOUNTER — Ambulatory Visit: Payer: 59

## 2022-09-24 ENCOUNTER — Ambulatory Visit (HOSPITAL_COMMUNITY): Payer: 59 | Attending: Physician Assistant

## 2022-09-24 DIAGNOSIS — R011 Cardiac murmur, unspecified: Secondary | ICD-10-CM | POA: Insufficient documentation

## 2022-09-24 LAB — BASIC METABOLIC PANEL
BUN/Creatinine Ratio: 19 (ref 10–24)
BUN: 25 mg/dL (ref 8–27)
CO2: 27 mmol/L (ref 20–29)
Calcium: 9.4 mg/dL (ref 8.6–10.2)
Chloride: 99 mmol/L (ref 96–106)
Creatinine, Ser: 1.35 mg/dL — ABNORMAL HIGH (ref 0.76–1.27)
Glucose: 79 mg/dL (ref 70–99)
Potassium: 4.7 mmol/L (ref 3.5–5.2)
Sodium: 141 mmol/L (ref 134–144)
eGFR: 54 mL/min/{1.73_m2} — ABNORMAL LOW (ref >59)

## 2022-09-24 LAB — ECHOCARDIOGRAM COMPLETE
AR max vel: 0.91 cm2
AV Area VTI: 0.94 cm2
AV Area mean vel: 0.86 cm2
AV Mean grad: 11 mmHg
AV Peak grad: 20.4 mmHg
Ao pk vel: 2.26 m/s
Area-P 1/2: 5.46 cm2
MV M vel: 5.18 m/s
MV Peak grad: 107.1 mmHg
Radius: 0.9 cm
S' Lateral: 3.7 cm

## 2022-09-24 LAB — CBC
Hematocrit: 40.6 % (ref 37.5–51.0)
Hemoglobin: 13.2 g/dL (ref 13.0–17.7)
MCH: 31.2 pg (ref 26.6–33.0)
MCHC: 32.5 g/dL (ref 31.5–35.7)
MCV: 96 fL (ref 79–97)
Platelets: 198 10*3/uL (ref 150–450)
RBC: 4.23 x10E6/uL (ref 4.14–5.80)
RDW: 13.1 % (ref 11.6–15.4)
WBC: 6.8 10*3/uL (ref 3.4–10.8)

## 2022-09-24 LAB — PROTIME-INR
INR: 2.1 — ABNORMAL HIGH (ref 0.9–1.2)
Prothrombin Time: 21.5 s — ABNORMAL HIGH (ref 9.1–12.0)

## 2022-09-24 LAB — DIGOXIN LEVEL: Digoxin, Serum: <0.4 ng/mL — ABNORMAL LOW (ref 0.5–0.9)

## 2022-09-25 ENCOUNTER — Ambulatory Visit: Payer: POS

## 2022-09-28 ENCOUNTER — Ambulatory Visit (HOSPITAL_COMMUNITY)
Admission: RE | Admit: 2022-09-28 | Discharge: 2022-09-28 | Disposition: A | Discharge status: Home / Self Care | Payer: 59 | Source: Ambulatory Visit | Attending: Internal Medicine | Admitting: Internal Medicine

## 2022-09-28 ENCOUNTER — Other Ambulatory Visit: Payer: Self-pay

## 2022-09-28 ENCOUNTER — Ambulatory Visit (HOSPITAL_COMMUNITY)
Admission: RE | Disposition: A | Discharge status: Home / Self Care | Payer: Self-pay | Source: Ambulatory Visit | Attending: Internal Medicine

## 2022-09-28 DIAGNOSIS — I480 Paroxysmal atrial fibrillation: Secondary | ICD-10-CM | POA: Insufficient documentation

## 2022-09-28 DIAGNOSIS — Z45010 Encounter for checking and testing of cardiac pacemaker pulse generator [battery]: Secondary | ICD-10-CM

## 2022-09-28 DIAGNOSIS — I10 Essential (primary) hypertension: Secondary | ICD-10-CM | POA: Diagnosis not present

## 2022-09-28 DIAGNOSIS — I442 Atrioventricular block, complete: Secondary | ICD-10-CM | POA: Diagnosis not present

## 2022-09-28 DIAGNOSIS — I2510 Atherosclerotic heart disease of native coronary artery without angina pectoris: Secondary | ICD-10-CM | POA: Diagnosis not present

## 2022-09-28 DIAGNOSIS — Z7901 Long term (current) use of anticoagulants: Secondary | ICD-10-CM | POA: Diagnosis not present

## 2022-09-28 DIAGNOSIS — I495 Sick sinus syndrome: Secondary | ICD-10-CM | POA: Diagnosis not present

## 2022-09-28 DIAGNOSIS — Z950 Presence of cardiac pacemaker: Secondary | ICD-10-CM

## 2022-09-28 HISTORY — PX: PPM GENERATOR CHANGEOUT: EP1233

## 2022-09-28 LAB — PROTIME-INR
INR: 1.3 — ABNORMAL HIGH (ref 0.8–1.2)
Prothrombin Time: 16.3 seconds — ABNORMAL HIGH (ref 11.4–15.2)

## 2022-09-28 SURGERY — PPM GENERATOR CHANGEOUT

## 2022-09-28 MED ORDER — FENTANYL CITRATE (PF) 100 MCG/2ML IJ SOLN
INTRAMUSCULAR | Status: AC
  Filled 2022-09-28: qty 2

## 2022-09-28 MED ORDER — POVIDONE-IODINE 10 % EX SWAB
2.0000 | Freq: Once | CUTANEOUS | Status: AC
  Administered 2022-09-28: 2 via TOPICAL

## 2022-09-28 MED ORDER — CEFAZOLIN SODIUM-DEXTROSE 2-4 GM/100ML-% IV SOLN
2.0000 g | INTRAVENOUS | Status: AC
  Administered 2022-09-28: 2 g via INTRAVENOUS

## 2022-09-28 MED ORDER — MIDAZOLAM HCL 5 MG/5ML IJ SOLN
INTRAMUSCULAR | Status: DC | PRN
  Administered 2022-09-28: 1 mg via INTRAVENOUS
  Administered 2022-09-28: 2 mg via INTRAVENOUS
  Administered 2022-09-28: 1 mg via INTRAVENOUS

## 2022-09-28 MED ORDER — LIDOCAINE HCL (PF) 1 % IJ SOLN
INTRAMUSCULAR | Status: DC | PRN
  Administered 2022-09-28: 50 mL

## 2022-09-28 MED ORDER — ACETAMINOPHEN 325 MG PO TABS: 325.0000 mg | ORAL_TABLET | ORAL | Status: DC | PRN

## 2022-09-28 MED ORDER — LIDOCAINE HCL 1 % IJ SOLN
INTRAMUSCULAR | Status: AC
  Filled 2022-09-28: qty 20

## 2022-09-28 MED ORDER — CEFAZOLIN SODIUM-DEXTROSE 2-4 GM/100ML-% IV SOLN
INTRAVENOUS | Status: AC
  Filled 2022-09-28: qty 100

## 2022-09-28 MED ORDER — CHLORHEXIDINE GLUCONATE 4 % EX LIQD: 4.0000 | Freq: Once | CUTANEOUS | Status: DC

## 2022-09-28 MED ORDER — MIDAZOLAM HCL 5 MG/5ML IJ SOLN
INTRAMUSCULAR | Status: AC
  Filled 2022-09-28: qty 5

## 2022-09-28 MED ORDER — FENTANYL CITRATE (PF) 100 MCG/2ML IJ SOLN
INTRAMUSCULAR | Status: DC | PRN
  Administered 2022-09-28 (×2): 12.5 ug via INTRAVENOUS
  Administered 2022-09-28: 25 ug via INTRAVENOUS

## 2022-09-28 MED ORDER — SODIUM CHLORIDE 0.9 % IV SOLN
80.0000 mg | INTRAVENOUS | Status: AC
  Administered 2022-09-28: 80 mg

## 2022-09-28 MED ORDER — LIDOCAINE HCL (PF) 1 % IJ SOLN
INTRAMUSCULAR | Status: AC
  Filled 2022-09-28: qty 30

## 2022-09-28 MED ORDER — SODIUM CHLORIDE 0.9 % IV SOLN: INTRAVENOUS | Status: DC

## 2022-09-28 MED ORDER — SODIUM CHLORIDE 0.9 % IV SOLN
INTRAVENOUS | Status: AC
  Filled 2022-09-28: qty 2

## 2022-09-28 MED ORDER — ONDANSETRON HCL 4 MG/2ML IJ SOLN: 4.0000 mg | Freq: Four times a day (QID) | INTRAMUSCULAR | Status: DC | PRN

## 2022-09-28 SURGICAL SUPPLY — 8 items
CABLE SURGICAL S-101-97-12 (CABLE) ×1 IMPLANT
PACEMAKER ACCOLADE DR-EL (Pacemaker) IMPLANT
PACK EP LATEX FREE (CUSTOM PROCEDURE TRAY) ×1
PACK EP LF (CUSTOM PROCEDURE TRAY) IMPLANT
PAD DEFIB RADIO PHYSIO CONN (PAD) ×1 IMPLANT
POUCH AIGIS-R ANTIBACT PPM (Mesh General) ×1 IMPLANT
POUCH AIGIS-R ANTIBACT PPM MED (Mesh General) IMPLANT
TRAY PACEMAKER INSERTION (PACKS) ×1 IMPLANT

## 2022-09-28 NOTE — H&P (Signed)
HPI [name] returns today for followup. The patient is a pleasant 77 yo man with PAF, HTN, and CAD. He has sinus node dysfunction and is s/p PPM insertion. The patient has felt well in the interim with no chest pain or sob. No syncope. He has been exercising most days of the week including yoga. He denies palpitations. No Known Allergies           Current Outpatient Medications  Medication Sig Dispense Refill   acetaminophen (TYLENOL) 325 MG tablet Take 2 tablets (650 mg total) by mouth every 6 (six) hours as needed for fever. (Patient taking differently: Take 500 mg by mouth every 6 (six) hours as needed for fever.)       atorvastatin (LIPITOR) 80 MG tablet Take 40 mg by mouth at bedtime.        Calcium Carbonate-Vitamin D 500-125 MG-UNIT TABS Take 1 tablet by mouth 2 (two) times daily.       digoxin (LANOXIN) 0.125 MG tablet TAKE 1 TABLET BY MOUTH DAILY. PLEASE KEEP NOVEMBER APPT TO GET MORE REFILLS. 90 tablet 3   hydrochlorothiazide (MICROZIDE) 12.5 MG capsule Take 12.5 mg by mouth daily.       LISINOPRIL PO Take by mouth.       QUEtiapine (SEROQUEL) 25 MG tablet Take 12.5 mg by mouth at bedtime.        sertraline (ZOLOFT) 100 MG tablet Take 200 mg by mouth daily.        sildenafil (VIAGRA) 50 MG tablet Take by mouth as needed.       thiamine 100 MG tablet Take 100 mg by mouth daily.       verapamil (CALAN-SR) 180 MG CR tablet Take 1 tablet by mouth daily.       vitamin B-12 (CYANOCOBALAMIN) 500 MCG tablet Take 500 mcg by mouth 2 (two) times daily.       warfarin (COUMADIN) 2 MG tablet TAKE 2 TABLETS BY MOUTH DAILY. EXCEPT 1 TABLET ON SUNDAYS AND WEDNESDAYS OR AS DIRECTED BY COUMADIN CLINIC 60 tablet 3    No current facility-administered medications for this visit.            Past Medical History:  Diagnosis Date   A-fib ([place])     Abdominal hernia     Anxiety     Arthritis     Atrial fibrillation ([facility])     Atrial flutter ([facility])     Blood transfusion without  reported diagnosis     Cataract      bilateral cataracts removed   Clotting disorder ([place])      coumadin - a fib   Coronary artery disease     Depression     Hyperlipidemia     Hypertension     Pacemaker      [employer]   Sinus bradycardia     Sleep apnea      wears a c-pap      ROS:    All systems reviewed and negative except as noted in the HPI.          Past Surgical History:  Procedure Laterality Date   CATARACT EXTRACTION       COLONOSCOPY       hernia repair       PACEMAKER INSERTION       PERMANENT PACEMAKER GENERATOR CHANGE N/A 08/07/2013    Procedure: PERMANENT PACEMAKER GENERATOR CHANGE;  Surgeon: Evans Lance, MD;  Location:  [facility] CATH [facility];  Service: Cardiovascular;  Laterality: N/A;   PERMANENT PACEMAKER INSERTION   2005; 08/07/2013    BSX dual chamber pacemaker implanted 2005 for symptomatic bradycardia; gen change 2015 by Dr Lovena Le   REPLACEMENT TOTAL KNEE        right knee   thumb surgery        left and right   TONSILLECTOMY                 Family History  Problem Relation Age of Onset   Cancer Father          lung cancer   Stomach cancer Mother     Stomach cancer Sister     Colon cancer Neg Hx     Esophageal cancer Neg Hx     Pancreatic cancer Neg Hx     Prostate cancer Neg Hx     Rectal cancer Neg Hx          Social History         Socioeconomic History   Marital status: Single      Spouse name: Not on file   Number of children: Not on file   Years of education: Not on file   Highest education level: Not on file  Occupational History   Not on file  Tobacco Use   Smoking status: Never   Smokeless tobacco: Never   Tobacco comments:      Marijuana  Vaping Use   Vaping Use: Never used  Substance and Sexual Activity   Alcohol use: Yes      Comment: 4-5 beers weekly   Drug use: Not Currently      Types: Marijuana      Comment: smoked marijuana 09-26-16   Sexual activity: Yes      Partners: Female      Birth  control/protection: None  Other Topics Concern   Not on file  Social History Narrative    ** Merged History Encounter **         Pt reports having a girlfriend but plans to break up with her    Social Determinants of Health        Financial Resource Strain: Low Risk  (03/31/2018)    Overall Financial Resource Strain (CARDIA)     Difficulty of Paying Living Expenses: Not hard at all  Food Insecurity: No Food Insecurity (03/31/2018)    Hunger Vital Sign     Worried About Running Out of Food in the Last Year: Never true     Ran Out of Food in the Last Year: Never true  Transportation Needs: No Transportation Needs (03/31/2018)    PRAPARE - Armed forces logistics/support/administrative officer (Medical): No     Lack of Transportation (Non-Medical): No  Physical Activity: Inactive (03/31/2018)    Exercise Vital Sign     Days of Exercise per Week: 0 days     Minutes of Exercise per Session: 0 min  Stress: Stress Concern Present (03/31/2018)    [place]     Feeling of Stress : To some extent  Social Connections: Moderately Integrated (03/31/2018)    Social Connection and Isolation Panel [NHANES]     Frequency of Communication with Friends and Family: More than three times a week     Frequency of Social Gatherings with Friends and Family: Once a week     Attends Religious Services: More than 4 times per year  Active Member of Clubs or Organizations: Yes     Attends Archivist Meetings: 1 to 4 times per year     Marital Status: Never married  Intimate Partner Violence: Not At Risk (03/31/2018)    Humiliation, Afraid, Rape, and Kick questionnaire     Fear of Current or Ex-Partner: No     Emotionally Abused: No     Physically Abused: No     Sexually Abused: No        BP (!) 94/56   Pulse 67   Ht '5\' 4"'$  (1.626 m)   Wt 144 lb 9.6 oz (65.6 kg)   SpO2 97%   BMI 24.82 kg/m    Physical Exam:   Well appearing NAD HEENT:  Unremarkable Neck:  No JVD, no thyromegally Lymphatics:  No adenopathy Back:  No CVA tenderness Lungs:  Clear HEART:  Regular rate rhythm, no murmurs, no rubs, no clicks Abd:  soft, positive bowel sounds, no organomegally, no rebound, no guarding Ext:  2 plus pulses, no edema, no cyanosis, no clubbing Skin:  No rashes no nodules Neuro:  CN II through XII intact, motor grossly intact   EKG   DEVICE  Normal device function.  See PaceArt for details.    Assess/Plan:  1. Atrial fib/flutter - his VR is mostly well controlled. He will continue his current meds. 2. Coags - we discussed continuing Warfarin vs switching to an [place]. For now he would like to continue his warfarin.  3. PPM - his [employer] DDD PM is working normally.  4. HTN - his BP is well controlled. I encouraged him to continue his current meds, avoid salty foods and continue to exercise.   Carleene Overlie Lenton Gendreau,M.D  EP Attending  Since his last clinic visit, he has reached ERI and will undergo PM gen change with relocation of the pocket to a subpectoral location.  Carleene Overlie Darris Carachure,MD

## 2022-09-28 NOTE — Discharge Instructions (Signed)

## 2022-09-29 ENCOUNTER — Encounter (HOSPITAL_COMMUNITY): Payer: Self-pay | Admitting: Internal Medicine

## 2022-09-29 MED FILL — Lidocaine HCl Local Preservative Free (PF) Inj 1%: INTRAMUSCULAR | Qty: 30 | Status: AC

## 2022-09-29 MED FILL — Lidocaine HCl Local Inj 1%: INTRAMUSCULAR | Qty: 20 | Status: AC

## 2022-10-05 ENCOUNTER — Ambulatory Visit (INDEPENDENT_AMBULATORY_CARE_PROVIDER_SITE_OTHER): Payer: 59 | Admitting: *Deleted

## 2022-10-05 DIAGNOSIS — I4891 Unspecified atrial fibrillation: Secondary | ICD-10-CM | POA: Diagnosis not present

## 2022-10-05 DIAGNOSIS — Z5181 Encounter for therapeutic drug level monitoring: Secondary | ICD-10-CM

## 2022-10-05 LAB — POCT INR: POC INR: 1.9

## 2022-10-05 NOTE — Patient Instructions (Addendum)
Description   Take an extra 1/2 tablet of warfarin today and then continue to take warfarin 2 tablets daily except for 1 tablet on Sunday and Wednesday. Recheck INR in 2 weeks.  Coumadin Clinic [phone]. Main # [phone]

## 2022-10-06 ENCOUNTER — Telehealth: Payer: Self-pay

## 2022-10-06 NOTE — Telephone Encounter (Signed)
I spoke with the patient about his monitor. He states he did not plug it in yet because he only has one arm to work with. He states he will try to figure it out.

## 2022-10-07 ENCOUNTER — Ambulatory Visit: Payer: 59 | Attending: Cardiovascular Disease

## 2022-10-07 DIAGNOSIS — I495 Sick sinus syndrome: Secondary | ICD-10-CM | POA: Diagnosis not present

## 2022-10-07 LAB — CUP PACEART INCLINIC DEVICE CHECK
Date Time Interrogation Session: 20240313131640
Implantable Lead Connection Status: 753985
Implantable Lead Connection Status: 753985
Implantable Lead Implant Date: 20050517
Implantable Lead Implant Date: 20050517
Implantable Lead Location: 753859
Implantable Lead Location: 753860
Implantable Lead Model: 4086
Implantable Lead Model: 4087
Implantable Lead Serial Number: 218470
Implantable Lead Serial Number: 237198
Implantable Pulse Generator Implant Date: 20240304
Lead Channel Impedance Value: 484 Ohm
Lead Channel Impedance Value: 685 Ohm
Lead Channel Pacing Threshold Amplitude: 1.3 V
Lead Channel Pacing Threshold Pulse Width: 0.4 ms
Lead Channel Sensing Intrinsic Amplitude: 11.1 mV
Lead Channel Setting Pacing Amplitude: 2.5 V
Lead Channel Setting Pacing Pulse Width: 0.4 ms
Lead Channel Setting Sensing Sensitivity: 2.5 mV
Pulse Gen Serial Number: 637012
Zone Setting Status: 755011

## 2022-10-07 NOTE — Progress Notes (Signed)
Wound check appointment. Steri-strips removed. Bruising noted to torso-assessed with Dr. Curt Bears.  Pt will hold warfarin for 7 days. Incision edges approximated, wound well healed. Normal device function. Thresholds, sensing, and impedances consistent with implant measurements. Device programmed RV output 2.5V at 0.18m. Histogram distribution appropriate for patient and level of activity. No mode switches or high ventricular rates noted. Patient educated about wound care.  Pt will monitor bruising and contact device clinic if not resolving in 7 days. ROV in 3 months with implanting physician.

## 2022-10-07 NOTE — Patient Instructions (Addendum)
After Your Pacemaker  HOLD your WARFARIN for 7 days.  You may restart on October 14, 2022. If the bruising does not improve please call the [facility] and we will bring you back in to evaluate. [facility] [phone]   Monitor your pacemaker site for redness, swelling, and drainage. Call the device clinic at [phone] if you experience these symptoms or fever/chills.  Your incision was closed with Steri-strips or staples:  You may shower 7 days after your procedure and wash your incision with soap and water. Avoid lotions, ointments, or perfumes over your incision until it is well-healed.  You may use a hot tub or a pool after your wound check appointment if the incision is completely closed.  There are no restrictions in arm movement after your wound check appointment.  You may drive, unless driving has been restricted by your healthcare providers.  Remote monitoring is used to monitor your pacemaker from home. This monitoring is scheduled every 91 days by our office. It allows [place] to keep an eye on the functioning of your device to ensure it is working properly. You will routinely see your Electrophysiologist annually (more often if necessary).

## 2022-10-08 NOTE — Telephone Encounter (Signed)
Transmission received.

## 2022-10-19 ENCOUNTER — Ambulatory Visit: Payer: 59 | Attending: Cardiovascular Disease | Admitting: Pharmacist

## 2022-10-19 DIAGNOSIS — Z5181 Encounter for therapeutic drug level monitoring: Secondary | ICD-10-CM

## 2022-10-19 DIAGNOSIS — I4891 Unspecified atrial fibrillation: Secondary | ICD-10-CM | POA: Diagnosis not present

## 2022-10-19 LAB — POCT INR: INR: 1.8 — AB (ref 2.0–3.0)

## 2022-10-19 NOTE — Patient Instructions (Signed)
Description   Take 3 tablets today and tomorrow and then continue to take warfarin 2 tablets daily except for 1 tablet on Sunday and Wednesday. Recheck INR in 2 weeks.  Coumadin Clinic [phone]. Main # [phone]

## 2022-10-20 NOTE — Progress Notes (Signed)
Remote pacemaker transmission.   

## 2022-11-02 ENCOUNTER — Ambulatory Visit: Payer: 59 | Attending: Internal Medicine | Admitting: *Deleted

## 2022-11-02 DIAGNOSIS — I4891 Unspecified atrial fibrillation: Secondary | ICD-10-CM | POA: Diagnosis not present

## 2022-11-02 LAB — POCT INR: POC INR: 3.2

## 2022-11-02 NOTE — Patient Instructions (Signed)
Description   Take 1 tablet of warfarin tomorrow, then continue taking  2 tablets daily except for 1 tablet on Sunday and Wednesday. Recheck INR in 2 weeks.  Coumadin Clinic [phone]. Main # [phone]

## 2022-11-17 ENCOUNTER — Ambulatory Visit: Payer: 59 | Attending: Internal Medicine | Admitting: *Deleted

## 2022-11-17 DIAGNOSIS — I4891 Unspecified atrial fibrillation: Secondary | ICD-10-CM

## 2022-11-17 DIAGNOSIS — Z5181 Encounter for therapeutic drug level monitoring: Secondary | ICD-10-CM | POA: Diagnosis not present

## 2022-11-17 LAB — POCT INR: INR: 4.3 — AB (ref 2.0–3.0)

## 2022-11-17 NOTE — Patient Instructions (Signed)
Description   Do not take any warfarin tomorrow and no warfarin Thursday then START taking 2 tablets daily except for 1 tablet on Sunday, Wednesday, and Friday. Recheck INR in 2 weeks.  Coumadin Clinic [phone]. Main # [phone]

## 2022-12-02 ENCOUNTER — Ambulatory Visit: Payer: 59 | Attending: Cardiology | Admitting: *Deleted

## 2022-12-02 DIAGNOSIS — I4891 Unspecified atrial fibrillation: Secondary | ICD-10-CM

## 2022-12-02 DIAGNOSIS — Z5181 Encounter for therapeutic drug level monitoring: Secondary | ICD-10-CM | POA: Diagnosis not present

## 2022-12-02 LAB — POCT INR: INR: 2.1 (ref 2.0–3.0)

## 2022-12-02 NOTE — Patient Instructions (Signed)
Description   Continue taking the dose you have been taking, which is, warfarin 2 tablets daily except for 1 tablet on Sunday and Wednesday. Recheck INR in 3 weeks.  Coumadin Clinic [phone]. Main # [phone]

## 2022-12-07 ENCOUNTER — Ambulatory Visit: Payer: POS

## 2022-12-14 ENCOUNTER — Other Ambulatory Visit: Payer: Self-pay | Admitting: *Deleted

## 2022-12-14 DIAGNOSIS — I4891 Unspecified atrial fibrillation: Secondary | ICD-10-CM

## 2022-12-14 MED ORDER — WARFARIN SODIUM 2 MG PO TABS: ORAL_TABLET | ORAL | 2 refills | Status: DC

## 2022-12-14 NOTE — Telephone Encounter (Signed)
Prescription refill request received for warfarin Lov: Phillip Kline, 08/27/2022 Next INR check: 5/29 Warfarin tablet strength: 2mg 

## 2022-12-16 ENCOUNTER — Other Ambulatory Visit: Payer: Self-pay

## 2022-12-16 DIAGNOSIS — I4891 Unspecified atrial fibrillation: Secondary | ICD-10-CM

## 2022-12-16 MED ORDER — WARFARIN SODIUM 2 MG PO TABS: ORAL_TABLET | ORAL | 2 refills | Status: DC

## 2022-12-23 ENCOUNTER — Ambulatory Visit: Payer: 59 | Attending: Internal Medicine

## 2022-12-23 DIAGNOSIS — Z5181 Encounter for therapeutic drug level monitoring: Secondary | ICD-10-CM | POA: Diagnosis not present

## 2022-12-23 DIAGNOSIS — I4891 Unspecified atrial fibrillation: Secondary | ICD-10-CM

## 2022-12-23 LAB — POCT INR: INR: 2.4 (ref 2.0–3.0)

## 2022-12-23 NOTE — Patient Instructions (Signed)
Description   Continue taking the dose you have been taking, which is, warfarin 2 tablets daily except for 1 tablet on Sunday and Wednesday. Recheck INR in 4 weeks.  Coumadin Clinic [phone]. Main # [phone]

## 2022-12-29 ENCOUNTER — Ambulatory Visit (INDEPENDENT_AMBULATORY_CARE_PROVIDER_SITE_OTHER): Payer: 59

## 2022-12-29 DIAGNOSIS — I495 Sick sinus syndrome: Secondary | ICD-10-CM | POA: Diagnosis not present

## 2022-12-29 LAB — CUP PACEART REMOTE DEVICE CHECK
Battery Remaining Longevity: 180 mo
Battery Remaining Percentage: 100 %
Brady Statistic RA Percent Paced: 0 %
Brady Statistic RV Percent Paced: 6 %
Date Time Interrogation Session: 20240604045100
Implantable Lead Connection Status: 753985
Implantable Lead Connection Status: 753985
Implantable Lead Implant Date: 20050517
Implantable Lead Implant Date: 20050517
Implantable Lead Location: 753859
Implantable Lead Location: 753860
Implantable Lead Model: 4086
Implantable Lead Model: 4087
Implantable Lead Serial Number: 218470
Implantable Lead Serial Number: 237198
Implantable Pulse Generator Implant Date: 20240304
Lead Channel Impedance Value: 658 Ohm
Lead Channel Setting Pacing Amplitude: 2.5 V
Lead Channel Setting Pacing Pulse Width: 0.4 ms
Lead Channel Setting Sensing Sensitivity: 2.5 mV
Pulse Gen Serial Number: 637012
Zone Setting Status: 755011

## 2023-01-05 ENCOUNTER — Encounter: Payer: Self-pay | Admitting: Internal Medicine

## 2023-01-05 ENCOUNTER — Ambulatory Visit: Payer: 59 | Attending: Internal Medicine | Admitting: Internal Medicine

## 2023-01-05 VITALS — BP 128/76 | HR 74 | Resp 20 | Wt 138.0 lb

## 2023-01-05 DIAGNOSIS — I10 Essential (primary) hypertension: Secondary | ICD-10-CM

## 2023-01-05 DIAGNOSIS — I495 Sick sinus syndrome: Secondary | ICD-10-CM

## 2023-01-05 DIAGNOSIS — I4819 Other persistent atrial fibrillation: Secondary | ICD-10-CM | POA: Diagnosis not present

## 2023-01-05 DIAGNOSIS — Z950 Presence of cardiac pacemaker: Secondary | ICD-10-CM

## 2023-01-05 LAB — CUP PACEART INCLINIC DEVICE CHECK
Date Time Interrogation Session: 20240611162704
Implantable Lead Connection Status: 753985
Implantable Lead Connection Status: 753985
Implantable Lead Implant Date: 20050517
Implantable Lead Implant Date: 20050517
Implantable Lead Location: 753859
Implantable Lead Location: 753860
Implantable Lead Model: 4086
Implantable Lead Model: 4087
Implantable Lead Serial Number: 218470
Implantable Lead Serial Number: 237198
Implantable Pulse Generator Implant Date: 20240304
Lead Channel Impedance Value: 451 Ohm
Lead Channel Impedance Value: 696 Ohm
Lead Channel Pacing Threshold Amplitude: 1 V
Lead Channel Pacing Threshold Pulse Width: 0.4 ms
Lead Channel Sensing Intrinsic Amplitude: 11.1 mV
Lead Channel Setting Pacing Amplitude: 2.5 V
Lead Channel Setting Pacing Pulse Width: 0.4 ms
Lead Channel Setting Sensing Sensitivity: 2.5 mV
Pulse Gen Serial Number: 637012
Zone Setting Status: 755011

## 2023-01-05 NOTE — Patient Instructions (Addendum)
Medication Instructions:  Your physician recommends that you continue on your current medications as directed. Please refer to the Current Medication list given to you today.  *If you need a refill on your cardiac medications before your next appointment, please call your pharmacy*  Lab Work: None ordered.  If you have labs (blood work) drawn today and your tests are completely normal, you will receive your results only by: MyChart Message (if you have MyChart) OR A paper copy in the mail If you have any lab test that is abnormal or we need to change your treatment, we will call you to review the results.  Testing/Procedures: None ordered.  Follow-Up: At [facility], you and your health needs are our priority.  As part of our continuing mission to provide you with exceptional heart care, we have created designated Provider Care Teams.  These Care Teams include your primary Cardiologist (physician) and Advanced Practice Providers (APPs -  Physician Assistants and Nurse Practitioners) who all work together to provide you with the care you need, when you need it.   Your next appointment:   1 year(s)  The format for your next appointment:   In Person  Provider:   Lewayne Bunting, MD{or one of the following Advanced Practice Providers on your designated Care Team:   Francis Dowse, [place] Casimiro Needle "Mardelle Matte" Lanna Poche, [place]  Remote monitoring is used to monitor your Pacemaker from home. This monitoring reduces the number of office visits required to check your device to one time per year. It allows [place] to keep an eye on the functioning of your device to ensure it is working properly. You are scheduled for a device check from home on 9/3. You may send your transmission at any time that day. If you have a wireless device, the transmission will be sent automatically. After your physician reviews your transmission, you will receive a postcard with your next transmission date.

## 2023-01-05 NOTE — Progress Notes (Signed)
HPI [name] returns today for followup. The patient is a pleasant 77 yo man with PAF, HTN, and CAD. He has sinus node dysfunction and is s/p PPM insertion. The patient has felt well in the interim with no chest pain or sob. No syncope. He has been exercising most days of the week including yoga. He denies palpitations.  No Known Allergies   Current Outpatient Medications  Medication Sig Dispense Refill   acetaminophen (TYLENOL) 325 MG tablet Take 2 tablets (650 mg total) by mouth every 6 (six) hours as needed for fever. (Patient taking differently: Take 500 mg by mouth every 6 (six) hours as needed for fever.)     atorvastatin (LIPITOR) 80 MG tablet Take 40 mg by mouth at bedtime.      Calcium Carbonate-Vitamin D 500-125 MG-UNIT TABS Take 1 tablet by mouth 2 (two) times daily.     digoxin (LANOXIN) 0.125 MG tablet TAKE 1 TABLET BY MOUTH DAILY. PLEASE KEEP NOVEMBER APPT TO GET MORE REFILLS. (Patient taking differently: Take 0.125 mg by mouth daily.) 90 tablet 3   hydrochlorothiazide (MICROZIDE) 12.5 MG capsule Take 12.5 mg by mouth daily.     lisinopril (ZESTRIL) 2.5 MG tablet Take 2.5 mg by mouth daily.     QUEtiapine (SEROQUEL) 25 MG tablet Take 12.5 mg by mouth at bedtime.      sertraline (ZOLOFT) 100 MG tablet Take 200 mg by mouth daily.      sildenafil (VIAGRA) 50 MG tablet Take by mouth as needed.     thiamine 100 MG tablet Take 100 mg by mouth daily.     verapamil (CALAN-SR) 180 MG CR tablet Take 1 tablet by mouth daily.     vitamin B-12 (CYANOCOBALAMIN) 500 MCG tablet Take 500 mcg by mouth 2 (two) times daily.     warfarin (COUMADIN) 2 MG tablet Take 1 to 2 tablets by mouth daily as directed by the coumadin clinic. 60 tablet 2   No current facility-administered medications for this visit.     Past Medical History:  Diagnosis Date   A-fib ([facility])    Abdominal hernia    Anxiety    Arthritis    Atrial fibrillation ([facility])    Atrial flutter ([facility])    Blood transfusion  without reported diagnosis    Cataract    bilateral cataracts removed   Clotting disorder ([facility])    coumadin - a fib   Coronary artery disease    Depression    Hyperlipidemia    Hypertension    Pacemaker    [employer]   Sinus bradycardia    Sleep apnea    wears a c-pap    ROS:   All systems reviewed and negative except as noted in the HPI.   Past Surgical History:  Procedure Laterality Date   CATARACT EXTRACTION     COLONOSCOPY     hernia repair     PACEMAKER INSERTION     PERMANENT PACEMAKER GENERATOR CHANGE N/A 08/07/2013   Procedure: PERMANENT PACEMAKER GENERATOR CHANGE;  Surgeon: Marinus Maw, MD;  Location: [facility];  Service: Cardiovascular;  Laterality: N/A;   PERMANENT PACEMAKER INSERTION  2005; 08/07/2013   BSX dual chamber pacemaker implanted 2005 for symptomatic bradycardia; gen change 2015 by Dr Ladona Ridgel   [facility] GENERATOR CHANGEOUT N/A 09/28/2022   Procedure: PPM GENERATOR CHANGEOUT;  Surgeon: Marinus Maw, MD;  Location: [facility] INVASIVE CV LAB;  Service: Cardiovascular;  Laterality: N/A;   REPLACEMENT TOTAL KNEE  right knee   thumb surgery     left and right   TONSILLECTOMY       Family History  Problem Relation Age of Onset   Cancer Father        lung cancer   Stomach cancer Mother    Stomach cancer Sister    Colon cancer Neg Hx    Esophageal cancer Neg Hx    Pancreatic cancer Neg Hx    Prostate cancer Neg Hx    Rectal cancer Neg Hx      Social History   Socioeconomic History   Marital status: Single    Spouse name: Not on file   Number of children: Not on file   Years of education: Not on file   Highest education level: Not on file  Occupational History   Not on file  Tobacco Use   Smoking status: Never   Smokeless tobacco: Never   Tobacco comments:    Marijuana  Vaping Use   Vaping Use: Never used  Substance and Sexual Activity   Alcohol use: Yes    Comment: 4-5 beers weekly   Drug use: Not Currently    Types: Marijuana     Comment: smoked marijuana 09-26-16   Sexual activity: Yes    Partners: Female    Birth control/protection: None  Other Topics Concern   Not on file  Social History Narrative   ** Merged History Encounter **       Pt reports having a girlfriend but plans to break up with her   Social Determinants of Health   Financial Resource Strain: Low Risk  (03/31/2018)   Overall Financial Resource Strain (CARDIA)    Difficulty of Paying Living Expenses: Not hard at all  Food Insecurity: No Food Insecurity (03/31/2018)   Hunger Vital Sign    Worried About Running Out of Food in the Last Year: Never true    Ran Out of Food in the Last Year: Never true  Transportation Needs: No Transportation Needs (03/31/2018)   PRAPARE - Administrator, Civil Service (Medical): No    Lack of Transportation (Non-Medical): No  Physical Activity: Inactive (03/31/2018)   Exercise Vital Sign    Days of Exercise per Week: 0 days    Minutes of Exercise per Session: 0 min  Stress: Stress Concern Present (03/31/2018)   [employer] of Occupational Health - Occupational Stress Questionnaire    Feeling of Stress : To some extent  Social Connections: Moderately Integrated (03/31/2018)   Social Connection and Isolation Panel [NHANES]    Frequency of Communication with Friends and Family: More than three times a week    Frequency of Social Gatherings with Friends and Family: Once a week    Attends Religious Services: More than 4 times per year    Active Member of [employer] or Organizations: Yes    Attends Banker Meetings: 1 to 4 times per year    Marital Status: Never married  Intimate Partner Violence: Not At Risk (03/31/2018)   Humiliation, Afraid, Rape, and Kick questionnaire    Fear of Current or Ex-Partner: No    Emotionally Abused: No    Physically Abused: No    Sexually Abused: No     BP 128/76 (BP Location: Left Arm)   Pulse 74   Resp 20   Wt 138 lb 0.6 oz (62.6 kg)   SpO2 97%   BMI  23.69 kg/m   Physical Exam:  Well appearing NAD HEENT:  Unremarkable Neck:  No JVD, no thyromegally Lymphatics:  No adenopathy Back:  No CVA tenderness Lungs:  Clear HEART:  Regular rate rhythm, no murmurs, no rubs, no clicks Abd:  soft, positive bowel sounds, no organomegally, no rebound, no guarding Ext:  2 plus pulses, no edema, no cyanosis, no clubbing Skin:  No rashes no nodules Neuro:  CN II through XII intact, motor grossly intact  EKG - atrial fib with a controlled VR  DEVICE  Normal device function.  See PaceArt for details.   Assess/Plan: 1. Atrial fib/flutter - his VR is mostly well controlled. He will continue his current meds. 2. Coags - He is doing well since he healed up from his PPM gen change out. 3. PPM - his [employer] DDD PM is working normally as he is s/p gen change out. He had a large hematoma which eventually resolved. 4. HTN - his BP is well controlled. I encouraged him to continue his current meds, avoid salty foods and continue to exercise.   Leonia Reeves.D

## 2023-01-20 ENCOUNTER — Ambulatory Visit: Payer: 59

## 2023-01-20 NOTE — Progress Notes (Signed)
Remote pacemaker transmission.   

## 2023-01-25 ENCOUNTER — Ambulatory Visit: Payer: 59

## 2023-02-03 ENCOUNTER — Ambulatory Visit: Payer: 59 | Attending: Cardiology

## 2023-02-03 DIAGNOSIS — I4891 Unspecified atrial fibrillation: Secondary | ICD-10-CM

## 2023-02-03 DIAGNOSIS — Z5181 Encounter for therapeutic drug level monitoring: Secondary | ICD-10-CM

## 2023-02-03 LAB — POCT INR: INR: 2.6 (ref 2.0–3.0)

## 2023-02-03 NOTE — Patient Instructions (Signed)
Description   Continue taking warfarin 2 tablets daily except for 1 tablet on Sunday and Wednesday.  Recheck INR in 5 weeks.  Coumadin Clinic [phone]. Main # [phone]

## 2023-03-08 ENCOUNTER — Ambulatory Visit: Payer: POS

## 2023-03-10 ENCOUNTER — Ambulatory Visit: Payer: 59

## 2023-03-10 DIAGNOSIS — Z5181 Encounter for therapeutic drug level monitoring: Secondary | ICD-10-CM

## 2023-03-10 DIAGNOSIS — I4891 Unspecified atrial fibrillation: Secondary | ICD-10-CM | POA: Diagnosis not present

## 2023-03-10 LAB — POCT INR: INR: 2.6 (ref 2.0–3.0)

## 2023-03-10 NOTE — Patient Instructions (Signed)
Description   Continue taking warfarin 2 tablets daily except for 1 tablet on Sundays and Wednesdays.  Recheck INR in 6 weeks.  Coumadin Clinic [phone]. Main # [phone]

## 2023-03-11 ENCOUNTER — Other Ambulatory Visit: Payer: Self-pay | Admitting: Internal Medicine

## 2023-03-11 DIAGNOSIS — I4891 Unspecified atrial fibrillation: Secondary | ICD-10-CM

## 2023-03-30 ENCOUNTER — Ambulatory Visit (INDEPENDENT_AMBULATORY_CARE_PROVIDER_SITE_OTHER): Payer: 59

## 2023-03-30 DIAGNOSIS — I495 Sick sinus syndrome: Secondary | ICD-10-CM

## 2023-04-07 LAB — CUP PACEART REMOTE DEVICE CHECK
Battery Remaining Longevity: 180 mo
Battery Remaining Percentage: 100 %
Brady Statistic RA Percent Paced: 0 %
Brady Statistic RV Percent Paced: 21 %
Date Time Interrogation Session: 20240903045000
Implantable Lead Connection Status: 753985
Implantable Lead Connection Status: 753985
Implantable Lead Implant Date: 20050517
Implantable Lead Implant Date: 20050517
Implantable Lead Location: 753859
Implantable Lead Location: 753860
Implantable Lead Model: 4086
Implantable Lead Model: 4087
Implantable Lead Serial Number: 218470
Implantable Lead Serial Number: 237198
Implantable Pulse Generator Implant Date: 20240304
Lead Channel Impedance Value: 656 Ohm
Lead Channel Setting Pacing Amplitude: 2.5 V
Lead Channel Setting Pacing Pulse Width: 0.4 ms
Lead Channel Setting Sensing Sensitivity: 2.5 mV
Pulse Gen Serial Number: 637012
Zone Setting Status: 755011

## 2023-04-07 NOTE — Progress Notes (Signed)
Remote pacemaker transmission.   

## 2023-04-14 ENCOUNTER — Telehealth: Payer: Self-pay

## 2023-04-14 NOTE — Telephone Encounter (Signed)
Following alert received from [employer] received for Repeat device alert - Right ventricular pacing of > 50%. Pacing was 63% between Apr 13, 2023 04:45 and Apr 14, 2023 04:45.  Increase in RV pacing since mid June per histogram Route to triage, consider programming alert off.  Routing to Dr. Ladona Ridgel to advise if we can discontinue RV pacing alert?

## 2023-04-16 NOTE — Telephone Encounter (Signed)
Program alert off.

## 2023-04-19 NOTE — Telephone Encounter (Signed)
RV pacing alert programmed off.

## 2023-04-21 ENCOUNTER — Ambulatory Visit: Payer: 59 | Attending: Cardiovascular Disease

## 2023-04-21 DIAGNOSIS — Z5181 Encounter for therapeutic drug level monitoring: Secondary | ICD-10-CM

## 2023-04-21 DIAGNOSIS — I4891 Unspecified atrial fibrillation: Secondary | ICD-10-CM

## 2023-04-21 LAB — POCT INR: INR: 3.4 — AB (ref 2.0–3.0)

## 2023-04-21 NOTE — Patient Instructions (Signed)
Description   HOLD tomorrow's dose and then continue taking warfarin 2 tablets daily except for 1 tablet on Sundays and Wednesdays.  Recheck INR in 5 weeks.  Coumadin Clinic [phone]. Main # [phone]

## 2023-05-26 ENCOUNTER — Ambulatory Visit: Payer: 59 | Attending: Internal Medicine

## 2023-05-26 DIAGNOSIS — I4891 Unspecified atrial fibrillation: Secondary | ICD-10-CM

## 2023-05-26 DIAGNOSIS — Z5181 Encounter for therapeutic drug level monitoring: Secondary | ICD-10-CM | POA: Diagnosis not present

## 2023-05-26 LAB — POCT INR: INR: 4.4 — AB (ref 2.0–3.0)

## 2023-05-26 NOTE — Patient Instructions (Signed)
Description   HOLD tomorrow's dose and then START taking warfarin 2 tablets daily except for 1 tablet on Sundays, Wednesdays and Saturdays.  Recheck INR in 2 weeks.  Coumadin Clinic [phone]. Main # [phone]

## 2023-05-31 ENCOUNTER — Other Ambulatory Visit: Payer: Self-pay | Admitting: Internal Medicine

## 2023-05-31 DIAGNOSIS — I4891 Unspecified atrial fibrillation: Secondary | ICD-10-CM

## 2023-06-07 ENCOUNTER — Ambulatory Visit: Payer: POS

## 2023-06-09 ENCOUNTER — Ambulatory Visit: Payer: 59 | Attending: Cardiology

## 2023-06-09 DIAGNOSIS — Z5181 Encounter for therapeutic drug level monitoring: Secondary | ICD-10-CM | POA: Diagnosis not present

## 2023-06-09 DIAGNOSIS — I4891 Unspecified atrial fibrillation: Secondary | ICD-10-CM

## 2023-06-09 LAB — POCT INR: INR: 2.8 (ref 2.0–3.0)

## 2023-06-09 NOTE — Patient Instructions (Signed)
Description   Continue taking warfarin 2 tablets daily except for 1 tablet on Sundays, Wednesdays and Saturdays.  Stay consistent with greens each week (1-2 per week)  Recheck INR in 3 weeks.  Coumadin Clinic [phone]. Main # [phone]

## 2023-06-29 ENCOUNTER — Ambulatory Visit (INDEPENDENT_AMBULATORY_CARE_PROVIDER_SITE_OTHER): Payer: 59

## 2023-06-29 DIAGNOSIS — I4891 Unspecified atrial fibrillation: Secondary | ICD-10-CM

## 2023-06-29 DIAGNOSIS — I495 Sick sinus syndrome: Secondary | ICD-10-CM | POA: Diagnosis not present

## 2023-06-29 LAB — CUP PACEART REMOTE DEVICE CHECK
Battery Remaining Longevity: 180 mo
Battery Remaining Percentage: 100 %
Brady Statistic RA Percent Paced: 0 %
Brady Statistic RV Percent Paced: 38 %
Date Time Interrogation Session: 20241203045100
Implantable Lead Connection Status: 753985
Implantable Lead Connection Status: 753985
Implantable Lead Implant Date: 20050517
Implantable Lead Implant Date: 20050517
Implantable Lead Location: 753859
Implantable Lead Location: 753860
Implantable Lead Model: 4086
Implantable Lead Model: 4087
Implantable Lead Serial Number: 218470
Implantable Lead Serial Number: 237198
Implantable Pulse Generator Implant Date: 20240304
Lead Channel Impedance Value: 701 Ohm
Lead Channel Setting Pacing Amplitude: 2.5 V
Lead Channel Setting Pacing Pulse Width: 0.4 ms
Lead Channel Setting Sensing Sensitivity: 2.5 mV
Pulse Gen Serial Number: 637012
Zone Setting Status: 755011

## 2023-06-30 ENCOUNTER — Ambulatory Visit: Payer: 59 | Attending: Cardiovascular Disease

## 2023-06-30 DIAGNOSIS — Z5181 Encounter for therapeutic drug level monitoring: Secondary | ICD-10-CM | POA: Diagnosis not present

## 2023-06-30 DIAGNOSIS — I4891 Unspecified atrial fibrillation: Secondary | ICD-10-CM

## 2023-06-30 LAB — POCT INR: INR: 2.4 (ref 2.0–3.0)

## 2023-06-30 NOTE — Patient Instructions (Signed)
Description   Continue taking warfarin 2 tablets daily except for 1 tablet on Sundays, Wednesdays and Saturdays.  Stay consistent with greens each week (1-2 per week)  Recheck INR in 4 weeks.  Coumadin Clinic [phone]. Main # [phone]

## 2023-07-29 ENCOUNTER — Ambulatory Visit: Payer: 59 | Attending: Internal Medicine

## 2023-07-29 ENCOUNTER — Telehealth: Payer: Self-pay

## 2023-07-29 NOTE — Telephone Encounter (Signed)
 Pt missed coumadin clinic appt. Called, no answer. Left message on voicemail.

## 2023-08-12 ENCOUNTER — Ambulatory Visit: Payer: Commercial Managed Care - PPO | Attending: Internal Medicine | Admitting: *Deleted

## 2023-08-12 DIAGNOSIS — Z5181 Encounter for therapeutic drug level monitoring: Secondary | ICD-10-CM | POA: Diagnosis not present

## 2023-08-12 DIAGNOSIS — I4891 Unspecified atrial fibrillation: Secondary | ICD-10-CM | POA: Diagnosis not present

## 2023-08-12 LAB — POCT INR: INR: 3.3 — AB (ref 2.0–3.0)

## 2023-09-06 ENCOUNTER — Ambulatory Visit: Payer: POS

## 2023-09-06 ENCOUNTER — Other Ambulatory Visit: Payer: Self-pay | Admitting: Internal Medicine

## 2023-09-06 DIAGNOSIS — I4891 Unspecified atrial fibrillation: Secondary | ICD-10-CM

## 2023-09-09 ENCOUNTER — Ambulatory Visit: Payer: Commercial Managed Care - PPO | Attending: Internal Medicine

## 2023-09-09 DIAGNOSIS — I4891 Unspecified atrial fibrillation: Secondary | ICD-10-CM

## 2023-09-09 DIAGNOSIS — Z5181 Encounter for therapeutic drug level monitoring: Secondary | ICD-10-CM

## 2023-09-09 LAB — POCT INR: INR: 1.9 — AB (ref 2.0–3.0)

## 2023-09-09 NOTE — Patient Instructions (Signed)
Description   Take 3 tablets today, then continue taking warfarin 2 tablets daily except for 1 tablet on Sundays, Wednesdays and Saturdays.  Stay consistent with greens each week (1-2 per week)  Recheck INR in 4 weeks.  Coumadin Clinic [phone]. Main # [phone]

## 2023-09-28 ENCOUNTER — Ambulatory Visit (INDEPENDENT_AMBULATORY_CARE_PROVIDER_SITE_OTHER): Payer: 59

## 2023-09-28 DIAGNOSIS — I495 Sick sinus syndrome: Secondary | ICD-10-CM | POA: Diagnosis not present

## 2023-09-30 ENCOUNTER — Encounter: Payer: Self-pay | Admitting: Internal Medicine

## 2023-09-30 LAB — CUP PACEART REMOTE DEVICE CHECK
Battery Remaining Longevity: 180 mo
Battery Remaining Percentage: 100 %
Brady Statistic RA Percent Paced: 0 %
Brady Statistic RV Percent Paced: 34 %
Date Time Interrogation Session: 20250304045100
Implantable Lead Connection Status: 753985
Implantable Lead Connection Status: 753985
Implantable Lead Implant Date: 20050517
Implantable Lead Implant Date: 20050517
Implantable Lead Location: 753859
Implantable Lead Location: 753860
Implantable Lead Model: 4086
Implantable Lead Model: 4087
Implantable Lead Serial Number: 218470
Implantable Lead Serial Number: 237198
Implantable Pulse Generator Implant Date: 20240304
Lead Channel Impedance Value: 685 Ohm
Lead Channel Setting Pacing Amplitude: 2.5 V
Lead Channel Setting Pacing Pulse Width: 0.4 ms
Lead Channel Setting Sensing Sensitivity: 2.5 mV
Pulse Gen Serial Number: 637012
Zone Setting Status: 755011

## 2023-10-07 ENCOUNTER — Ambulatory Visit: Payer: Commercial Managed Care - PPO | Attending: Cardiology | Admitting: *Deleted

## 2023-10-07 DIAGNOSIS — Z5181 Encounter for therapeutic drug level monitoring: Secondary | ICD-10-CM | POA: Diagnosis not present

## 2023-10-07 DIAGNOSIS — I4891 Unspecified atrial fibrillation: Secondary | ICD-10-CM

## 2023-10-07 LAB — POCT INR: POC INR: 2.7

## 2023-10-07 NOTE — Patient Instructions (Signed)
 Description   Continue taking warfarin 2 tablets daily except for 1 tablet on Sundays, Wednesdays and Saturdays.  Stay consistent with greens each week (1-2 per week)  Recheck INR in 5 weeks.  Coumadin Clinic [phone]. Main # [phone]

## 2023-10-25 ENCOUNTER — Telehealth: Payer: Self-pay | Admitting: Internal Medicine

## 2023-10-25 NOTE — Telephone Encounter (Signed)
   Pre-operative Risk Assessment    Patient Name: Phillip Kline  DOB: Dec 11, 1945 MRN: [id number]      Request for Surgical Clearance    Procedure:   Left inguinal hernia repair   Date of Surgery:  Clearance 12/14/23                                 Surgeon:  Thalia Bloodgood  Surgeon's Group or Practice Name:  [facility]  Phone number:  [phone] Fax number:  [phone]   Type of Clearance Requested:   - Pharmacy:  Hold Warfarin (Coumadin)     Type of Anesthesia:  General    Additional requests/questions:    SignedFrancesca Jewett   10/25/2023, 11:07 AM

## 2023-10-26 NOTE — Telephone Encounter (Signed)
 Tried to call the pt to schedule tele preop appt; though vm full, could not leave message to call back.

## 2023-10-26 NOTE — Telephone Encounter (Signed)
   Name: Phillip Kline  DOB: [birth year]  MRN: [id number]  Primary Cardiologist: Lewayne Bunting, MD  Chart reviewed as part of pre-operative protocol coverage. Because of Phillip Kline past medical history and time since last visit, he will require a follow-up telephone visit in order to better assess preoperative cardiovascular risk.  Pre-op covering staff: - Please schedule appointment and call patient to inform them. If patient already had an upcoming appointment within acceptable timeframe, please add "pre-op clearance" to the appointment notes so provider is aware. - Please contact requesting surgeon's office via preferred method (i.e, phone, fax) to inform them of need for appointment prior to surgery.  Per office protocol, patient can hold warfarin  for 5 days prior to procedure.     Patient will not need bridging with Lovenox (enoxaparin) around procedure.  I also confirmed the patient resides in the state of [place]. As per [facility] Medical Board telemedicine laws, the patient must reside in the state in which the provider is licensed    Sharlene Dory, PA-C  10/26/2023, 11:51 AM

## 2023-10-26 NOTE — Telephone Encounter (Signed)
 Patient with diagnosis of A Fib on warfarin for anticoagulation.    Procedure: Left inguinal hernia repair Date of procedure: 12/14/23   CHA2DS2-VASc Score = 4  This indicates a 4.8% annual risk of stroke. The patient's score is based upon: CHF History: 0 HTN History: 1 Diabetes History: 0 Stroke History: 0 Vascular Disease History: 1 Age Score: 2 Gender Score: 0    CrCl 56 ml/min Platelet count 188K   Per office protocol, patient can hold warfarin  for 5 days prior to procedure.    Patient will not need bridging with Lovenox (enoxaparin) around procedure.  **This guidance is not considered finalized until pre-operative APP has relayed final recommendations.**

## 2023-10-27 ENCOUNTER — Telehealth: Payer: Self-pay

## 2023-10-27 NOTE — Telephone Encounter (Signed)
 Spoke to patient and he has a telephone visit scheduled for 11/05/23 at 11. Med rec and consent done

## 2023-10-27 NOTE — Telephone Encounter (Signed)
  Patient Consent for Virtual Visit        Phillip Kline has provided verbal consent on 10/27/2023 for a virtual visit (video or telephone).   CONSENT FOR VIRTUAL VISIT FOR:  Phillip Kline  By participating in this virtual visit I agree to the following:  I hereby voluntarily request, consent and authorize [facility] and its employed or contracted physicians, physician assistants, nurse practitioners or other licensed health care professionals (the Practitioner), to provide me with telemedicine health care services (the "Services") as deemed necessary by the treating Practitioner. I acknowledge and consent to receive the Services by the Practitioner via telemedicine. I understand that the telemedicine visit will involve communicating with the Practitioner through live audiovisual communication technology and the disclosure of certain medical information by electronic transmission. I acknowledge that I have been given the opportunity to request an in-person assessment or other available alternative prior to the telemedicine visit and am voluntarily participating in the telemedicine visit.  I understand that I have the right to withhold or withdraw my consent to the use of telemedicine in the course of my care at any time, without affecting my right to future care or treatment, and that the Practitioner or I may terminate the telemedicine visit at any time. I understand that I have the right to inspect all information obtained and/or recorded in the course of the telemedicine visit and may receive copies of available information for a reasonable fee.  I understand that some of the potential risks of receiving the Services via telemedicine include:  Delay or interruption in medical evaluation due to technological equipment failure or disruption; Information transmitted may not be sufficient (e.g. poor resolution of images) to allow for appropriate medical decision making by the  Practitioner; and/or  In rare instances, security protocols could fail, causing a breach of personal health information.  Furthermore, I acknowledge that it is my responsibility to provide information about my medical history, conditions and care that is complete and accurate to the best of my ability. I acknowledge that Practitioner's advice, recommendations, and/or decision may be based on factors not within their control, such as incomplete or inaccurate data provided by me or distortions of diagnostic images or specimens that may result from electronic transmissions. I understand that the practice of medicine is not an exact science and that Practitioner makes no warranties or guarantees regarding treatment outcomes. I acknowledge that a copy of this consent can be made available to me via my patient portal [facility] MyChart), or I can request a printed copy by calling the office of [facility].    I understand that my insurance will be billed for this visit.   I have read or had this consent read to me. I understand the contents of this consent, which adequately explains the benefits and risks of the Services being provided via telemedicine.  I have been provided ample opportunity to ask questions regarding this consent and the Services and have had my questions answered to my satisfaction. I give my informed consent for the services to be provided through the use of telemedicine in my medical care

## 2023-11-01 NOTE — Addendum Note (Signed)
 Addended by: Geralyn Flash D on: 11/01/2023 01:47 PM   Modules accepted: Orders

## 2023-11-01 NOTE — Progress Notes (Signed)
 Remote pacemaker transmission.

## 2023-11-04 NOTE — Progress Notes (Unsigned)
 Virtual Visit via Telephone Note   Because of Phillip Kline co-morbid illnesses, he is at least at moderate risk for complications without adequate follow up.  This format is felt to be most appropriate for this patient at this time.  Due to technical limitations with video connection Web designer), today's appointment will be conducted as an audio only telehealth visit, and Phillip Kline verbally agreed to proceed in this manner.   All issues noted in this document were discussed and addressed.  No physical exam could be performed with this format.  Evaluation Performed:  Preoperative cardiovascular risk assessment _____________   Date:  11/04/2023   Patient ID:  Phillip Kline, DOB 09-03-1945, MRN [id number] Patient Location:  Home Provider location:   Office  Primary Care Provider:  [facility] Primary Cardiologist:  Lewayne Bunting, MD  Chief Complaint / Patient Profile   78 y.o. y/o male with a h/o PAF, HTN, CAD, sinus node dysfunction s/p PPM, OSA (on CPAP) who is pending left inguinal hernia repair and presents today for telephonic preoperative cardiovascular risk assessment.  History of Present Illness    Phillip Kline is a 78 y.o. male who presents via audio/video conferencing for a telehealth visit today.  Pt was last seen in cardiology clinic on 01/05/2023 by Dr. Ladona Ridgel.  At that time Phillip Kline was doing well with no chest pain and has been exercising with no shortness of breath or syncope.  Patient's blood pressures were well-controlled and no medication changes were made during visit.  The patient is now pending procedure as outlined above. Since his last visit, he ***  *** denies chest pain, shortness of breath, lower extremity edema, fatigue, palpitations, melena, hematuria, hemoptysis, diaphoresis, weakness, presyncope, syncope, orthopnea, and PND.   Per office protocol, patient can hold warfarin  for 5 days prior to procedure.     Patient will  not need bridging with Lovenox (enoxaparin) around procedure.  -Make sure to notify device clinic  Past Medical History    Past Medical History:  Diagnosis Date   A-fib (HCC)    Abdominal hernia    Anxiety    Arthritis    Atrial fibrillation (HCC)    Atrial flutter (HCC)    Blood transfusion without reported diagnosis    Cataract    bilateral cataracts removed   Clotting disorder (HCC)    coumadin - a fib   Coronary artery disease    Depression    Hyperlipidemia    Hypertension    Pacemaker    [employer]   Sinus bradycardia    Sleep apnea    wears a c-pap   Past Surgical History:  Procedure Laterality Date   CATARACT EXTRACTION     COLONOSCOPY     hernia repair     PACEMAKER INSERTION     PERMANENT PACEMAKER GENERATOR CHANGE N/A 08/07/2013   Procedure: PERMANENT PACEMAKER GENERATOR CHANGE;  Surgeon: Marinus Maw, MD;  Location: [facility] CATH LAB;  Service: Cardiovascular;  Laterality: N/A;   PERMANENT PACEMAKER INSERTION  2005; 08/07/2013   BSX dual chamber pacemaker implanted 2005 for symptomatic bradycardia; gen change 2015 by Dr Ladona Ridgel   [facility] GENERATOR CHANGEOUT N/A 09/28/2022   Procedure: PPM GENERATOR CHANGEOUT;  Surgeon: Marinus Maw, MD;  Location: [facility] INVASIVE CV LAB;  Service: Cardiovascular;  Laterality: N/A;   REPLACEMENT TOTAL KNEE     right knee   thumb surgery     left and right   TONSILLECTOMY  Allergies  No Known Allergies  Home Medications    Prior to Admission medications   Medication Sig Start Date End Date Taking? Authorizing Provider  acetaminophen (TYLENOL) 325 MG tablet Take 2 tablets (650 mg total) by mouth every 6 (six) hours as needed for fever. Patient taking differently: Take 500 mg by mouth every 6 (six) hours as needed for fever. 04/02/18   Focht, Joyce Copa, PA  atorvastatin (LIPITOR) 80 MG tablet Take 40 mg by mouth at bedtime.     [provider]  Calcium Carbonate-Vitamin D 500-125 MG-UNIT TABS Take 1 tablet by  mouth 2 (two) times daily.    [provider]  digoxin (LANOXIN) 0.125 MG tablet TAKE 1 TABLET BY MOUTH DAILY. PLEASE KEEP NOVEMBER APPT TO GET MORE REFILLS. Patient not taking: Reported on 10/27/2023 07/24/19   Marinus Maw, MD  hydrochlorothiazide (MICROZIDE) 12.5 MG capsule Take 12.5 mg by mouth daily.    [provider]  lisinopril (ZESTRIL) 2.5 MG tablet Take 2.5 mg by mouth daily. 08/12/22   [provider]  QUEtiapine (SEROQUEL) 25 MG tablet Take 12.5 mg by mouth at bedtime.     [provider]  sertraline (ZOLOFT) 100 MG tablet Take 200 mg by mouth daily.     [provider]  sildenafil (VIAGRA) 50 MG tablet Take by mouth as needed. 07/16/20   [provider]  thiamine 100 MG tablet Take 100 mg by mouth daily.    [provider]  verapamil (CALAN-SR) 180 MG CR tablet Take 1 tablet by mouth daily. 09/25/15   [provider]  vitamin B-12 (CYANOCOBALAMIN) 500 MCG tablet Take 500 mcg by mouth 2 (two) times daily.    [provider]  warfarin (COUMADIN) 2 MG tablet TAKE 1 TO 2 TABLETS BY MOUTH DAILY AS DIRECTED BY COUMADIN CLINIC 09/07/23   Marinus Maw, MD    Physical Exam    Vital Signs:  Phillip Kline does not have vital signs available for review today.***  Given telephonic nature of communication, physical exam is limited. AAOx3. NAD. Normal affect.  Speech and respirations are unlabored.  Accessory Clinical Findings    None  Assessment & Plan    1.  Preoperative Cardiovascular Risk Assessment: -Patient's RCRI score is 0.9%  The patient was advised that if he develops new symptoms prior to surgery to contact our office to arrange for a follow-up visit, and he verbalized understanding.  (Reminder: Include SBE prophylaxis/Antiplatelet/Anticoag Instructions***)  A copy of this note will be routed to requesting surgeon.  Time:   Today, I have spent *** minutes with the patient with telehealth  technology discussing medical history, symptoms, and management plan.     Napoleon Form, Leodis Rains, NP  11/04/2023, 6:16 PM

## 2023-11-05 ENCOUNTER — Ambulatory Visit: Attending: Cardiology

## 2023-11-05 ENCOUNTER — Telehealth: Payer: Self-pay | Admitting: Nurse Practitioner

## 2023-11-05 DIAGNOSIS — Z01810 Encounter for preprocedural cardiovascular examination: Secondary | ICD-10-CM

## 2023-11-05 NOTE — Telephone Encounter (Signed)
 Call placed this morning at scheduled appointment visit time of 11:00 for preoperative clearance.  There was no answer and patient's phone is unable to accept voicemail at this time.  We will continue to try to reach patient and if unsuccessful will need to reschedule visit at another time.  Robin Searing, NP

## 2023-11-11 ENCOUNTER — Ambulatory Visit: Attending: Cardiology

## 2023-11-11 DIAGNOSIS — I4891 Unspecified atrial fibrillation: Secondary | ICD-10-CM

## 2023-11-11 LAB — POCT INR: INR: 1.6 — AB (ref 2.0–3.0)

## 2023-11-11 NOTE — Patient Instructions (Addendum)
 Description   Take 3 tablets today and then Continue taking warfarin 2 tablets daily except for 1 tablet on Sundays, Wednesdays and Saturdays.  Stay consistent with greens each week (1-2 per week)  Recheck INR in 4 weeks.  Coumadin Clinic [phone]. Main # [phone]

## 2023-12-01 ENCOUNTER — Other Ambulatory Visit (HOSPITAL_COMMUNITY): Payer: Self-pay

## 2023-12-01 ENCOUNTER — Telehealth: Payer: Self-pay | Admitting: Internal Medicine

## 2023-12-01 DIAGNOSIS — I4891 Unspecified atrial fibrillation: Secondary | ICD-10-CM

## 2023-12-01 MED ORDER — WARFARIN SODIUM 2 MG PO TABS
2.0000 mg | ORAL_TABLET | Freq: Every day | ORAL | 2 refills | Status: DC
  Filled 2023-12-01: qty 60, 30d supply, fill #0

## 2023-12-01 NOTE — Telephone Encounter (Signed)
*  STAT* If patient is at the pharmacy, call can be transferred to refill team.   1. Which medications need to be refilled? (please list name of each medication and dose if known)   warfarin (COUMADIN ) 2 MG tablet     4. Which pharmacy/location (including street and city if local pharmacy) is medication to be sent to?[id number] - [place], [place] - [address] AT [facility] OF [address]    5. Do they need a 30 day or 90 day supply? 90

## 2023-12-01 NOTE — Telephone Encounter (Signed)
 Refill request for warfarin:  Last INR was 1.6 on 11/11/23 Next INR due 12/07/23 LOV was 01/05/23  Refill approved.

## 2023-12-02 ENCOUNTER — Encounter: Payer: Self-pay | Admitting: Gastroenterology

## 2023-12-07 ENCOUNTER — Ambulatory Visit: Attending: Internal Medicine

## 2023-12-07 DIAGNOSIS — I4891 Unspecified atrial fibrillation: Secondary | ICD-10-CM | POA: Diagnosis not present

## 2023-12-07 DIAGNOSIS — Z5181 Encounter for therapeutic drug level monitoring: Secondary | ICD-10-CM

## 2023-12-07 LAB — POCT INR: INR: 2.1 (ref 2.0–3.0)

## 2023-12-07 NOTE — Patient Instructions (Signed)
 Continue taking warfarin 2 tablets daily except for 1 tablet on Sundays, Wednesdays and Saturdays. Hernia Repair 5/20 Holding coumadin  5/15-19 Stay consistent with greens each week (1-2 per week)  Recheck INR in 3 weeks.  Coumadin  Clinic [phone]. Main # [phone]

## 2023-12-10 ENCOUNTER — Other Ambulatory Visit (HOSPITAL_COMMUNITY): Payer: Self-pay

## 2023-12-13 ENCOUNTER — Other Ambulatory Visit (HOSPITAL_COMMUNITY): Payer: Self-pay

## 2023-12-27 NOTE — Telephone Encounter (Signed)
 [facility] [place] [facility]  to fax the clearance to [phone]. The [facility] requested for us  to write our fax number on the fax we send to them. They are wanting to send recent Echo results due to seeing changes from the last Echo. They are requesting for Dr. Carolynne Citron to review the results. Please advise.

## 2023-12-27 NOTE — Telephone Encounter (Signed)
 Will route over preop clearance notes that surgeons office is requesting and will give them our fax number to fax over Echo results.

## 2023-12-28 ENCOUNTER — Ambulatory Visit (INDEPENDENT_AMBULATORY_CARE_PROVIDER_SITE_OTHER): Payer: 59

## 2023-12-28 DIAGNOSIS — I495 Sick sinus syndrome: Secondary | ICD-10-CM

## 2023-12-29 ENCOUNTER — Ambulatory Visit: Attending: Internal Medicine

## 2023-12-29 ENCOUNTER — Telehealth: Payer: Self-pay | Admitting: *Deleted

## 2023-12-29 LAB — CUP PACEART REMOTE DEVICE CHECK
Battery Remaining Longevity: 180 mo
Battery Remaining Percentage: 100 %
Brady Statistic RA Percent Paced: 0 %
Brady Statistic RV Percent Paced: 28 %
Date Time Interrogation Session: 20250603050200
Implantable Lead Connection Status: 753985
Implantable Lead Connection Status: 753985
Implantable Lead Implant Date: 20050517
Implantable Lead Implant Date: 20050517
Implantable Lead Location: 753859
Implantable Lead Location: 753860
Implantable Lead Model: 4086
Implantable Lead Model: 4087
Implantable Lead Serial Number: 218470
Implantable Lead Serial Number: 237198
Implantable Pulse Generator Implant Date: 20240304
Lead Channel Impedance Value: 654 Ohm
Lead Channel Setting Pacing Amplitude: 2.5 V
Lead Channel Setting Pacing Pulse Width: 0.4 ms
Lead Channel Setting Sensing Sensitivity: 2.5 mV
Pulse Gen Serial Number: 637012
Zone Setting Status: 755011

## 2023-12-29 NOTE — Telephone Encounter (Signed)
 Called pt since he missed his appointment this morning. There was no answer and no voicemail. Will call back later.

## 2023-12-30 ENCOUNTER — Other Ambulatory Visit (INDEPENDENT_AMBULATORY_CARE_PROVIDER_SITE_OTHER)

## 2023-12-30 ENCOUNTER — Ambulatory Visit (INDEPENDENT_AMBULATORY_CARE_PROVIDER_SITE_OTHER)
Admission: RE | Admit: 2023-12-30 | Discharge: 2023-12-30 | Disposition: A | Discharge status: Home / Self Care | Source: Ambulatory Visit | Attending: Gastroenterology | Admitting: Gastroenterology

## 2023-12-30 ENCOUNTER — Encounter: Payer: Self-pay | Admitting: Gastroenterology

## 2023-12-30 ENCOUNTER — Ambulatory Visit (INDEPENDENT_AMBULATORY_CARE_PROVIDER_SITE_OTHER): Admitting: Gastroenterology

## 2023-12-30 ENCOUNTER — Ambulatory Visit: Payer: Self-pay | Admitting: Internal Medicine

## 2023-12-30 VITALS — BP 118/68 | HR 71 | Ht 64.0 in | Wt 142.6 lb

## 2023-12-30 DIAGNOSIS — K5900 Constipation, unspecified: Secondary | ICD-10-CM

## 2023-12-30 DIAGNOSIS — I350 Nonrheumatic aortic (valve) stenosis: Secondary | ICD-10-CM | POA: Diagnosis not present

## 2023-12-30 DIAGNOSIS — Z5181 Encounter for therapeutic drug level monitoring: Secondary | ICD-10-CM

## 2023-12-30 DIAGNOSIS — Z7901 Long term (current) use of anticoagulants: Secondary | ICD-10-CM

## 2023-12-30 DIAGNOSIS — R194 Change in bowel habit: Secondary | ICD-10-CM

## 2023-12-30 DIAGNOSIS — R195 Other fecal abnormalities: Secondary | ICD-10-CM | POA: Diagnosis not present

## 2023-12-30 DIAGNOSIS — I4891 Unspecified atrial fibrillation: Secondary | ICD-10-CM | POA: Diagnosis not present

## 2023-12-30 DIAGNOSIS — K921 Melena: Secondary | ICD-10-CM | POA: Diagnosis not present

## 2023-12-30 DIAGNOSIS — D509 Iron deficiency anemia, unspecified: Secondary | ICD-10-CM

## 2023-12-30 DIAGNOSIS — Z950 Presence of cardiac pacemaker: Secondary | ICD-10-CM

## 2023-12-30 LAB — VITAMIN B12: Vitamin B-12: 884 pg/mL (ref 211–911)

## 2023-12-30 LAB — CBC WITH DIFFERENTIAL/PLATELET
Basophils Absolute: 0 10*3/uL (ref 0.0–0.1)
Basophils Relative: 0.7 % (ref 0.0–3.0)
Eosinophils Absolute: 0.2 10*3/uL (ref 0.0–0.7)
Eosinophils Relative: 3 % (ref 0.0–5.0)
HCT: 34.8 % — ABNORMAL LOW (ref 39.0–52.0)
Hemoglobin: 11.2 g/dL — ABNORMAL LOW (ref 13.0–17.0)
Lymphocytes Relative: 15.9 % (ref 12.0–46.0)
Lymphs Abs: 1 10*3/uL (ref 0.7–4.0)
MCHC: 32 g/dL (ref 30.0–36.0)
MCV: 85.2 fl (ref 78.0–100.0)
Monocytes Absolute: 0.8 10*3/uL (ref 0.1–1.0)
Monocytes Relative: 12.4 % — ABNORMAL HIGH (ref 3.0–12.0)
Neutro Abs: 4.2 10*3/uL (ref 1.4–7.7)
Neutrophils Relative %: 68 % (ref 43.0–77.0)
Platelets: 188 10*3/uL (ref 150.0–400.0)
RBC: 4.09 Mil/uL — ABNORMAL LOW (ref 4.22–5.81)
RDW: 21.1 % — ABNORMAL HIGH (ref 11.5–15.5)
WBC: 6.1 10*3/uL (ref 4.0–10.5)

## 2023-12-30 LAB — COMPREHENSIVE METABOLIC PANEL WITH GFR
ALT: 13 U/L (ref 0–53)
AST: 18 U/L (ref 0–37)
Albumin: 4.2 g/dL (ref 3.5–5.2)
Alkaline Phosphatase: 71 U/L (ref 39–117)
BUN: 26 mg/dL — ABNORMAL HIGH (ref 6–23)
CO2: 27 meq/L (ref 19–32)
Calcium: 9.5 mg/dL (ref 8.4–10.5)
Chloride: 102 meq/L (ref 96–112)
Creatinine, Ser: 1.02 mg/dL (ref 0.40–1.50)
GFR: 70.52 mL/min (ref >60.00)
Glucose, Bld: 94 mg/dL (ref 70–99)
Potassium: 4.2 meq/L (ref 3.5–5.1)
Sodium: 140 meq/L (ref 135–145)
Total Bilirubin: 1 mg/dL (ref 0.2–1.2)
Total Protein: 7.1 g/dL (ref 6.0–8.3)

## 2023-12-30 LAB — IBC + FERRITIN
Ferritin: 53.3 ng/mL (ref 22.0–322.0)
Iron: 37 ug/dL — ABNORMAL LOW (ref 42–165)
Saturation Ratios: 6.8 % — ABNORMAL LOW (ref 20.0–50.0)
TIBC: 547.4 ug/dL — ABNORMAL HIGH (ref 250.0–450.0)
Transferrin: 391 mg/dL — ABNORMAL HIGH (ref 212.0–360.0)

## 2023-12-30 LAB — FOLATE: Folate: 7.6 ng/mL (ref >5.9)

## 2023-12-30 NOTE — Progress Notes (Signed)
 Phillip Kline [id number] 26-Mar-1946   Chief Complaint: Iron deficiency anemia  Referring Provider: [facility] Primary GI MD: Para Bold (previous Dr. Sandrea Cruel)  HPI: Phillip Kline is a 78 y.o. male with past medical history of A-fib on chronic anticoagulation, pacemaker in place, sleep apnea on CPAP, CAD, HLD, HTN who presents today to discuss EGD/colonoscopy.    Patient last seen in the office 08/18/2016 by Dr. Sandrea Cruel to discuss colonoscopy with history of colon polyps, on chronic anticoagulation for history of A-fib s/p pacemaker.  Per note from the [place], patient is being referred for consideration of EGD/colonoscopy in workup for iron deficiency anemia with positive hemoccult.  They report he has had a slowly downtrending hemoglobin and hematocrit since 2023.  Reports melenic stools.  He is on Coumadin  for atrial fibrillation.  Follows with [facility] cardiology.  Labs 09/23/2022: Hemoglobin 13.2, hematocrit 40.6, MCV 96, platelets 198   Patient states he has been feeling more fatigued/run down over the last 3 months.  Has also been noticing dark stools/melena in that amount of time.  Consistency of stools varies.  Sometimes he passes small hard stools, sometimes can be loose but denies watery diarrhea.  Occasionally after eating he will have a sense of fecal urgency and have loose stools.  He denies any bright red blood in his stool.  Denies constipation, though he does say he has to strain occasionally with bowel movements.  Denies rectal pain.  He denies abdominal pain, nausea, vomiting, heartburn, acid reflux, dysphagia.  He reports intentional weight loss of about 60 pounds over the last few years to help alleviate pressure on his knee.  He has been taking iron supplement for the last couple of weeks, taking every other day.  Denies use of Pepto-Bismol.  Denies NSAID use, takes Tylenol  as needed.  He has a left inguinal hernia which he is planning to have surgically corrected  later this month.  Denies prior EGD.  Smokes marijuana, drinks alcohol 2 nights a week.  Denies family history of colon or stomach cancer.   Has some shortness of breath since having pacemaker, worse with exertion.  Denies history of MI/stroke.  Previous GI Procedures/Imaging   Colonoscopy 09/29/2016 - Two 3 to 5 mm polyps in the rectum, removed with a cold biopsy forceps. Resected and retrieved.  - Internal hemorrhoids.  - Moderate diverticulosis in the left colon.  - The examination was otherwise normal on direct and retroflexion views. - No recall due to age Path:  Surgical [P], rectum, polyp (2) - HYPERPLASTIC POLYP (X2 FRAGMENTS). - NO DYSPLASIA OR MALIGNANCY.  Colonoscopy 08/26/2006 (Dr. [facility]in Corp) - Hyperplastic colon polyps, diverticulosis  Past Medical History:  Diagnosis Date   A-fib (HCC)    Abdominal hernia    Anxiety    Arthritis    Atrial fibrillation (HCC)    Atrial flutter (HCC)    Blood transfusion without reported diagnosis    Cataract    bilateral cataracts removed   Clotting disorder (HCC)    coumadin  - a fib   Coronary artery disease    Depression    Hyperlipidemia    Hypertension    Pacemaker    [employer]   Sinus bradycardia    Sleep apnea    wears a c-pap    Past Surgical History:  Procedure Laterality Date   CATARACT EXTRACTION     COLONOSCOPY     hernia repair     PACEMAKER INSERTION     PERMANENT PACEMAKER GENERATOR  CHANGE N/A 08/07/2013   Procedure: PERMANENT PACEMAKER GENERATOR CHANGE;  Surgeon: Tammie Fall, MD;  Location: [facility] CATH LAB;  Service: Cardiovascular;  Laterality: N/A;   PERMANENT PACEMAKER INSERTION  2005; 08/07/2013   BSX dual chamber pacemaker implanted 2005 for symptomatic bradycardia; gen change 2015 by Dr Carolynne Citron   [facility] GENERATOR CHANGEOUT N/A 09/28/2022   Procedure: PPM GENERATOR CHANGEOUT;  Surgeon: Tammie Fall, MD;  Location: [facility] INVASIVE CV LAB;  Service: Cardiovascular;  Laterality: N/A;   REPLACEMENT  TOTAL KNEE     right knee   thumb surgery     left and right   TONSILLECTOMY      Current Outpatient Medications  Medication Sig Dispense Refill   acetaminophen  (TYLENOL ) 325 MG tablet Take 2 tablets (650 mg total) by mouth every 6 (six) hours as needed for fever. (Patient taking differently: Take 500 mg by mouth every 6 (six) hours as needed for fever.)     atorvastatin  (LIPITOR) 80 MG tablet Take 40 mg by mouth at bedtime.      Calcium  Carbonate-Vitamin D  500-125 MG-UNIT TABS Take 1 tablet by mouth 2 (two) times daily.     digoxin  (LANOXIN ) 0.125 MG tablet TAKE 1 TABLET BY MOUTH DAILY. PLEASE KEEP NOVEMBER APPT TO GET MORE REFILLS. (Patient not taking: Reported on 10/27/2023) 90 tablet 3   hydrochlorothiazide  (MICROZIDE ) 12.5 MG capsule Take 12.5 mg by mouth daily.     lisinopril  (ZESTRIL ) 2.5 MG tablet Take 2.5 mg by mouth daily.     QUEtiapine  (SEROQUEL ) 25 MG tablet Take 12.5 mg by mouth at bedtime.      sertraline  (ZOLOFT ) 100 MG tablet Take 200 mg by mouth daily.      sildenafil (VIAGRA) 50 MG tablet Take by mouth as needed.     thiamine  100 MG tablet Take 100 mg by mouth daily.     verapamil  (CALAN -SR) 180 MG CR tablet Take 1 tablet by mouth daily.     vitamin B-12 (CYANOCOBALAMIN ) 500 MCG tablet Take 500 mcg by mouth 2 (two) times daily.     warfarin (COUMADIN ) 2 MG tablet Take 1-2 tablets (2-4 mg total) by mouth daily as directed by the coumadin  clinic. 60 tablet 2   No current facility-administered medications for this visit.    Allergies as of 12/30/2023   (No Known Allergies)    Family History  Problem Relation Age of Onset   Cancer Father        lung cancer   Stomach cancer Mother    Stomach cancer Sister    Colon cancer Neg Hx    Esophageal cancer Neg Hx    Pancreatic cancer Neg Hx    Prostate cancer Neg Hx    Rectal cancer Neg Hx     Social History   Tobacco Use   Smoking status: Never   Smokeless tobacco: Never   Tobacco comments:    Marijuana  Vaping  Use   Vaping status: Never Used  Substance Use Topics   Alcohol use: Yes    Comment: 4-5 beers weekly   Drug use: Not Currently    Types: Marijuana    Comment: smoked marijuana 09-26-16     Review of Systems:    Constitutional: No unexplained weight loss, fever, chills. Positive fatigue. Skin: No rash or itching Cardiovascular: No chest pain, chest pressure or palpitations   Respiratory: No cough. Positive shortness of breath with exertion which is not new. Gastrointestinal: See HPI and otherwise negative Genitourinary: No dysuria or  change in urinary frequency Neurological: No headache, dizziness or syncope Musculoskeletal: No new muscle or joint pain    Physical Exam:  Vital signs: BP 118/68   Pulse 71   Ht 5\' 4"  (1.626 m)   Wt 142 lb 9 oz (64.7 kg)   SpO2 97%   BMI 24.47 kg/m    Constitutional: NAD, alert and cooperative Head:  Normocephalic and atraumatic.  Eyes: No scleral icterus.  Respiratory: Respirations even and unlabored. Lungs clear to auscultation bilaterally.  No wheezes, crackles, or rhonchi.  Cardiovascular:  Regular rate and rhythm. Systolic murmur. No peripheral edema. Gastrointestinal:  Soft, nondistended, nontender. No rebound or guarding. Normal bowel sounds. No appreciable masses or hepatomegaly. Rectal:  Benign-appearing follicular changes of perianal area without edema or erythema. No hemorrhoids or fissures. Firm stool in rectum which is dark brown and heme-negative. Chaperone present for exam. Exam also performed by Dr. Dominic Friendly in office today. Neurologic:  Alert and oriented x4;  grossly normal neurologically.  Skin:   Dry and intact without significant lesions or rashes. Psychiatric: Oriented to person, place and time. Demonstrates good judgement and reason without abnormal affect or behaviors.   RELEVANT LABS AND IMAGING: CBC    Component Value Date/Time   WBC 6.8 09/23/2022 1452   WBC 9.2 04/03/2020 1350   RBC 4.23 09/23/2022 1452   RBC 3.14  (L) 04/03/2020 1350   HGB 13.2 09/23/2022 1452   HCT 40.6 09/23/2022 1452   PLT 198 09/23/2022 1452   MCV 96 09/23/2022 1452   MCH 31.2 09/23/2022 1452   MCH 33.4 04/03/2020 1350   MCHC 32.5 09/23/2022 1452   MCHC 32.7 04/03/2020 1350   RDW 13.1 09/23/2022 1452   LYMPHSABS 1.5 04/03/2020 1350   MONOABS 1.2 (H) 04/03/2020 1350   EOSABS 0.1 04/03/2020 1350   BASOSABS 0.1 04/03/2020 1350    CMP     Component Value Date/Time   NA 141 09/23/2022 1452   K 4.7 09/23/2022 1452   CL 99 09/23/2022 1452   CO2 27 09/23/2022 1452   GLUCOSE 79 09/23/2022 1452   GLUCOSE 115 (H) 04/03/2020 1350   BUN 25 09/23/2022 1452   CREATININE 1.35 (H) 09/23/2022 1452   CALCIUM  9.4 09/23/2022 1452   PROT 6.7 03/30/2018 1554   ALBUMIN 3.8 03/30/2018 1554   AST 29 03/30/2018 1554   ALT 17 03/30/2018 1554   ALKPHOS 64 03/30/2018 1554   BILITOT 1.0 03/30/2018 1554   GFRNONAA >60 04/03/2020 1350   GFRAA >60 04/03/2020 1350   Echocardiogram 09/24/2022 1. Left ventricular ejection fraction, by estimation, is 55 to 60% . The left ventricle has normal function. The left ventricle has no regional wall motion abnormalities. There is mild left ventricular hypertrophy. Left ventricular diastolic parameters are indeterminate.  2. Right ventricular systolic function is mildly reduced. The right ventricular size is normal. There is moderately elevated pulmonary artery systolic pressure. The estimated right ventricular systolic pressure is 50. 5 mmHg. 3. Left atrial size was massively dilated.  4. Right atrial size was severely dilated.  5. 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 Moderate to severe mitral valve regurgitation, predominant mechanism likely atrial functional. No evidence of mitral stenosis. Severe mitral annular calcification.  6. Tricuspid valve regurgitation is severe.  7. The aortic valve is abnormal. Unable to determine aortic valve morphology due to  image quality. There is moderate calcification of the aortic valve. There is moderate thickening of the aortic valve. Aortic valve  regurgitation is not visualized. Aortic valve stenosis present, with SVI of 24 ml/ m2. Cusp excursion is moderate- severely reduced. Low flow low gradient AS, likely moderate AS. Aortic valve area, by VTI measures 0. 94 cm . Aortic valve mean gradient measures 11. 0 mmHg. Aortic valve Vmax measures 2. 26 m/ s. DVI 0. 3.  8. Pulmonic valve regurgitation is moderate.  9. The inferior vena cava is dilated in size with < 50% respiratory variability, suggesting right atrial pressure of 15 mmHg. 10. Evidence of atrial level shunting detected by color flow Doppler. There is a small patent foramen ovale with predominantly left to right shunting across the atrial septum. 11. Rhythm strip during this exam demonstrates atrial flutter.  Per note from [facility], echocardiogram performed 11/2023 with the following findings: There is mild concentric left ventricular hypertrophy. Preserved LV systolic function. EF 52% by 2D biplane method. Septal motion consistent with conduction abnormality. Diastolic function not well-evaluated due to arrhythmia. There is a cardiac device lead in the right ventricle. The left atrium is severely dilated. The right atrium is moderately dilated. A dilated inferior vena cava suggests increased right atrial pressure. Moderate to severe valvular aortic stenosis. The peak aortic valve velocity 304 cm/s The calculated aortic valve area using the continuity equation is 0.7 cm. Aortic max pressure gradient equals 37 mmHg. Aortic mean pressure gradient equals 20 mmHg. There is moderate to severe mitral regurgitation. There is mild to moderate mitral annular calcification. The mean gradient across the mitral valve is 3 mmHg. There is mild to moderate tricuspid regurgitation. Right ventricular systolic pressure is estimated at 55 to 60 mmHg. There is  severe pulmonary hypertension. Ascending aorta measures 3.6 cm. Borderline dilated ascending aorta. There is no comparison study available. Clinical correlation is recommended.   Assessment/Plan:   Iron deficiency anemia Melena Atrial fibrillation on chronic anticoagulation Aortic stenosis Pacemaker Patient referred for consideration of EGD/colonoscopy and evaluation of iron deficiency anemia.  We do not have recent blood work, but patient had hemoglobin of 13.2 on 09/23/2022.  He reports fatigue, dark stools, and per referral note has had Hemoccult positive stool, though he is heme-negative on exam today with firm dark brown stool in the rectum.    Patient at increased risk with procedures due to aortic stenosis and pulmonary hypertension and would require hospital procedure, possibly as [facility], with cardiac clearance.  - Will get updated labs today: CBC, CMP, iron/ferritin, B12, folate - Pending lab findings we will reevaluate need for endoscopic procedures in light of patient's cardiovascular disease.  Alternating constipation and loose stools Patient reports occasional hard/small stools and straining, as well as fecal urgency and loose stools sometimes after eating.  Firm stool in rectum today on exam.  Suspect constipation with possible overflow.  -Will order abdominal x-ray to evaluate stool burden and start MiraLAX if evidence of constipation.   Case and plan discussed in office with Dr. Dominic Friendly today.  Valiant Gaul, PA-C [facility] 12/30/2023, 8:27 AM  Patient Care Team: [facility] as PCP - General Tammie Fall, MD as PCP - Cardiology (Cardiology) Tammie Fall, MD as PCP - Electrophysiology (Cardiology) Loreen Roers, MD (Family Medicine)

## 2023-12-30 NOTE — Patient Instructions (Addendum)
 Your provider has requested that you go to the basement level for lab work before leaving today. Press "B" on the elevator. The lab is located at the first door on the left as you exit the elevator..  Your provider has requested that you have an abdominal x ray before leaving today. Please go to the basement floor to our Radiology department for the test.   You are schedule for a follow up visit on 02/10/24 with Dr Dominic Friendly at 10:20 am  _______________________________________________________  If your blood pressure at your visit was 140/90 or greater, please contact your primary care physician to follow up on this.  _______________________________________________________  If you are age 78 or older, your body mass index should be between 23-30. Your Body mass index is 24.47 kg/m. If this is out of the aforementioned range listed, please consider follow up with your Primary Care Provider.  If you are age 63 or younger, your body mass index should be between 19-25. Your Body mass index is 24.47 kg/m. If this is out of the aformentioned range listed, please consider follow up with your Primary Care Provider.   ________________________________________________________  The [facility] providers would like to encourage you to use MYCHART to communicate with providers for non-urgent requests or questions.  Due to long hold times on the telephone, sending your provider a message by [facility] may be a faster and more efficient way to get a response.  Please allow 48 business hours for a response.  Please remember that this is for non-urgent requests.  _______________________________________________________  Due to recent changes in healthcare laws, you may see the results of your imaging and laboratory studies on MyChart before your provider has had a chance to review them.  We understand that in some cases there may be results that are confusing or concerning to you. Not all laboratory results come back in  the same time frame and the provider may be waiting for multiple results in order to interpret others.  Please give us  48 hours in order for your provider to thoroughly review all the results before contacting the office for clarification of your results.   Thank you for entrusting me with your care and choosing [facility].  Valiant Gaul PA-C

## 2024-01-04 ENCOUNTER — Ambulatory Visit: Payer: Self-pay | Admitting: Gastroenterology

## 2024-01-04 NOTE — Progress Notes (Signed)
 ____________________________________________________________  Attending physician addendum:  Thank you for sending this case to me and for discussing it with me in clinic that day. I have reviewed the entire note and agree with the plan.  Unfortunate that referring [facility] provider did not send more recent labs and other relevant clinical information to help answer this question.  Reassured that he was heme-negative at the time of our evaluation.  Although he is anemic with a hemoglobin of 11.4 with a low iron saturation on your labs, the normal MCV, normal iron and ferritin levels suggest there were other contributing factors to this anemia.  B12 and folate were normal.  I agree we are not yet ready to proceed with any endoscopic procedures until we can determine the best timing and location (i.e. [facility] outpatient Endo lab), and he needs an updated preprocedure cardiology evaluation.  He has a visit with me next month, and I will send a note to his [place] cardiologist to help facilitate a visit with them.  Lorella Roles, MD  ____________________________________________________________

## 2024-01-06 NOTE — Telephone Encounter (Signed)
 Attempted to reach by phone x3. Letter sent to patient to call for test results.

## 2024-01-07 ENCOUNTER — Telehealth: Payer: Self-pay

## 2024-01-07 NOTE — Telephone Encounter (Signed)
 LMTCB in regards to calling cardiology.

## 2024-01-07 NOTE — Telephone Encounter (Signed)
-----   Message from Kerby Pearson III sent at 01/05/2024  3:26 PM EDT ----- Seen by PA in clinic last week, has OV with me in July. I routed the note to his cardiologist requesting they facilitate a clinic appointment with them before we consider any endoscopic procedures.  Have note yet rec'd reply from cardiologist, but has only been a couple of days.  Please contact the patient and ask him to call [facility] Cardiology this week to arrange a visit with them prior to his July visit with me.  Thank you  - H. Danis

## 2024-01-10 NOTE — Telephone Encounter (Signed)
 Pt informed via VM to call his cardiologist to schedule appointment prior to July appt with Dr Dominic Friendly. Left clinic number to call back if pt has any questions.   Electronically signed:  Londell River, NCMA

## 2024-01-20 ENCOUNTER — Telehealth: Payer: Self-pay | Admitting: Gastroenterology

## 2024-01-20 NOTE — Telephone Encounter (Signed)
 Returned call to patient. Discussed lab results and abdominal xray results. Patient states he is still having difficulty with constipation. Discussed starting Miralax 1 capful per day and encouraged patient to drink plenty of hydrating liquids while taking this medication. Patient was not aware that he had an upcoming appointment with Dr. Legrand or the cardiology office. Discussed dates and arrival times of both of these appointments with patient. He verbalized both.

## 2024-01-20 NOTE — Telephone Encounter (Signed)
 Inbound call from patient returning phone calls and letter that he received regarding 6/5 lab results. Requesting a call back. Please advise, thank you

## 2024-01-24 ENCOUNTER — Ambulatory Visit: Payer: Self-pay | Admitting: Internal Medicine

## 2024-02-02 NOTE — Progress Notes (Unsigned)
  Electrophysiology Office Note:   Date:  02/02/2024  ID:  Phillip Kline, DOB 1946/01/15, MRN [id number]  Primary Cardiologist: Danelle Birmingham, MD Primary Heart Failure: None Electrophysiologist: Danelle Birmingham, MD   {Click to update primary MD,subspecialty MD or APP then REFRESH:1}    History of Present Illness:   Phillip Kline is a 78 y.o. male with h/o SND s/p PPM, AF, HTN, CAD seen today for routine electrophysiology followup.   Since last being seen in our clinic the patient reports doing ***.    He denies chest pain, palpitations, dyspnea, PND, orthopnea, nausea, vomiting, dizziness, syncope, edema, weight gain, or early satiety.   Review of systems complete and found to be negative unless listed in HPI.   EP Information / Studies Reviewed:    EKG is ordered today. Personal review as below.      PPM Interrogation-  reviewed in detail today,  See PACEART report.  Device History: Field seismologist PPM implanted 12/11/03 for Sinus Node Dysfunction Generator Change > 09/28/22  Risk Assessment/Calculations:    CHA2DS2-VASc Score = 4  {Confirm score is correct.  If not, click here to update score.  REFRESH note.  :1} This indicates a 4.8% annual risk of stroke. The patient's score is based upon: CHF History: 0 HTN History: 1 Diabetes History: 0 Stroke History: 0 Vascular Disease History: 1 Age Score: 2 Gender Score: 0   {This patient has a significant risk of stroke if diagnosed with atrial fibrillation.  Please consider VKA or DOAC agent for anticoagulation if the bleeding risk is acceptable.   You can also use the SmartPhrase .[email or web address] for documentation.   :[id number]} No BP recorded.  {Refresh Note OR Click here to enter BP  :1}***        Physical Exam:   VS:  There were no vitals taken for this visit.   Wt Readings from Last 3 Encounters:  12/30/23 142 lb 9 oz (64.7 kg)  01/05/23 138 lb 0.6 oz (62.6 kg)  09/28/22 135 lb (61.2 kg)     GEN: Well  nourished, well developed in no acute distress NECK: No JVD; No carotid bruits CARDIAC: {EPRHYTHM:28826}, no murmurs, rubs, gallops RESPIRATORY:  Clear to auscultation without rales, wheezing or rhonchi  ABDOMEN: Soft, non-tender, non-distended EXTREMITIES:  No edema; No deformity   ASSESSMENT AND PLAN:    SND s/p [employer] PPM  -Normal PPM function -See Pace Art report -No changes today  Atrial Fibrillation / Atrial Flutter  CHA2DS2-VASc 4 -OAC for stroke prophylaxis  -digoxin  0.125 mg daily   Secondary Hypercoagulable State  -continue warfarin, follows in the Coumadin  Clinic  -last INR 12/07/23 > 2.1   Hypertension  -well controlled on current regimen ***  Disposition:   Follow up with Dr. Birmingham {EPFOLLOW LE:71826}  Signed, Daphne Barrack, NP-C, AGACNP-BC [facility] - Electrophysiology  02/02/2024, 9:07 PM

## 2024-02-03 ENCOUNTER — Encounter: Payer: Self-pay | Admitting: Pulmonary Disease

## 2024-02-03 ENCOUNTER — Ambulatory Visit: Attending: Pulmonary Disease | Admitting: Pulmonary Disease

## 2024-02-03 VITALS — BP 118/72 | HR 63 | Ht 64.0 in | Wt 139.8 lb

## 2024-02-03 DIAGNOSIS — I350 Nonrheumatic aortic (valve) stenosis: Secondary | ICD-10-CM

## 2024-02-03 DIAGNOSIS — D6869 Other thrombophilia: Secondary | ICD-10-CM

## 2024-02-03 DIAGNOSIS — I4819 Other persistent atrial fibrillation: Secondary | ICD-10-CM

## 2024-02-03 DIAGNOSIS — Z950 Presence of cardiac pacemaker: Secondary | ICD-10-CM

## 2024-02-03 DIAGNOSIS — I495 Sick sinus syndrome: Secondary | ICD-10-CM | POA: Diagnosis not present

## 2024-02-03 DIAGNOSIS — I340 Nonrheumatic mitral (valve) insufficiency: Secondary | ICD-10-CM

## 2024-02-03 DIAGNOSIS — I77810 Thoracic aortic ectasia: Secondary | ICD-10-CM

## 2024-02-03 LAB — CUP PACEART INCLINIC DEVICE CHECK
Date Time Interrogation Session: 20250710180812
Implantable Lead Connection Status: 753985
Implantable Lead Connection Status: 753985
Implantable Lead Implant Date: 20050517
Implantable Lead Implant Date: 20050517
Implantable Lead Location: 753859
Implantable Lead Location: 753860
Implantable Lead Model: 4086
Implantable Lead Model: 4087
Implantable Lead Serial Number: 218470
Implantable Lead Serial Number: 237198
Implantable Pulse Generator Implant Date: 20240304
Pulse Gen Serial Number: 637012

## 2024-02-03 NOTE — Patient Instructions (Signed)
 Medication Instructions:  Your physician recommends that you continue on your current medications as directed. Please refer to the Current Medication list given to you today.  *If you need a refill on your cardiac medications before your next appointment, please call your pharmacy*  Lab Work: None ordered If you have labs (blood work) drawn today and your tests are completely normal, you will receive your results only by: MyChart Message (if you have MyChart) OR A paper copy in the mail If you have any lab test that is abnormal or we need to change your treatment, we will call you to review the results.  Follow-Up: At [facility], you and your health needs are our priority.  As part of our continuing mission to provide you with exceptional heart care, our providers are all part of one team.  This team includes your primary Cardiologist (physician) and Advanced Practice Providers or APPs (Physician Assistants and Nurse Practitioners) who all work together to provide you with the care you need, when you need it.  Your next appointment:   1 year(s)  Provider:   You will see one of the following Advanced Practice Providers on your designated Care Team:   Charlies Arthur, PA-C Michael Andy Tillery, PA-C Suzann Riddle, NP Daphne Barrack, NP  Other Instructions You have been referred to see one of our general cardiology physicians.

## 2024-02-08 ENCOUNTER — Ambulatory Visit: Attending: Internal Medicine

## 2024-02-08 DIAGNOSIS — I4891 Unspecified atrial fibrillation: Secondary | ICD-10-CM

## 2024-02-08 DIAGNOSIS — Z5181 Encounter for therapeutic drug level monitoring: Secondary | ICD-10-CM | POA: Diagnosis not present

## 2024-02-08 LAB — POCT INR: INR: 3.1 — AB (ref 2.0–3.0)

## 2024-02-08 NOTE — Patient Instructions (Signed)
 Continue taking warfarin 2 tablets daily except for 1 tablet on Sundays, Wednesdays and Saturdays.  Eat greens tonight.  Hernia Repair TBD Stay consistent with greens each week (1-2 per week)  Recheck INR in 4 weeks.  Coumadin  Clinic [id number]. Main # [phone]

## 2024-02-08 NOTE — Progress Notes (Signed)
Please see anticoagulation encounter.

## 2024-02-10 ENCOUNTER — Other Ambulatory Visit

## 2024-02-10 ENCOUNTER — Ambulatory Visit: Payer: Self-pay | Admitting: Gastroenterology

## 2024-02-10 ENCOUNTER — Ambulatory Visit: Admitting: Gastroenterology

## 2024-02-10 ENCOUNTER — Encounter: Payer: Self-pay | Admitting: Gastroenterology

## 2024-02-10 VITALS — BP 104/72 | HR 71 | Ht 64.0 in | Wt 138.0 lb

## 2024-02-10 DIAGNOSIS — D508 Other iron deficiency anemias: Secondary | ICD-10-CM

## 2024-02-10 LAB — IBC + FERRITIN
Ferritin: 45.1 ng/mL (ref 22.0–322.0)
Iron: 31 ug/dL — ABNORMAL LOW (ref 42–165)
Saturation Ratios: 5.9 % — ABNORMAL LOW (ref 20.0–50.0)
TIBC: 525 ug/dL — ABNORMAL HIGH (ref 250.0–450.0)
Transferrin: 375 mg/dL — ABNORMAL HIGH (ref 212.0–360.0)

## 2024-02-10 LAB — CBC WITH DIFFERENTIAL/PLATELET
Basophils Absolute: 0.1 K/uL (ref 0.0–0.1)
Basophils Relative: 1.5 % (ref 0.0–3.0)
Eosinophils Absolute: 0.3 K/uL (ref 0.0–0.7)
Eosinophils Relative: 5.2 % — ABNORMAL HIGH (ref 0.0–5.0)
HCT: 34.3 % — ABNORMAL LOW (ref 39.0–52.0)
Hemoglobin: 10.9 g/dL — ABNORMAL LOW (ref 13.0–17.0)
Lymphocytes Relative: 14.7 % (ref 12.0–46.0)
Lymphs Abs: 0.8 K/uL (ref 0.7–4.0)
MCHC: 32 g/dL (ref 30.0–36.0)
MCV: 87.9 fl (ref 78.0–100.0)
Monocytes Absolute: 0.8 K/uL (ref 0.1–1.0)
Monocytes Relative: 13.3 % — ABNORMAL HIGH (ref 3.0–12.0)
Neutro Abs: 3.8 K/uL (ref 1.4–7.7)
Neutrophils Relative %: 65.3 % (ref 43.0–77.0)
Platelets: 194 K/uL (ref 150.0–400.0)
RBC: 3.9 Mil/uL — ABNORMAL LOW (ref 4.22–5.81)
RDW: 20.3 % — ABNORMAL HIGH (ref 11.5–15.5)
WBC: 5.8 K/uL (ref 4.0–10.5)

## 2024-02-10 NOTE — Patient Instructions (Signed)
 Your provider has requested that you go to the basement level for lab work before leaving today. Press B on the elevator. The lab is located at the first door on the left as you exit the elevator.   _______________________________________________________  If your blood pressure at your visit was 140/90 or greater, please contact your primary care physician to follow up on this.  _______________________________________________________  If you are age 78 or older, your body mass index should be between 23-30. Your Body mass index is 23.69 kg/m. If this is out of the aforementioned range listed, please consider follow up with your Primary Care Provider.  If you are age 64 or younger, your body mass index should be between 19-25. Your Body mass index is 23.69 kg/m. If this is out of the aformentioned range listed, please consider follow up with your Primary Care Provider.   ________________________________________________________  The [facility] providers would like to encourage you to use MYCHART to communicate with providers for non-urgent requests or questions.  Due to long hold times on the telephone, sending your provider a message by [facility] may be a faster and more efficient way to get a response.  Please allow 48 business hours for a response.  Please remember that this is for non-urgent requests.  _______________________________________________________

## 2024-02-10 NOTE — Progress Notes (Addendum)
 [facility] Progress Note  Chief Complaint: Iron deficiency anemia  Summary of GI history:  APP office consult note 12/30/2023 after referral from [facility]. Anemia (probably multifactorial-see note), reported intermittent dark stools, heme neg on DRE here. Last colonoscopy March 2018 Dr. Aneita February 2024 echocardiogram showing LVEF 55 to 60%, however pulmonary systolic pressure 50, dilated right atrium, moderate to severe mitral regurg, severe tricuspid regurg, aortic stenosis, likely area just under a square centimeter  Pacemaker, CAD and A-fib/flutter on warfarin  Cardiology office visit 02/03/2024 notes these issues and plans to refer patient to structural heart clinic for evaluation of aortic stenosis. Subjective  HPI: Phillip Kline return to see me today for reconsideration of his iron deficiency anemia. He still has fatigue and says sometimes during the day he just has to lay down for a nap.  He has not seen black or bloody stool, though his bowel movements have turned dark since starting oral iron recently.  He has been taking 1 tablet 3 days a week, because he thought that is what the pill bottle instructions said. He was also told to take MiraLAX daily if needed for constipation.  Bowel habits do seem to have slowed down somewhat on the iron, so he took 1 dose of MiraLAX and did not notice any improvement. He has ongoing concerns and questions about his heart condition. (Over 10 minutes late for today's visit) ROS: Cardiovascular:  no chest pain Respiratory: Intermittent dyspnea with exertion Fatigue Arthralgias The patient's Past Medical, Family and Social History were reviewed and are on file in the EMR. Past Medical History:  Diagnosis Date   A-fib (HCC)    Abdominal hernia    Anxiety    Arthritis    Atrial fibrillation (HCC)    Atrial flutter (HCC)    Blood transfusion without reported diagnosis    Cataract    bilateral cataracts removed   Clotting disorder (HCC)     coumadin  - a fib   Coronary artery disease    Depression    Hyperlipidemia    Hypertension    Pacemaker    [employer]   Sinus bradycardia    Sleep apnea    wears a c-pap    Past Surgical History:  Procedure Laterality Date   CATARACT EXTRACTION     COLONOSCOPY     hernia repair     PACEMAKER INSERTION     PERMANENT PACEMAKER GENERATOR CHANGE N/A 08/07/2013   Procedure: PERMANENT PACEMAKER GENERATOR CHANGE;  Surgeon: Danelle LELON Birmingham, MD;  Location: [facility] CATH LAB;  Service: Cardiovascular;  Laterality: N/A;   PERMANENT PACEMAKER INSERTION  2005; 08/07/2013   BSX dual chamber pacemaker implanted 2005 for symptomatic bradycardia; gen change 2015 by Dr Birmingham   [facility] GENERATOR CHANGEOUT N/A 09/28/2022   Procedure: PPM GENERATOR CHANGEOUT;  Surgeon: Birmingham Danelle LELON, MD;  Location: [facility] INVASIVE CV LAB;  Service: Cardiovascular;  Laterality: N/A;   REPLACEMENT TOTAL KNEE     right knee   thumb surgery     left and right   TONSILLECTOMY      Objective:  Med list reviewed  Current Outpatient Medications:    acetaminophen  (TYLENOL ) 325 MG tablet, Take 2 tablets (650 mg total) by mouth every 6 (six) hours as needed for fever., Disp: , Rfl:    atorvastatin  (LIPITOR) 80 MG tablet, Take 40 mg by mouth at bedtime. , Disp: , Rfl:    Calcium  Carbonate-Vitamin D  500-125 MG-UNIT TABS, Take 1 tablet by mouth 2 (  two) times daily., Disp: , Rfl:    digoxin  (LANOXIN ) 0.125 MG tablet, TAKE 1 TABLET BY MOUTH DAILY. PLEASE KEEP NOVEMBER APPT TO GET MORE REFILLS., Disp: 90 tablet, Rfl: 3   ferrous sulfate 325 (65 FE) MG tablet, Take 325 mg by mouth daily with breakfast., Disp: , Rfl:    hydrochlorothiazide  (MICROZIDE ) 12.5 MG capsule, Take 12.5 mg by mouth daily., Disp: , Rfl:    lisinopril  (ZESTRIL ) 2.5 MG tablet, Take 2.5 mg by mouth daily., Disp: , Rfl:    QUEtiapine  (SEROQUEL ) 25 MG tablet, Take 12.5 mg by mouth at bedtime. , Disp: , Rfl:    sertraline  (ZOLOFT ) 100 MG tablet, Take 200 mg by mouth  daily. , Disp: , Rfl:    sildenafil (VIAGRA) 50 MG tablet, Take by mouth as needed., Disp: , Rfl:    thiamine  100 MG tablet, Take 100 mg by mouth daily., Disp: , Rfl:    verapamil  (CALAN -SR) 180 MG CR tablet, Take 1 tablet by mouth daily., Disp: , Rfl:    vitamin B-12 (CYANOCOBALAMIN ) 500 MCG tablet, Take 500 mcg by mouth 2 (two) times daily., Disp: , Rfl:    warfarin (COUMADIN ) 2 MG tablet, Take 1-2 tablets (2-4 mg total) by mouth daily as directed by the coumadin  clinic., Disp: 60 tablet, Rfl: 2   Vital signs in last 24 hrs: Vitals:   02/10/24 1035  BP: 104/72  Pulse: 71   Wt Readings from Last 3 Encounters:  02/10/24 138 lb (62.6 kg)  02/03/24 139 lb 12.8 oz (63.4 kg)  12/30/23 142 lb 9 oz (64.7 kg)    Physical Exam  Pleasant and conversational HEENT: sclera anicteric, oral mucosa moist without lesions Neck: supple, no thyromegaly, JVD or lymphadenopathy Cardiac: Regular with a harsh systolic murmur,  no peripheral edema Pulm: clear to auscultation bilaterally, normal RR and effort noted Abdomen: soft, no tenderness, with active bowel sounds. No guarding or palpable hepatosplenomegaly.  No bruit Skin; warm and dry, no jaundice or rash  Labs:     Latest Ref Rng & Units 12/30/2023   10:41 AM 09/23/2022    2:52 PM 06/06/2021   10:12 AM  CBC  WBC 4.0 - 10.5 K/uL 6.1  6.8  5.8   Hemoglobin 13.0 - 17.0 g/dL 88.7  86.7  87.6   Hematocrit 39.0 - 52.0 % 34.8  40.6  38.6   Platelets 150.0 - 400.0 K/uL 188.0  198  180       Latest Ref Rng & Units 12/30/2023   10:41 AM 09/23/2022    2:52 PM 06/06/2021   10:12 AM  CMP  Glucose 70 - 99 mg/dL 94  79  90   BUN 6 - 23 mg/dL 26  25  21    Creatinine 0.40 - 1.50 mg/dL 8.97  8.64  8.78   Sodium 135 - 145 mEq/L 140  141  138   Potassium 3.5 - 5.1 mEq/L 4.2  4.7  4.5   Chloride 96 - 112 mEq/L 102  99  100   CO2 19 - 32 mEq/L 27  27  27    Calcium  8.4 - 10.5 mg/dL 9.5  9.4  9.5   Total Protein 6.0 - 8.3 g/dL 7.1     Total Bilirubin 0.2 -  1.2 mg/dL 1.0     Alkaline Phos 39 - 117 U/L 71     AST 0 - 37 U/L 18     ALT 0 - 53 U/L 13      Iron/TIBC/Ferritin/ %  Sat    Component Value Date/Time   IRON 37 (L) 12/30/2023 1041   TIBC 547.4 (H) 12/30/2023 1041   FERRITIN 53.3 12/30/2023 1041   IRONPCTSAT 6.8 (L) 12/30/2023 1041   B12 and folic acid  normal ___________________________________________ Radiologic studies:   ____________________________________________ Other:   _____________________________________________ Assessment & Plan  Assessment: Encounter Diagnosis  Name Primary?   Other iron deficiency anemia Yes   (Challenges of health literacy noted today)  This appears to be anemia of chronic disease with iron deficiency.  However, there is not currently clear evidence that he has chronic blood loss to account for this.  There is the possibility there could be a component of slow intermittent GI blood loss that perhaps may not be picked up on one office-based DRE. So there may yet be a role for EGD and colonoscopy to investigate this.  However, that unquestionably must wait until he has further testing and treatment for his cardiac valvular issues, most notably the aortic stenosis.  He has been referred to structural cardiac clinic and has a visit with them next month.  So any consideration of endoscopic procedures is currently on hold pending that workup.  He will continue to take iron, but I recommended he do it every day and we will get a repeat CBC and iron studies today.  Since he has some iron related constipation, I recommended he take the MiraLAX 2-3 times a day for the next 2 days until bowel movements get going better, then decrease to once daily.  We will await further communications from the cardiology clinic and proceed accordingly.   32 minutes were spent on this encounter (including chart review, history/exam, counseling/coordination of care, and documentation) > 50% of that time was spent on  counseling and coordination of care.   Phillip Kline

## 2024-02-11 ENCOUNTER — Telehealth: Payer: Self-pay

## 2024-02-11 NOTE — Telephone Encounter (Signed)
 I have left two messages on the patient's voicemail on 02/10/24 and 02/11/24 to schedule a TAVR consult and discuss his echo from 12/23/23. Awaiting patient to return call.

## 2024-02-16 ENCOUNTER — Other Ambulatory Visit: Payer: Self-pay

## 2024-02-16 ENCOUNTER — Telehealth: Payer: Self-pay

## 2024-02-16 DIAGNOSIS — I350 Nonrheumatic aortic (valve) stenosis: Secondary | ICD-10-CM

## 2024-02-16 NOTE — Telephone Encounter (Signed)
 I spoke with the patient's sister to let her know we have been trying to get in touch with Phillip Kline. She states that she is his medical power of attorney and that he does not often answer his phone. I discussed with her that, since we are unable to view his echo images from the [facility], we will either need to order another echo or the patient will need to obtain a CD copy from the [facility]. She said that she would reach out to the patient and ask him to call us  back to discuss the echo and schedule an appointment with a structural MD. Currently awaiting the patient's call.

## 2024-02-16 NOTE — Telephone Encounter (Signed)
 Patient returned call and decided to get a new echocardiogram rather than get a CD copy from the [facility]. Patient scheduled for echo on 8/28 at 8:20am. Patient scheduled for consult with Dr. Wonda on 03/29/24 at 11:20am. I called the patient's sister Catarino afterwards to review dates. She will try to come with the patient to consult with Dr. Wonda.  Patient also aware of appointment with Jodie Passey PA-C on 03/13/24.

## 2024-02-22 NOTE — Addendum Note (Signed)
 Addended by: VICCI SELLER A on: 02/22/2024 10:23 AM   Modules accepted: Orders

## 2024-02-22 NOTE — Progress Notes (Signed)
 Remote pacemaker transmission.

## 2024-03-07 ENCOUNTER — Ambulatory Visit: Attending: Internal Medicine

## 2024-03-07 DIAGNOSIS — Z5181 Encounter for therapeutic drug level monitoring: Secondary | ICD-10-CM

## 2024-03-07 DIAGNOSIS — I4891 Unspecified atrial fibrillation: Secondary | ICD-10-CM

## 2024-03-07 LAB — POCT INR: INR: 2.1 (ref 2.0–3.0)

## 2024-03-07 NOTE — Progress Notes (Signed)
 INR 2.1; Please see anticoagulation encounter

## 2024-03-07 NOTE — Patient Instructions (Signed)
 Continue taking warfarin 2 tablets daily except for 1 tablet on Sundays, Wednesdays and Saturdays.    Hernia Repair TBD Stay consistent with greens each week (1-2 per week)  Recheck INR in 6 weeks.  Coumadin  Clinic [phone]. Main # [phone]

## 2024-03-13 ENCOUNTER — Encounter: Payer: Self-pay | Admitting: Student

## 2024-03-13 ENCOUNTER — Ambulatory Visit: Attending: Student | Admitting: Student

## 2024-03-13 VITALS — BP 133/87 | HR 70 | Ht 64.0 in | Wt 134.0 lb

## 2024-03-13 DIAGNOSIS — Z950 Presence of cardiac pacemaker: Secondary | ICD-10-CM

## 2024-03-13 NOTE — Progress Notes (Signed)
 Pt made visit by mistake thinking he needed a new EKG and had been un-reachable by phone to clarify reason for visit.   See note from 7/10 for plan regarding valve.  He denies any new or acute concerns for today.   Has echo planned fro 03/23/2024 with structural consult 9/3.  Pt states his dog ate his monitor for his pacemaker.  Will alert [facility] to send a new one.   No charge for today's visit.   Ozell Jodie Passey, PA-C  03/13/2024 11:51 AM

## 2024-03-13 NOTE — Patient Instructions (Signed)
 Medication Instructions:  Your physician recommends that you continue on your current medications as directed. Please refer to the Current Medication list given to you today.  *If you need a refill on your cardiac medications before your next appointment, please call your pharmacy*  Lab Work: None ordered If you have labs (blood work) drawn today and your tests are completely normal, you will receive your results only by: MyChart Message (if you have MyChart) OR A paper copy in the mail If you have any lab test that is abnormal or we need to change your treatment, we will call you to review the results.  Follow-Up: At [facility], you and your health needs are our priority.  As part of our continuing mission to provide you with exceptional heart care, our providers are all part of one team.  This team includes your primary Cardiologist (physician) and Advanced Practice Providers or APPs (Physician Assistants and Nurse Practitioners) who all work together to provide you with the care you need, when you need it.  Your next appointment:   July 2026  Provider:   You will see one of the following Advanced Practice Providers on your designated Care Team:   Charlies Arthur, PA-C Michael Andy Tillery, PA-C Suzann Riddle, NP Daphne Barrack, NP

## 2024-03-20 ENCOUNTER — Telehealth: Payer: Self-pay | Admitting: Internal Medicine

## 2024-03-20 NOTE — Telephone Encounter (Signed)
*  STAT* If patient is at the pharmacy, call can be transferred to refill team.   1. Which medications need to be refilled? (please list name of each medication and dose if known)  warfarin (COUMADIN ) 2 MG tablet   2. Which pharmacy/location (including street and city if local pharmacy) is medication to be sent to? [id number] - [place], [place] - [address] AT [facility] OF [address]  3. Do they need a 30 day or 90 day supply?  90 day supply

## 2024-03-23 ENCOUNTER — Ambulatory Visit (HOSPITAL_COMMUNITY)
Admission: RE | Admit: 2024-03-23 | Discharge: 2024-03-23 | Disposition: A | Discharge status: Home / Self Care | Source: Ambulatory Visit | Attending: Cardiology | Admitting: Cardiology

## 2024-03-23 DIAGNOSIS — I10 Essential (primary) hypertension: Secondary | ICD-10-CM | POA: Insufficient documentation

## 2024-03-23 DIAGNOSIS — I499 Cardiac arrhythmia, unspecified: Secondary | ICD-10-CM | POA: Insufficient documentation

## 2024-03-23 DIAGNOSIS — I495 Sick sinus syndrome: Secondary | ICD-10-CM | POA: Diagnosis not present

## 2024-03-23 DIAGNOSIS — I2510 Atherosclerotic heart disease of native coronary artery without angina pectoris: Secondary | ICD-10-CM | POA: Insufficient documentation

## 2024-03-23 DIAGNOSIS — I4891 Unspecified atrial fibrillation: Secondary | ICD-10-CM | POA: Diagnosis not present

## 2024-03-23 DIAGNOSIS — I350 Nonrheumatic aortic (valve) stenosis: Secondary | ICD-10-CM | POA: Diagnosis present

## 2024-03-23 DIAGNOSIS — I083 Combined rheumatic disorders of mitral, aortic and tricuspid valves: Secondary | ICD-10-CM | POA: Diagnosis not present

## 2024-03-23 DIAGNOSIS — Z950 Presence of cardiac pacemaker: Secondary | ICD-10-CM | POA: Insufficient documentation

## 2024-03-23 DIAGNOSIS — I77810 Thoracic aortic ectasia: Secondary | ICD-10-CM | POA: Diagnosis not present

## 2024-03-23 DIAGNOSIS — E785 Hyperlipidemia, unspecified: Secondary | ICD-10-CM | POA: Insufficient documentation

## 2024-03-23 LAB — ECHOCARDIOGRAM COMPLETE
AR max vel: 1.01 cm2
AV Area VTI: 0.96 cm2
AV Area mean vel: 1 cm2
AV Mean grad: 12.5 mmHg
AV Peak grad: 24 mmHg
Ao pk vel: 2.45 m/s
S' Lateral: 4.28 cm

## 2024-03-27 ENCOUNTER — Ambulatory Visit: Payer: Self-pay | Admitting: Cardiovascular Disease

## 2024-03-29 ENCOUNTER — Ambulatory Visit: Attending: Cardiovascular Disease | Admitting: Cardiovascular Disease

## 2024-03-29 ENCOUNTER — Encounter: Payer: Self-pay | Admitting: Cardiovascular Disease

## 2024-03-29 VITALS — BP 120/84 | HR 70 | Ht 64.0 in | Wt 141.6 lb

## 2024-03-29 DIAGNOSIS — I480 Paroxysmal atrial fibrillation: Secondary | ICD-10-CM

## 2024-03-29 DIAGNOSIS — Z950 Presence of cardiac pacemaker: Secondary | ICD-10-CM | POA: Diagnosis not present

## 2024-03-29 DIAGNOSIS — Z01812 Encounter for preprocedural laboratory examination: Secondary | ICD-10-CM

## 2024-03-29 DIAGNOSIS — I071 Rheumatic tricuspid insufficiency: Secondary | ICD-10-CM

## 2024-03-29 DIAGNOSIS — I350 Nonrheumatic aortic (valve) stenosis: Secondary | ICD-10-CM | POA: Diagnosis not present

## 2024-03-29 DIAGNOSIS — I2510 Atherosclerotic heart disease of native coronary artery without angina pectoris: Secondary | ICD-10-CM

## 2024-03-29 DIAGNOSIS — I340 Nonrheumatic mitral (valve) insufficiency: Secondary | ICD-10-CM

## 2024-03-29 NOTE — Progress Notes (Signed)
 Pre Surgical Assessment: 5 M Walk Test  56M=16.34ft  5 Meter Walk Test- trial 1: 5.64 seconds 5 Meter Walk Test- trial 2: 6.27 seconds 5 Meter Walk Test- trial 3: 6.83 seconds 5 Meter Walk Test Average: 6.25 seconds

## 2024-03-29 NOTE — H&P (View-Only) (Signed)
 [facility]   MULTIDISCIPLINARY HEART VALVE TEAM  Date:  03/29/2024   ID:  Phillip Kline, DOB Oct 03, 1945, MRN [id number]  PCP:  [facility]   No chief complaint on file.    HISTORY OF PRESENT ILLNESS: Phillip Kline is a 78 y.o. male who presents for evaluation of multivalve disease, referred by Dr Waddell.  He has a history of paroxysmal atrial fibrillation, permanent pacemaker, and valvular heart disease.  Recent echo is completed and demonstrates mild LV dysfunction with LVEF 45 to 50%, mild concentric LVH, grade 3 diastolic dysfunction, moderately reduced RV function, massive biatrial dilatation, moderate to severe mitral regurgitation, moderate low-flow low gradient aortic stenosis, and severe tricuspid regurgitation.  He is referred for further management and evaluation of his valvular heart disease.  The patient is here with his sister today.  He lives alone in [place].  He reports that he has shortness of breath with activities such as walking up a hill or getting in a hurry.  He can do his ADLs with no problem.  He walks his dog without limitation.  He denies orthopnea, PND, or leg swelling.  He said no chest pain or pressure.  He complains of increased fatigue.  He has no history of rheumatic fever.  The patient has had lumbar back surgery in the replacement in the past.  The patient has had regular dental care and reports that he has had dental implants and denies any current problems.  Past Medical History:  Diagnosis Date   A-fib (HCC)    Abdominal hernia    Anxiety    Arthritis    Atrial fibrillation (HCC)    Atrial flutter (HCC)    Blood transfusion without reported diagnosis    Cataract    bilateral cataracts removed   Clotting disorder (HCC)    coumadin  - a fib   Coronary artery disease    Depression    Hyperlipidemia    Hypertension    Pacemaker    [employer]   Sinus bradycardia    Sleep apnea    wears a c-pap    Past Surgical History:  Procedure Laterality Date   CATARACT EXTRACTION     COLONOSCOPY     hernia repair     PACEMAKER INSERTION     PERMANENT PACEMAKER GENERATOR CHANGE N/A 08/07/2013   Procedure: PERMANENT PACEMAKER GENERATOR CHANGE;  Surgeon: Danelle LELON Waddell, MD;  Location: [facility] C[facility] LAB;  Service: Cardiovascular;  Laterality: N/A;   PERMANENT PACEMAKER INSERTION  2005; 08/07/2013   BSX dual chamber pacemaker implanted 2005 for symptomatic bradycardia; gen change 2015 by Dr Waddell   [facility] GENERATOR CHANGEOUT N/A 09/28/2022   Procedure: PPM GENERATOR CHANGEOUT;  Surgeon: Waddell Danelle LELON, MD;  Location: [facility] INVASIVE CV LAB;  Service: Cardiovascular;  Laterality: N/A;   REPLACEMENT TOTAL KNEE     right knee   thumb surgery     left and right   TONSILLECTOMY      Current Outpatient Medications  Medication Sig Dispense Refill   digoxin  (LANOXIN ) 0.125 MG tablet TAKE 1 TABLET BY MOUTH DAILY. PLEASE KEEP NOVEMBER APPT TO GET MORE REFILLS. 90 tablet 3   ferrous sulfate 325 (65 FE) MG tablet Take 325 mg by mouth daily with breakfast.     hydrochlorothiazide  (MICROZIDE ) 12.5 MG capsule Take 12.5 mg by mouth daily.     lisinopril  (ZESTRIL ) 2.5 MG tablet Take 2.5 mg by mouth daily.     QUEtiapine  (SEROQUEL ) 25  MG tablet Take 12.5 mg by mouth at bedtime.      sertraline  (ZOLOFT ) 100 MG tablet Take 200 mg by mouth daily.      sildenafil (VIAGRA) 50 MG tablet Take by mouth as needed.     thiamine  100 MG tablet Take 100 mg by mouth daily.     verapamil  (CALAN -SR) 180 MG CR tablet Take 1 tablet by mouth daily.     vitamin B-12 (CYANOCOBALAMIN ) 500 MCG tablet Take 500 mcg by mouth 2 (two) times daily.     warfarin (COUMADIN ) 2 MG tablet Take 1-2 tablets (2-4 mg total) by mouth daily as directed by the coumadin  clinic. 60 tablet 2   acetaminophen  (TYLENOL ) 325 MG tablet Take 2 tablets (650 mg total) by mouth every 6 (six) hours as needed for fever.     atorvastatin  (LIPITOR) 80 MG tablet Take 40 mg by  mouth at bedtime.      Calcium  Carbonate-Vitamin D  500-125 MG-UNIT TABS Take 1 tablet by mouth 2 (two) times daily.     No current facility-administered medications for this visit.    ALLERGIES:   Patient has no known allergies.   SOCIAL HISTORY:  The patient  reports that he has never smoked. He has never used smokeless tobacco. He reports current alcohol use. He reports that he does not currently use drugs after having used the following drugs: Marijuana.   FAMILY HISTORY:  The patient's family history includes Cancer in his father; Stomach cancer in his mother and sister.   REVIEW OF SYSTEMS:  Positive for fatigue, knee pain.   All other systems are reviewed and negative.   PHYSICAL EXAM: VS:  BP 120/84   Pulse 70   Ht 5' 4 (1.626 m)   Wt 141 lb 9.6 oz (64.2 kg)   SpO2 97%   BMI 24.31 kg/m  , BMI Body mass index is 24.31 kg/m. GEN: Well nourished, well developed, in no acute distress HEENT: normal Neck: No JVD. carotids 2+ without bruits or masses Cardiac: The heart is RRR with a 3/6 harsh systolic murmur heard loudest at the apex but present throughout the precordium Respiratory:  clear to auscultation bilaterally GI: soft, nontender, nondistended, + BS MS: no deformity or atrophy Skin: warm and dry, no rash Neuro:  Strength and sensation are intact Psych: euthymic mood, full affect  EKG:  EKG from today reviewed and demonstrates AV sequential pacing  RECENT LABS: 12/30/2023: ALT 13; BUN 26; Creatinine, Ser 1.02; Potassium 4.2; Sodium 140 02/10/2024: Hemoglobin 10.9; Platelets 194.0  No results found for requested labs within last 365 days.   CrCl cannot be calculated (Patient's most recent lab result is older than the maximum 21 days allowed.).   Wt Readings from Last 3 Encounters:  03/29/24 141 lb 9.6 oz (64.2 kg)  03/13/24 134 lb (60.8 kg)  02/10/24 138 lb (62.6 kg)     CARDIAC STUDIES: Cardiac Studies & Procedures    ______________________________________________________________________________________________   STRESS TESTS  EXERCISE TOLERANCE TEST (ETT) 12/01/2016  Interpretation Summary  The patient walked for 3:29 of a Bruce protocol GXT. Peak HR of 117 which is 78% predicted maximal HR.  The patient is in atrial fib and is V paced. ST segments were not evaluated.  There was a controlled / gradual increase in HR with exercise   ECHOCARDIOGRAM  ECHOCARDIOGRAM COMPLETE 03/23/2024  Narrative ECHOCARDIOGRAM REPORT    Patient Name:   Phillip Kline Date of Exam: 03/23/2024 Medical Rec #:  [id number]  Height:       64.0 in Accession #:    [id number]       Weight:       134.0 lb Date of Birth:  [birth year]        BSA:          1.650 m Patient Age:    71 years         BP:           133/87 mmHg Patient Gender: M                HR:           70 bpm. Exam Location:  [address]  Procedure: 2D Echo, 3D Echo and Strain Analysis (Both Spectral and Color Flow Doppler were utilized during procedure).  Indications:    I35.0 Nonrheumatic aortic (valve) stenosis  History:        Patient has prior history of Echocardiogram examinations, most recent 12/23/2023. Pacemaker, Arrythmias:Atrial Fibrillation; Risk Factors:Hypertension and Dyslipidemia. Sinus node dysfunction. Coronary atherosclerosis.  Sonographer:    Jon Hacker RCS Referring Phys: [id number] Ruthie Berch  IMPRESSIONS   1. Left ventricular ejection fraction, by estimation, is 45 to 50%. Left ventricular ejection fraction by 3D volume is 51 %. The left ventricle has mildly decreased function. The left ventricle demonstrates global hypokinesis. There is mild concentric left ventricular hypertrophy. Left ventricular diastolic parameters are consistent with Grade III diastolic dysfunction (restrictive). Elevated left atrial pressure. The average left ventricular global longitudinal strain is -19.8 %. The global longitudinal strain is  normal. 2. Right ventricular systolic function is moderately reduced. The right ventricular size is mildly enlarged. There is moderately elevated pulmonary artery systolic pressure. The estimated right ventricular systolic pressure is 58.8 mmHg. 3. Right atrial size was Massively dilated. 4. Left atrial size was Massively dilated. 5. The mitral valve is myxomatous. Moderate to severe mitral valve regurgitation. No evidence of mitral stenosis. There is mild late systolic prolapse of both leaflets of the mitral valve. MR likely functional from massive LA dilateation. 6. Tricuspid valve regurgitation is severe. 7. The aortic valve is calcified. There is severe calcifcation of the aortic valve. There is severe thickening of the aortic valve. Aortic valve regurgitation is not visualized. Moderate aortic valve stenosis. Aortic valve area, by VTI measures 0.96 cm. Aortic valve mean gradient measures 12.5 mmHg. Aortic valve Vmax measures 2.45 m/s. DI 0.31, SVI 26. Findings c/w paradoxical low flow low gradient moderate AS. 8. Aortic dilatation noted. There is mild dilatation of the ascending aorta, measuring 38 mm. 9. The inferior vena cava is dilated in size with <50% respiratory variability, suggesting right atrial pressure of 15 mmHg. 10. Compared to study dated 2/29/2025, there is no significant change in AS and MR. LVF has declined.  FINDINGS Left Ventricle: Left ventricular ejection fraction, by estimation, is 45 to 50%. Left ventricular ejection fraction by 3D volume is 51 %. The left ventricle has mildly decreased function. The left ventricle demonstrates global hypokinesis. The average left ventricular global longitudinal strain is -19.8 %. Strain was performed and the global longitudinal strain is normal. The left ventricular internal cavity size was normal in size. There is mild concentric left ventricular hypertrophy. Left ventricular diastolic parameters are consistent with Grade III diastolic  dysfunction (restrictive). Elevated left atrial pressure.  Right Ventricle: The right ventricular size is mildly enlarged. No increase in right ventricular wall thickness. Right ventricular systolic function is moderately reduced. There is moderately elevated pulmonary artery systolic pressure.  The tricuspid regurgitant velocity is 3.31 m/s, and with an assumed right atrial pressure of 15 mmHg, the estimated right ventricular systolic pressure is 58.8 mmHg.  Left Atrium: Left atrial size was Massively dilated.  Right Atrium: Right atrial size was Massively dilated.  Pericardium: There is no evidence of pericardial effusion.  Mitral Valve: The mitral valve is myxomatous. There is mild late systolic prolapse of both leaflets of the mitral valve. There is moderate thickening of the mitral valve leaflet(s). Moderate mitral annular calcification. Moderate to severe mitral valve regurgitation. No evidence of mitral valve stenosis.  Tricuspid Valve: The tricuspid valve is normal in structure. Tricuspid valve regurgitation is severe. No evidence of tricuspid stenosis.  Aortic Valve: The aortic valve is calcified. There is severe calcifcation of the aortic valve. There is severe thickening of the aortic valve. Aortic valve regurgitation is not visualized. Moderate aortic stenosis is present. Aortic valve mean gradient measures 12.5 mmHg. Aortic valve peak gradient measures 24.0 mmHg. Aortic valve area, by VTI measures 0.96 cm.  Pulmonic Valve: The pulmonic valve was normal in structure. Pulmonic valve regurgitation is mild to moderate. No evidence of pulmonic stenosis.  Aorta: Aortic dilatation noted. There is mild dilatation of the ascending aorta, measuring 38 mm.  Venous: The inferior vena cava is dilated in size with less than 50% respiratory variability, suggesting right atrial pressure of 15 mmHg.  IAS/Shunts: No atrial level shunt detected by color flow Doppler.  Additional Comments: 3D was  performed not requiring image post processing on an independent workstation and was normal. A device lead is visualized.   LEFT VENTRICLE PLAX 2D LVIDd:         5.81 cm         Diastology LVIDs:         4.28 cm         LV e' medial:    8.16 cm/s LV PW:         1.39 cm         LV E/e' medial:  14.8 LV IVS:        1.12 cm         LV e' lateral:   10.20 cm/s LVOT diam:     2.00 cm         LV E/e' lateral: 11.9 LV SV:         43 LV SV Index:   26              2D Longitudinal LVOT Area:     3.14 cm        Strain 2D Strain GLS   -18.3 % (A4C): 2D Strain GLS   -26.7 % (A3C): 2D Strain GLS   -14.3 % (A2C): 2D Strain GLS   -19.8 % Avg:  3D Volume EF LV 3D EF:    Left ventricul ar ejection fraction by 3D volume is 51 %.  3D Volume EF: 3D EF:        51 % LV EDV:       150 ml LV ESV:       73 ml LV SV:        76 ml  RIGHT VENTRICLE RV Basal diam:  4.01 cm RV S prime:     8.49 cm/s TAPSE (M-mode): 1.0 cm RVSP:           58.8 mmHg  LEFT ATRIUM              Index  RIGHT ATRIUM            Index LA diam:        7.00 cm  4.24 cm/m    RA Pressure: 15.00 mmHg LA Vol (A2C):   208.0 ml 126.05 ml/m  RA Area:     28.50 cm LA Vol (A4C):   206.0 ml 124.83 ml/m  RA Volume:   99.90 ml   60.54 ml/m LA Biplane Vol: 211.0 ml 127.86 ml/m AORTIC VALVE AV Area (Vmax):    1.01 cm AV Area (Vmean):   1.00 cm AV Area (VTI):     0.96 cm AV Vmax:           245.00 cm/s AV Vmean:          162.500 cm/s AV VTI:            0.450 m AV Peak Grad:      24.0 mmHg AV Mean Grad:      12.5 mmHg LVOT Vmax:         78.40 cm/s LVOT Vmean:        51.900 cm/s LVOT VTI:          0.138 m LVOT/AV VTI ratio: 0.31  AORTA Ao Root diam: 3.40 cm Ao Asc diam:  3.80 cm  MV E velocity: 121.00 cm/s  TRICUSPID VALVE MV A velocity: 42.80 cm/s   TR Peak grad:   43.8 mmHg MV E/A ratio:  2.83         TR Vmax:        331.00 cm/s Estimated RAP:  15.00 mmHg RVSP:           58.8 mmHg  SHUNTS Systemic  VTI:  0.14 m Systemic Diam: 2.00 cm  Phillip Bihari MD Electronically signed by Phillip Bihari MD Signature Date/Time: 03/23/2024/11:54:14 PM    Final          ______________________________________________________________________________________________      ASSESSMENT AND PLAN: 1.  Moderate low-flow low gradient aortic stenosis 2.  Severe mitral regurgitation 3.  Severe tricuspid regurgitation 4.  Paroxysmal atrial fibrillation on warfarin 5.  Heart failure with mildly reduced ejection fraction (HFmrEF)  The patient's recent echo was reviewed and shows an LVEF of 45 to 50% with mild global hypokinesis of the left ventricle, mild LVH, and grade 3 restrictive diastolic dysfunction.  RV function is moderately reduced and PA pressure is elevated, estimated at 59 mmHg.  There is massive biatrial enlargement.  The mitral valve exhibits  moderate to severe mitral regurgitation and is described as myxomatous.  The aortic valve is severely calcified with moderate aortic stenosis, mean gradient 13 mmHg, dimensionless index 0.31, low stroke-volume index of 26.  By my review he has both severe mitral regurgitation and severe tricuspid regurgitation.  He reports NYHA functional class II limitation.  I have reviewed the natural history of aortic stenosis with the patient and their family members who are present today. We have discussed the limitations of medical therapy and the poor prognosis associated with symptomatic aortic stenosis. We have reviewed potential treatment options, including palliative medical therapy, conventional surgical aortic valve replacement, and transcatheter aortic valve replacement. We discussed treatment options in the context of the patient's specific comorbid medical conditions.  The patient understands his situation is more complicated by the presence of both severe mitral and tricuspid regurgitation.  I suspect the primary mechanism of his mitral and tricuspid regurgitation  is atrial functional as he has massive biatrial enlargement.  I am not sure  that he truly has prolapse but flap closure of the AV valves because of the severe atrial dilatation.  He has concerning findings of restrictive diastolic filling on his echo study.  Infiltrative disease and amyloidosis should be considered in his differential diagnosis.  Think that he is a candidate for MRI due to his pacemaker.  I have recommended starting with a right left heart catheterization for full hemodynamic assessment of his valvular heart disease.  He should also have a transesophageal echo to better assess the functional anatomy of his mitral and tricuspid valves.  I reviewed risks, indications, and alternatives to both cardiac catheterization and possible PCI as well as transesophageal echo.  The patient understands and agrees to proceed with the studies as next steps in his evaluation.  Once his studies are completed, I will likely refer him for formal heart failure consultation and we will also review his case in our structural heart team meeting.  Informed Consent   Shared Decision Making/Informed Consent   The risks [esophageal damage, perforation (1:10,000 risk), bleeding, pharyngeal hematoma as well as other potential complications associated with conscious sedation including aspiration, arrhythmia, respiratory failure and death], benefits (treatment guidance and diagnostic support) and alternatives of a transesophageal echocardiogram were discussed in detail with Phillip Kline and he is willing to proceed.     Informed Consent   Shared Decision Making/Informed Consent The risks [stroke (1 in 1000), death (1 in 1000), kidney failure [usually temporary] (1 in 500), bleeding (1 in 200), allergic reaction [possibly serious] (1 in 200)], benefits (diagnostic support and management of coronary artery disease) and alternatives of a cardiac catheterization were discussed in detail with Phillip Kline and he is willing to  proceed.      Phillip Kline 03/29/2024 1:23 PM

## 2024-03-29 NOTE — Progress Notes (Signed)
 [facility]   MULTIDISCIPLINARY HEART VALVE TEAM  Date:  03/29/2024   ID:  Phillip Kline, DOB 06/09/1946, MRN [id number]  PCP:  Clinic, Phillip Kline   No chief complaint on file.    HISTORY OF PRESENT ILLNESS: Phillip Kline is a 78 y.o. male who presents for evaluation of multivalve disease, referred by Dr Phillip Kline.  He has a history of paroxysmal atrial fibrillation, permanent pacemaker, and valvular heart disease.  Recent echo is completed and demonstrates mild LV dysfunction with LVEF 45 to 50%, mild concentric LVH, grade 3 diastolic dysfunction, moderately reduced RV function, massive biatrial dilatation, moderate to severe mitral regurgitation, moderate low-flow low gradient aortic stenosis, and severe tricuspid regurgitation.  He is referred for further management and evaluation of his valvular heart disease.  The patient is here with his sister today.  He lives alone in [place].  He reports that he has shortness of breath with activities such as walking up a hill or getting in a hurry.  He can do his ADLs with no problem.  He walks his dog without limitation.  He denies orthopnea, PND, or leg swelling.  He said no chest pain or pressure.  He complains of increased fatigue.  He has no history of rheumatic fever.  The patient has had lumbar back surgery in the replacement in the past.  The patient has had regular dental care and reports that he has had dental implants and denies any current problems.  Past Medical History:  Diagnosis Date   A-fib (HCC)    Abdominal hernia    Anxiety    Arthritis    Atrial fibrillation (HCC)    Atrial flutter (HCC)    Blood transfusion without reported diagnosis    Cataract    bilateral cataracts removed   Clotting disorder (HCC)    coumadin  - a fib   Coronary artery disease    Depression    Hyperlipidemia    Hypertension    Pacemaker    [employer]   Sinus bradycardia    Sleep apnea    wears a c-pap    Past Surgical History:  Procedure Laterality Date   CATARACT EXTRACTION     COLONOSCOPY     hernia repair     PACEMAKER INSERTION     PERMANENT PACEMAKER GENERATOR CHANGE N/A 08/07/2013   Procedure: PERMANENT PACEMAKER GENERATOR CHANGE;  Surgeon: Phillip Kline Waddell, MD;  Location: [facility] CATH LAB;  Service: Cardiovascular;  Laterality: N/A;   PERMANENT PACEMAKER INSERTION  2005; 08/07/2013   BSX dual chamber pacemaker implanted 2005 for symptomatic bradycardia; gen change 2015 by Dr Phillip Kline   [facility] GENERATOR CHANGEOUT N/A 09/28/2022   Procedure: PPM GENERATOR CHANGEOUT;  Surgeon: Phillip Kline Phillip LELON, MD;  Location: [facility] INVASIVE CV LAB;  Service: Cardiovascular;  Laterality: N/A;   REPLACEMENT TOTAL KNEE     right knee   thumb surgery     left and right   TONSILLECTOMY      Current Outpatient Medications  Medication Sig Dispense Refill   digoxin  (LANOXIN ) 0.125 MG tablet TAKE 1 TABLET BY MOUTH DAILY. PLEASE KEEP NOVEMBER APPT TO GET MORE REFILLS. 90 tablet 3   ferrous sulfate 325 (65 FE) MG tablet Take 325 mg by mouth daily with breakfast.     hydrochlorothiazide  (MICROZIDE ) 12.5 MG capsule Take 12.5 mg by mouth daily.     lisinopril  (ZESTRIL ) 2.5 MG tablet Take 2.5 mg by mouth daily.     QUEtiapine  (SEROQUEL ) 25  MG tablet Take 12.5 mg by mouth at bedtime.      sertraline  (ZOLOFT ) 100 MG tablet Take 200 mg by mouth daily.      sildenafil (VIAGRA) 50 MG tablet Take by mouth as needed.     thiamine  100 MG tablet Take 100 mg by mouth daily.     verapamil  (CALAN -SR) 180 MG CR tablet Take 1 tablet by mouth daily.     vitamin B-12 (CYANOCOBALAMIN ) 500 MCG tablet Take 500 mcg by mouth 2 (two) times daily.     warfarin (COUMADIN ) 2 MG tablet Take 1-2 tablets (2-4 mg total) by mouth daily as directed by the coumadin  clinic. 60 tablet 2   acetaminophen  (TYLENOL ) 325 MG tablet Take 2 tablets (650 mg total) by mouth every 6 (six) hours as needed for fever.     atorvastatin  (LIPITOR) 80 MG tablet Take 40 mg by  mouth at bedtime.      Calcium  Carbonate-Vitamin D  500-125 MG-UNIT TABS Take 1 tablet by mouth 2 (two) times daily.     No current facility-administered medications for this visit.    ALLERGIES:   Patient has no known allergies.   SOCIAL HISTORY:  The patient  reports that he has never smoked. He has never used smokeless tobacco. He reports current alcohol use. He reports that he does not currently use drugs after having used the following drugs: Marijuana.   FAMILY HISTORY:  The patient's family history includes Cancer in his father; Stomach cancer in his mother and sister.   REVIEW OF SYSTEMS:  Positive for fatigue, knee pain.   All other systems are reviewed and negative.   PHYSICAL EXAM: VS:  BP 120/84   Pulse 70   Ht 5' 4 (1.626 m)   Wt 141 lb 9.6 oz (64.2 kg)   SpO2 97%   BMI 24.31 kg/m  , BMI Body mass index is 24.31 kg/m. GEN: Well nourished, well developed, in no acute distress HEENT: normal Neck: No JVD. carotids 2+ without bruits or masses Cardiac: The heart is RRR with a 3/6 harsh systolic murmur heard loudest at the apex but present throughout the precordium Respiratory:  clear to auscultation bilaterally GI: soft, nontender, nondistended, + BS MS: no deformity or atrophy Skin: warm and dry, no rash Neuro:  Strength and sensation are intact Psych: euthymic mood, full affect  EKG:  EKG from today reviewed and demonstrates AV sequential pacing  RECENT LABS: 12/30/2023: ALT 13; BUN 26; Creatinine, Ser 1.02; Potassium 4.2; Sodium 140 02/10/2024: Hemoglobin 10.9; Platelets 194.0  No results found for requested labs within last 365 days.   CrCl cannot be calculated (Patient's most recent lab result is older than the maximum 21 days allowed.).   Wt Readings from Last 3 Encounters:  03/29/24 141 lb 9.6 oz (64.2 kg)  03/13/24 134 lb (60.8 kg)  02/10/24 138 lb (62.6 kg)     CARDIAC STUDIES: Cardiac Studies & Procedures    ______________________________________________________________________________________________   STRESS TESTS  EXERCISE TOLERANCE TEST (ETT) 12/01/2016  Interpretation Summary  The patient walked for 3:29 of a Bruce protocol GXT. Peak HR of 117 which is 78% predicted maximal HR.  The patient is in atrial fib and is V paced. ST segments were not evaluated.  There was a controlled / gradual increase in HR with exercise   ECHOCARDIOGRAM  ECHOCARDIOGRAM COMPLETE 03/23/2024  Narrative ECHOCARDIOGRAM REPORT    Patient Name:   Phillip Kline Date of Exam: 03/23/2024 Medical Rec #:  [id number]  Height:       64.0 in Accession #:    [id number]       Weight:       134.0 lb Date of Birth:  [birth year]        BSA:          1.650 m Patient Age:    56 years         BP:           133/87 mmHg Patient Gender: M                HR:           70 bpm. Exam Location:  [address]  Procedure: 2D Echo, 3D Echo and Strain Analysis (Both Spectral and Color Flow Doppler were utilized during procedure).  Indications:    I35.0 Nonrheumatic aortic (valve) stenosis  History:        Patient has prior history of Echocardiogram examinations, most recent 12/23/2023. Pacemaker, Arrythmias:Atrial Fibrillation; Risk Factors:Hypertension and Dyslipidemia. Sinus node dysfunction. Coronary atherosclerosis.  Sonographer:    Jon Hacker RCS Referring Phys: [phone] Roddy Bellamy  IMPRESSIONS   1. Left ventricular ejection fraction, by estimation, is 45 to 50%. Left ventricular ejection fraction by 3D volume is 51 %. The left ventricle has mildly decreased function. The left ventricle demonstrates global hypokinesis. There is mild concentric left ventricular hypertrophy. Left ventricular diastolic parameters are consistent with Grade III diastolic dysfunction (restrictive). Elevated left atrial pressure. The average left ventricular global longitudinal strain is -19.8 %. The global longitudinal strain is  normal. 2. Right ventricular systolic function is moderately reduced. The right ventricular size is mildly enlarged. There is moderately elevated pulmonary artery systolic pressure. The estimated right ventricular systolic pressure is 58.8 mmHg. 3. Right atrial size was Massively dilated. 4. Left atrial size was Massively dilated. 5. The mitral valve is myxomatous. Moderate to severe mitral valve regurgitation. No evidence of mitral stenosis. There is mild late systolic prolapse of both leaflets of the mitral valve. MR likely functional from massive LA dilateation. 6. Tricuspid valve regurgitation is severe. 7. The aortic valve is calcified. There is severe calcifcation of the aortic valve. There is severe thickening of the aortic valve. Aortic valve regurgitation is not visualized. Moderate aortic valve stenosis. Aortic valve area, by VTI measures 0.96 cm. Aortic valve mean gradient measures 12.5 mmHg. Aortic valve Vmax measures 2.45 m/s. DI 0.31, SVI 26. Findings c/w paradoxical low flow low gradient moderate AS. 8. Aortic dilatation noted. There is mild dilatation of the ascending aorta, measuring 38 mm. 9. The inferior vena cava is dilated in size with <50% respiratory variability, suggesting right atrial pressure of 15 mmHg. 10. Compared to study dated 2/29/2025, there is no significant change in AS and MR. LVF has declined.  FINDINGS Left Ventricle: Left ventricular ejection fraction, by estimation, is 45 to 50%. Left ventricular ejection fraction by 3D volume is 51 %. The left ventricle has mildly decreased function. The left ventricle demonstrates global hypokinesis. The average left ventricular global longitudinal strain is -19.8 %. Strain was performed and the global longitudinal strain is normal. The left ventricular internal cavity size was normal in size. There is mild concentric left ventricular hypertrophy. Left ventricular diastolic parameters are consistent with Grade III diastolic  dysfunction (restrictive). Elevated left atrial pressure.  Right Ventricle: The right ventricular size is mildly enlarged. No increase in right ventricular wall thickness. Right ventricular systolic function is moderately reduced. There is moderately elevated pulmonary artery systolic pressure.  The tricuspid regurgitant velocity is 3.31 m/s, and with an assumed right atrial pressure of 15 mmHg, the estimated right ventricular systolic pressure is 58.8 mmHg.  Left Atrium: Left atrial size was Massively dilated.  Right Atrium: Right atrial size was Massively dilated.  Pericardium: There is no evidence of pericardial effusion.  Mitral Valve: The mitral valve is myxomatous. There is mild late systolic prolapse of both leaflets of the mitral valve. There is moderate thickening of the mitral valve leaflet(s). Moderate mitral annular calcification. Moderate to severe mitral valve regurgitation. No evidence of mitral valve stenosis.  Tricuspid Valve: The tricuspid valve is normal in structure. Tricuspid valve regurgitation is severe. No evidence of tricuspid stenosis.  Aortic Valve: The aortic valve is calcified. There is severe calcifcation of the aortic valve. There is severe thickening of the aortic valve. Aortic valve regurgitation is not visualized. Moderate aortic stenosis is present. Aortic valve mean gradient measures 12.5 mmHg. Aortic valve peak gradient measures 24.0 mmHg. Aortic valve area, by VTI measures 0.96 cm.  Pulmonic Valve: The pulmonic valve was normal in structure. Pulmonic valve regurgitation is mild to moderate. No evidence of pulmonic stenosis.  Aorta: Aortic dilatation noted. There is mild dilatation of the ascending aorta, measuring 38 mm.  Venous: The inferior vena cava is dilated in size with less than 50% respiratory variability, suggesting right atrial pressure of 15 mmHg.  IAS/Shunts: No atrial level shunt detected by color flow Doppler.  Additional Comments: 3D was  performed not requiring image post processing on an independent workstation and was normal. A device lead is visualized.   LEFT VENTRICLE PLAX 2D LVIDd:         5.81 cm         Diastology LVIDs:         4.28 cm         LV e' medial:    8.16 cm/s LV PW:         1.39 cm         LV E/e' medial:  14.8 LV IVS:        1.12 cm         LV e' lateral:   10.20 cm/s LVOT diam:     2.00 cm         LV E/e' lateral: 11.9 LV SV:         43 LV SV Index:   26              2D Longitudinal LVOT Area:     3.14 cm        Strain 2D Strain GLS   -18.3 % (A4C): 2D Strain GLS   -26.7 % (A3C): 2D Strain GLS   -14.3 % (A2C): 2D Strain GLS   -19.8 % Avg:  3D Volume EF LV 3D EF:    Left ventricul ar ejection fraction by 3D volume is 51 %.  3D Volume EF: 3D EF:        51 % LV EDV:       150 ml LV ESV:       73 ml LV SV:        76 ml  RIGHT VENTRICLE RV Basal diam:  4.01 cm RV S prime:     8.49 cm/s TAPSE (M-mode): 1.0 cm RVSP:           58.8 mmHg  LEFT ATRIUM              Index  RIGHT ATRIUM            Index LA diam:        7.00 cm  4.24 cm/m    RA Pressure: 15.00 mmHg LA Vol (A2C):   208.0 ml 126.05 ml/m  RA Area:     28.50 cm LA Vol (A4C):   206.0 ml 124.83 ml/m  RA Volume:   99.90 ml   60.54 ml/m LA Biplane Vol: 211.0 ml 127.86 ml/m AORTIC VALVE AV Area (Vmax):    1.01 cm AV Area (Vmean):   1.00 cm AV Area (VTI):     0.96 cm AV Vmax:           245.00 cm/s AV Vmean:          162.500 cm/s AV VTI:            0.450 m AV Peak Grad:      24.0 mmHg AV Mean Grad:      12.5 mmHg LVOT Vmax:         78.40 cm/s LVOT Vmean:        51.900 cm/s LVOT VTI:          0.138 m LVOT/AV VTI ratio: 0.31  AORTA Ao Root diam: 3.40 cm Ao Asc diam:  3.80 cm  MV E velocity: 121.00 cm/s  TRICUSPID VALVE MV A velocity: 42.80 cm/s   TR Peak grad:   43.8 mmHg MV E/A ratio:  2.83         TR Vmax:        331.00 cm/s Estimated RAP:  15.00 mmHg RVSP:           58.8 mmHg  SHUNTS Systemic  VTI:  0.14 m Systemic Diam: 2.00 cm  Wilbert Bihari MD Electronically signed by Wilbert Bihari MD Signature Date/Time: 03/23/2024/11:54:14 PM    Final          ______________________________________________________________________________________________      ASSESSMENT AND PLAN: 1.  Moderate low-flow low gradient aortic stenosis 2.  Severe mitral regurgitation 3.  Severe tricuspid regurgitation 4.  Paroxysmal atrial fibrillation on warfarin 5.  Heart failure with mildly reduced ejection fraction (HFmrEF)  The patient's recent echo was reviewed and shows an LVEF of 45 to 50% with mild global hypokinesis of the left ventricle, mild LVH, and grade 3 restrictive diastolic dysfunction.  RV function is moderately reduced and PA pressure is elevated, estimated at 59 mmHg.  There is massive biatrial enlargement.  The mitral valve exhibits  moderate to severe mitral regurgitation and is described as myxomatous.  The aortic valve is severely calcified with moderate aortic stenosis, mean gradient 13 mmHg, dimensionless index 0.31, low stroke-volume index of 26.  By my review he has both severe mitral regurgitation and severe tricuspid regurgitation.  He reports NYHA functional class II limitation.  I have reviewed the natural history of aortic stenosis with the patient and their family members who are present today. We have discussed the limitations of medical therapy and the poor prognosis associated with symptomatic aortic stenosis. We have reviewed potential treatment options, including palliative medical therapy, conventional surgical aortic valve replacement, and transcatheter aortic valve replacement. We discussed treatment options in the context of the patient's specific comorbid medical conditions.  The patient understands his situation is more complicated by the presence of both severe mitral and tricuspid regurgitation.  I suspect the primary mechanism of his mitral and tricuspid regurgitation  is atrial functional as he has massive biatrial enlargement.  I am not sure  that he truly has prolapse but flap closure of the AV valves because of the severe atrial dilatation.  He has concerning findings of restrictive diastolic filling on his echo study.  Infiltrative disease and amyloidosis should be considered in his differential diagnosis.  Think that he is a candidate for MRI due to his pacemaker.  I have recommended starting with a right left heart catheterization for full hemodynamic assessment of his valvular heart disease.  He should also have a transesophageal echo to better assess the functional anatomy of his mitral and tricuspid valves.  I reviewed risks, indications, and alternatives to both cardiac catheterization and possible PCI as well as transesophageal echo.  The patient understands and agrees to proceed with the studies as next steps in his evaluation.  Once his studies are completed, I will likely refer him for formal heart failure consultation and we will also review his case in our structural heart team meeting.  Informed Consent   Shared Decision Making/Informed Consent   The risks [esophageal damage, perforation (1:10,000 risk), bleeding, pharyngeal hematoma as well as other potential complications associated with conscious sedation including aspiration, arrhythmia, respiratory failure and death], benefits (treatment guidance and diagnostic support) and alternatives of a transesophageal echocardiogram were discussed in detail with Phillip Kline and he is willing to proceed.     Informed Consent   Shared Decision Making/Informed Consent The risks [stroke (1 in 1000), death (1 in 1000), kidney failure [usually temporary] (1 in 500), bleeding (1 in 200), allergic reaction [possibly serious] (1 in 200)], benefits (diagnostic support and management of coronary artery disease) and alternatives of a cardiac catheterization were discussed in detail with Phillip Kline and he is willing to  proceed.      Phillip Kline 03/29/2024 1:23 PM

## 2024-03-29 NOTE — Patient Instructions (Addendum)
 Medication Instructions:  Your physician recommends that you continue on your current medications as directed. Please refer to the Current Medication list given to you today.  *If you need a refill on your cardiac medications before your next appointment, please call your pharmacy*  Lab Work: CBC and Bmet -- Today  You may go to any [facility] Location for your lab work:  [facility] - [address] ([facility]) - [address] [phone] [address]  [place] - [address]   [place]  - [address]   [place] - [address] - [address] [employer]  - [address] ([facility]   If you have labs (blood work) drawn today and your tests are completely normal, you will receive your results only by: [employer] (if you have MyChart)  If you have any lab test that is abnormal or we need to change your treatment, we will call you or send a MyChart message to review the results.  Testing/Procedures:     Dear Carlin JONELLE Benders  You are scheduled for a TEE (Transesophageal Echocardiogram) and Left and Right Heart Catheterization on Monday, September 15 with Dr. Delford.  Please arrive at the [facility] (Main Entrance A) at [facility]: [address] [place], [place] [address] at 8:30 AM (This time is 1 hour(s) before your procedure to ensure your preparation).   Free valet parking service is available. You will check in at ADMITTING.   *Please Note: You will receive a call the day before your procedure to confirm the appointment time. That time may have changed from the original time based on the schedule for that day.*    DIET:  Nothing to eat or drink after midnight except a sip of water with medications (see medication instructions below)  MEDICATION INSTRUCTIONS: !!IF ANY NEW MEDICATIONS ARE STARTED AFTER TODAY, PLEASE NOTIFY YOUR  PROVIDER AS SOON AS POSSIBLE!!  FYI: Medications such as Semaglutide (Ozempic, [place]), Tirzepatide (Mounjaro, Zepbound), Dulaglutide (Trulicity), etc (GLP1 agonists) AND Canagliflozin (Invokana), Dapagliflozin (Farxiga), Empagliflozin (Jardiance), Ertugliflozin (Steglatro), Bexagliflozin [employer]) or any combination with one of these drugs such as Invokamet (Canagliflozin/Metformin), Synjardy (Empagliflozin/Metformin), etc (SGLT2 inhibitors) must be held around the time of a procedure. This is not a comprehensive list of all of these drugs. Please review all of your medications and talk to your provider if you take any one of these. If you are not sure, ask your provider.    Please hold your anticoagulant (blood thinner): Warfarin (Coumadin ) for 5 days prior to procedure. Your last dose will be Tuesday Sept 9th.     LABS: Today  FYI:  For your safety, and to allow us  to monitor your vital signs accurately during the surgery/procedure we request: If you have artificial nails, gel coating, SNS etc, please have those removed prior to your surgery/procedure. Not having the nail coverings /polish removed may result in cancellation or delay of your surgery/procedure.  Your support person will be asked to wait in the waiting room during your procedure.  It is OK to have someone drop you off and come back when you are ready to be discharged.  You cannot drive after the procedure and will need someone to drive you home.  Bring your insurance cards.  *Special Note: Every effort is made to have your procedure done on time.  Occasionally there are emergencies that occur at the hospital that may cause delays. Please be patient if a delay does occur.      Follow-Up: At [facility], you and your health needs are our priority.  As part of our continuing mission to provide you with exceptional heart care, we have created designated Provider Care Teams.  These Care Teams include your primary  Cardiologist (physician) and Advanced Practice Providers (APPs -  Physician Assistants and Nurse Practitioners) who all work together to provide you with the care you need, when you need it.  Your next appointment:   To be scheduled  The format for your next appointment:   In Person  Provider:   [name], MD

## 2024-03-30 ENCOUNTER — Ambulatory Visit: Payer: Self-pay | Admitting: Cardiovascular Disease

## 2024-03-30 LAB — CBC
Hematocrit: 41.2 % (ref 37.5–51.0)
Hemoglobin: 12.9 g/dL — ABNORMAL LOW (ref 13.0–17.7)
MCH: 30.8 pg (ref 26.6–33.0)
MCHC: 31.3 g/dL — ABNORMAL LOW (ref 31.5–35.7)
MCV: 98 fL — ABNORMAL HIGH (ref 79–97)
Platelets: 157 x10E3/uL (ref 150–450)
RBC: 4.19 x10E6/uL (ref 4.14–5.80)
RDW: 17.5 % — ABNORMAL HIGH (ref 11.6–15.4)
WBC: 7.1 x10E3/uL (ref 3.4–10.8)

## 2024-03-30 LAB — BASIC METABOLIC PANEL WITH GFR
BUN/Creatinine Ratio: 28 — AB (ref 10–24)
BUN: 28 mg/dL — AB (ref 8–27)
CO2: 26 mmol/L (ref 20–29)
Calcium: 9.7 mg/dL (ref 8.6–10.2)
Chloride: 99 mmol/L (ref 96–106)
Creatinine, Ser: 1 mg/dL (ref 0.76–1.27)
Glucose: 79 mg/dL (ref 70–99)
Potassium: 5.3 mmol/L — AB (ref 3.5–5.2)
Sodium: 138 mmol/L (ref 134–144)
eGFR: 77 mL/min/1.73 (ref >59)

## 2024-03-31 ENCOUNTER — Telehealth: Payer: Self-pay | Admitting: Cardiovascular Disease

## 2024-03-31 NOTE — Telephone Encounter (Signed)
 Patient is calling in to see if he left his necklace in our office when he his appt on 9/3. Please advise

## 2024-04-03 ENCOUNTER — Telehealth: Payer: Self-pay | Admitting: Cardiovascular Disease

## 2024-04-03 DIAGNOSIS — I4891 Unspecified atrial fibrillation: Secondary | ICD-10-CM

## 2024-04-03 MED ORDER — WARFARIN SODIUM 2 MG PO TABS: 2.0000 mg | ORAL_TABLET | Freq: Every day | ORAL | 2 refills | Status: DC

## 2024-04-03 NOTE — Telephone Encounter (Signed)
 Refill request for warfarin:  Last INR was 2.1 on 03/07/24 Next INR due 04/18/24 LOV was 03/29/24  Refill approved.

## 2024-04-03 NOTE — Telephone Encounter (Signed)
*  STAT* If patient is at the pharmacy, call can be transferred to refill team.   1. Which medications need to be refilled? (please list name of each medication and dose if known) warfarin (COUMADIN ) 2 MG tablet    2. Would you like to learn more about the convenience, safety, & potential cost savings by using the [facility]?No   3. Are you open to using the [facility] (Type [facility]. No    4. Which pharmacy/location (including street and city if local pharmacy) is medication to be sent to?[id number] - [place], [place] - [address] AT [facility] [address]    5. Do they need a 30 day or 90 day supply? 90 day   Pts current MD is [place].

## 2024-04-07 NOTE — Progress Notes (Signed)
 Attempted phone call regarding patient TEE appointment. Pt did not answer at this time; unable to leave voicemail as mailbox is full. No alternative contact option found for this patient.

## 2024-04-10 ENCOUNTER — Encounter (HOSPITAL_COMMUNITY)
Admission: RE | Disposition: A | Discharge status: Home / Self Care | Payer: Self-pay | Source: Home / Self Care | Attending: Cardiovascular Disease

## 2024-04-10 ENCOUNTER — Telehealth: Payer: Self-pay

## 2024-04-10 ENCOUNTER — Ambulatory Visit (HOSPITAL_COMMUNITY)
Admission: RE | Admit: 2024-04-10 | Discharge: 2024-04-10 | Disposition: A | Discharge status: Home / Self Care | Attending: Cardiovascular Disease | Admitting: Cardiovascular Disease

## 2024-04-10 ENCOUNTER — Other Ambulatory Visit: Payer: Self-pay

## 2024-04-10 ENCOUNTER — Ambulatory Visit (HOSPITAL_COMMUNITY)
Admission: RE | Admit: 2024-04-10 | Discharge: 2024-04-10 | Disposition: A | Discharge status: Home / Self Care | Source: Ambulatory Visit | Attending: Cardiovascular Disease | Admitting: Cardiovascular Disease

## 2024-04-10 ENCOUNTER — Ambulatory Visit (HOSPITAL_COMMUNITY): Admitting: Anesthesiology

## 2024-04-10 ENCOUNTER — Encounter (HOSPITAL_COMMUNITY): Payer: Self-pay | Admitting: Cardiovascular Disease

## 2024-04-10 DIAGNOSIS — I110 Hypertensive heart disease with heart failure: Secondary | ICD-10-CM | POA: Insufficient documentation

## 2024-04-10 DIAGNOSIS — Z950 Presence of cardiac pacemaker: Secondary | ICD-10-CM | POA: Insufficient documentation

## 2024-04-10 DIAGNOSIS — Z79899 Other long term (current) drug therapy: Secondary | ICD-10-CM | POA: Insufficient documentation

## 2024-04-10 DIAGNOSIS — Z5181 Encounter for therapeutic drug level monitoring: Secondary | ICD-10-CM

## 2024-04-10 DIAGNOSIS — I2510 Atherosclerotic heart disease of native coronary artery without angina pectoris: Secondary | ICD-10-CM

## 2024-04-10 DIAGNOSIS — G4730 Sleep apnea, unspecified: Secondary | ICD-10-CM | POA: Diagnosis not present

## 2024-04-10 DIAGNOSIS — I10 Essential (primary) hypertension: Secondary | ICD-10-CM

## 2024-04-10 DIAGNOSIS — I350 Nonrheumatic aortic (valve) stenosis: Secondary | ICD-10-CM

## 2024-04-10 DIAGNOSIS — F418 Other specified anxiety disorders: Secondary | ICD-10-CM

## 2024-04-10 DIAGNOSIS — I071 Rheumatic tricuspid insufficiency: Secondary | ICD-10-CM

## 2024-04-10 DIAGNOSIS — I340 Nonrheumatic mitral (valve) insufficiency: Secondary | ICD-10-CM

## 2024-04-10 DIAGNOSIS — I495 Sick sinus syndrome: Secondary | ICD-10-CM | POA: Insufficient documentation

## 2024-04-10 DIAGNOSIS — I5022 Chronic systolic (congestive) heart failure: Secondary | ICD-10-CM | POA: Insufficient documentation

## 2024-04-10 DIAGNOSIS — Z7901 Long term (current) use of anticoagulants: Secondary | ICD-10-CM | POA: Diagnosis not present

## 2024-04-10 DIAGNOSIS — I083 Combined rheumatic disorders of mitral, aortic and tricuspid valves: Secondary | ICD-10-CM | POA: Diagnosis present

## 2024-04-10 DIAGNOSIS — I4821 Permanent atrial fibrillation: Secondary | ICD-10-CM | POA: Diagnosis not present

## 2024-04-10 DIAGNOSIS — Q2112 Patent foramen ovale: Secondary | ICD-10-CM | POA: Diagnosis not present

## 2024-04-10 HISTORY — PX: TRANSESOPHAGEAL ECHOCARDIOGRAM (CATH LAB): EP1270

## 2024-04-10 HISTORY — PX: RIGHT/LEFT HEART CATH AND CORONARY ANGIOGRAPHY: CATH118266

## 2024-04-10 LAB — PROTIME-INR
INR: 1.3 — ABNORMAL HIGH (ref 0.8–1.2)
Prothrombin Time: 16.6 s — ABNORMAL HIGH (ref 11.4–15.2)

## 2024-04-10 LAB — ECHO TEE
AR max vel: 0.76 cm2
AV Area VTI: 0.67 cm2
AV Area mean vel: 0.7 cm2
AV Mean grad: 7 mmHg
AV Peak grad: 12.1 mmHg
Ao pk vel: 1.74 m/s
MV VTI: 0.98 cm2

## 2024-04-10 LAB — POCT I-STAT 7, (LYTES, BLD GAS, ICA,H+H)
Acid-base deficit: 2 mmol/L (ref 0.0–2.0)
Bicarbonate: 25.1 mmol/L (ref 20.0–28.0)
Calcium, Ion: 1.17 mmol/L (ref 1.15–1.40)
HCT: 40 % (ref 39.0–52.0)
Hemoglobin: 13.6 g/dL (ref 13.0–17.0)
O2 Saturation: 95 %
Potassium: 4.3 mmol/L (ref 3.5–5.1)
Sodium: 134 mmol/L — ABNORMAL LOW (ref 135–145)
TCO2: 27 mmol/L (ref 22–32)
pCO2 arterial: 51.2 mmHg — ABNORMAL HIGH (ref 32–48)
pH, Arterial: 7.298 — ABNORMAL LOW (ref 7.35–7.45)
pO2, Arterial: 85 mmHg (ref 83–108)

## 2024-04-10 LAB — POCT I-STAT EG7
Acid-Base Excess: 1 mmol/L (ref 0.0–2.0)
Bicarbonate: 28 mmol/L (ref 20.0–28.0)
Calcium, Ion: 1.2 mmol/L (ref 1.15–1.40)
HCT: 40 % (ref 39.0–52.0)
Hemoglobin: 13.6 g/dL (ref 13.0–17.0)
O2 Saturation: 63 %
Potassium: 4.5 mmol/L (ref 3.5–5.1)
Sodium: 140 mmol/L (ref 135–145)
TCO2: 30 mmol/L (ref 22–32)
pCO2, Ven: 55.3 mmHg (ref 44–60)
pH, Ven: 7.313 (ref 7.25–7.43)
pO2, Ven: 36 mmHg (ref 32–45)

## 2024-04-10 SURGERY — RIGHT/LEFT HEART CATH AND CORONARY ANGIOGRAPHY
Anesthesia: LOCAL

## 2024-04-10 SURGERY — TRANSESOPHAGEAL ECHOCARDIOGRAM (TEE) (CATHLAB)
Anesthesia: Monitor Anesthesia Care

## 2024-04-10 MED ORDER — SODIUM CHLORIDE 0.9% FLUSH: 3.0000 mL | Freq: Two times a day (BID) | INTRAVENOUS | Status: DC

## 2024-04-10 MED ORDER — HYDRALAZINE HCL 20 MG/ML IJ SOLN: 10.0000 mg | INTRAMUSCULAR | Status: DC | PRN

## 2024-04-10 MED ORDER — MIDAZOLAM HCL 2 MG/2ML IJ SOLN
INTRAMUSCULAR | Status: DC | PRN
  Administered 2024-04-10: 1 mg via INTRAVENOUS

## 2024-04-10 MED ORDER — FENTANYL CITRATE (PF) 100 MCG/2ML IJ SOLN
INTRAMUSCULAR | Status: AC
  Filled 2024-04-10: qty 2

## 2024-04-10 MED ORDER — HEPARIN (PORCINE) IN NACL 1000-0.9 UT/500ML-% IV SOLN
INTRAVENOUS | Status: DC | PRN
  Administered 2024-04-10 (×2): 500 mL via INTRA_ARTERIAL

## 2024-04-10 MED ORDER — MIDAZOLAM HCL 2 MG/2ML IJ SOLN
INTRAMUSCULAR | Status: AC
  Filled 2024-04-10: qty 2

## 2024-04-10 MED ORDER — LIDOCAINE HCL (PF) 1 % IJ SOLN
INTRAMUSCULAR | Status: AC
  Filled 2024-04-10: qty 30

## 2024-04-10 MED ORDER — SODIUM CHLORIDE 0.9 % IV SOLN: 250.0000 mL | INTRAVENOUS | Status: DC | PRN

## 2024-04-10 MED ORDER — FREE WATER: 500.0000 mL | Freq: Once | Status: DC

## 2024-04-10 MED ORDER — LIDOCAINE HCL (PF) 1 % IJ SOLN
INTRAMUSCULAR | Status: DC | PRN
  Administered 2024-04-10: 10 mL
  Administered 2024-04-10: 5 mL

## 2024-04-10 MED ORDER — ASPIRIN 81 MG PO CHEW: 81.0000 mg | CHEWABLE_TABLET | ORAL | Status: DC

## 2024-04-10 MED ORDER — PROPOFOL 10 MG/ML IV BOLUS
INTRAVENOUS | Status: DC | PRN
  Administered 2024-04-10 (×2): 50 mg via INTRAVENOUS
  Administered 2024-04-10: 40 mg via INTRAVENOUS

## 2024-04-10 MED ORDER — SODIUM CHLORIDE 0.9 % IV SOLN: INTRAVENOUS | Status: DC

## 2024-04-10 MED ORDER — SODIUM CHLORIDE 0.9% FLUSH: 3.0000 mL | INTRAVENOUS | Status: DC | PRN

## 2024-04-10 MED ORDER — PROPOFOL 500 MG/50ML IV EMUL
INTRAVENOUS | Status: DC | PRN
  Administered 2024-04-10: 125 ug/kg/min via INTRAVENOUS

## 2024-04-10 MED ORDER — HEPARIN SODIUM (PORCINE) 1000 UNIT/ML IJ SOLN
INTRAMUSCULAR | Status: AC
  Filled 2024-04-10: qty 10

## 2024-04-10 MED ORDER — SUGAMMADEX SODIUM 200 MG/2ML IV SOLN
INTRAVENOUS | Status: DC | PRN
  Administered 2024-04-10: 240 mg via INTRAVENOUS

## 2024-04-10 MED ORDER — DEXAMETHASONE SODIUM PHOSPHATE 10 MG/ML IJ SOLN
INTRAMUSCULAR | Status: DC | PRN
  Administered 2024-04-10: 10 mg via INTRAVENOUS

## 2024-04-10 MED ORDER — FENTANYL CITRATE (PF) 100 MCG/2ML IJ SOLN
INTRAMUSCULAR | Status: DC | PRN
  Administered 2024-04-10: 25 ug via INTRAVENOUS

## 2024-04-10 MED ORDER — VERAPAMIL HCL 2.5 MG/ML IV SOLN
INTRAVENOUS | Status: AC
  Filled 2024-04-10: qty 2

## 2024-04-10 MED ORDER — SODIUM CHLORIDE 0.9 % IV SOLN
250.0000 mL | INTRAVENOUS | Status: DC | PRN
Start: 2024-04-10 — End: 2024-04-10

## 2024-04-10 MED ORDER — IOHEXOL 350 MG/ML SOLN
INTRAVENOUS | Status: DC | PRN
  Administered 2024-04-10: 60 mL via INTRA_ARTERIAL

## 2024-04-10 MED ORDER — LIDOCAINE 2% (20 MG/ML) 5 ML SYRINGE
INTRAMUSCULAR | Status: DC | PRN
  Administered 2024-04-10: 100 mg via INTRAVENOUS

## 2024-04-10 MED ORDER — ONDANSETRON HCL 4 MG/2ML IJ SOLN: 4.0000 mg | Freq: Four times a day (QID) | INTRAMUSCULAR | Status: DC | PRN

## 2024-04-10 MED ORDER — LABETALOL HCL 5 MG/ML IV SOLN: 10.0000 mg | INTRAVENOUS | Status: DC | PRN

## 2024-04-10 MED ORDER — ACETAMINOPHEN 325 MG PO TABS
650.0000 mg | ORAL_TABLET | ORAL | Status: DC | PRN
  Administered 2024-04-10: 650 mg via ORAL
  Filled 2024-04-10: qty 2

## 2024-04-10 MED ORDER — ROCURONIUM BROMIDE 100 MG/10ML IV SOLN
INTRAVENOUS | Status: DC | PRN
Start: 2024-04-10 — End: 2024-04-10
  Administered 2024-04-10: 40 mg via INTRAVENOUS

## 2024-04-10 SURGICAL SUPPLY — 14 items
CATH BALLN WEDGE 5F 110CM (CATHETERS) IMPLANT
CATH INFINITI 5 FR AL2 (CATHETERS) IMPLANT
CATH INFINITI 5FR MULTPACK ANG (CATHETERS) IMPLANT
CLOSURE MYNX CONTROL 5F (Vascular Products) IMPLANT
COVER PRB 48X5XTLSCP FOLD TPE (BAG) IMPLANT
GLIDESHEATH SLEND SS 6F .021 (SHEATH) IMPLANT
GUIDEWIRE INQWIRE 1.5J.035X260 (WIRE) IMPLANT
KIT MICROPUNCTURE NIT STIFF (SHEATH) IMPLANT
PACK CARDIAC CATHETERIZATION (CUSTOM PROCEDURE TRAY) ×1 IMPLANT
SET ATX-X65L (MISCELLANEOUS) IMPLANT
SHEATH GLIDE SLENDER 4/5FR (SHEATH) IMPLANT
SHEATH PINNACLE 5F 10CM (SHEATH) IMPLANT
WIRE EMERALD 3MM-J .025X260CM (WIRE) IMPLANT
WIRE EMERALD ST .035X260CM (WIRE) IMPLANT

## 2024-04-10 NOTE — Telephone Encounter (Signed)
 Referral placed.

## 2024-04-10 NOTE — Progress Notes (Signed)
 Patient given discharge packet, educated on discharge instructions. No further questions at this time, patient states he understands instructions. [place] RN called sister Catarino and verified discharge instructions.

## 2024-04-10 NOTE — Anesthesia Procedure Notes (Signed)
 Procedure Name: Intubation Date/Time: 04/10/2024 10:39 AM  Performed by: Genny Gun, CRNAPre-anesthesia Checklist: Patient identified, Emergency Drugs available, Suction available, Patient being monitored and Timeout performed Patient Re-evaluated:Patient Re-evaluated prior to induction Oxygen Delivery Method: Circle system utilized Preoxygenation: Pre-oxygenation with 100% oxygen Induction Type: IV induction Ventilation: Mask ventilation without difficulty Laryngoscope Size: Mac and 4 Grade View: Grade I Tube type: Oral Tube size: 7.5 mm Number of attempts: 1 Placement Confirmation: ETT inserted through vocal cords under direct vision, positive ETCO2 and breath sounds checked- equal and bilateral Secured at: 21 cm Tube secured with: Tape Dental Injury: Teeth and Oropharynx as per pre-operative assessment

## 2024-04-10 NOTE — Transfer of Care (Signed)
 Immediate Anesthesia Transfer of Care Note  Patient: Phillip Kline  Procedure(s) Performed: TRANSESOPHAGEAL ECHOCARDIOGRAM ECHO TEE  Patient Location: Cath Lab  Anesthesia Type:General  Level of Consciousness: drowsy and patient cooperative  Airway & Oxygen Therapy: Patient Spontanous Breathing and Patient connected to face mask oxygen  Post-op Assessment: Report given to RN and Post -op Vital signs reviewed and stable  Post vital signs: Reviewed and stable  Last Vitals:  Vitals Value Taken Time  BP 149/89 04/10/24 11:13  Temp    Pulse 75 04/10/24 11:14  Resp 19 04/10/24 11:14  SpO2 100 % 04/10/24 11:14  Vitals shown include unfiled device data.  Last Pain:  Vitals:   04/10/24 1113  TempSrc:   PainSc: Asleep         Complications: No notable events documented.

## 2024-04-10 NOTE — Anesthesia Preprocedure Evaluation (Addendum)
 Anesthesia Evaluation  Patient identified by MRN, date of birth, ID band Patient awake    Reviewed: Allergy & Precautions, NPO status , Patient's Chart, lab work & pertinent test results, reviewed documented beta blocker date and time   History of Anesthesia Complications Negative for: history of anesthetic complications  Airway Mallampati: II       Dental no notable dental hx.    Pulmonary sleep apnea and Continuous Positive Airway Pressure Ventilation , neg COPD   breath sounds clear to auscultation       Cardiovascular hypertension, + CAD  + pacemaker + Valvular Problems/Murmurs AS and MR  Rhythm:Regular Rate:Normal + Systolic murmurs IMPRESSIONS     1. Left ventricular ejection fraction, by estimation, is 45 to 50%. Left  ventricular ejection fraction by 3D volume is 51 %. The left ventricle has  mildly decreased function. The left ventricle demonstrates global  hypokinesis. There is mild concentric  left ventricular hypertrophy. Left ventricular diastolic parameters are  consistent with Grade III diastolic dysfunction (restrictive). Elevated  left atrial pressure. The average left ventricular global longitudinal  strain is -19.8 %. The global  longitudinal strain is normal.   2. Right ventricular systolic function is moderately reduced. The right  ventricular size is mildly enlarged. There is moderately elevated  pulmonary artery systolic pressure. The estimated right ventricular  systolic pressure is 58.8 mmHg.   3. Right atrial size was Massively dilated.   4. Left atrial size was Massively dilated.   5. The mitral valve is myxomatous. Moderate to severe mitral valve  regurgitation. No evidence of mitral stenosis. There is mild late systolic  prolapse of both leaflets of the mitral valve. MR likely functional from  massive LA dilateation.   6. Tricuspid valve regurgitation is severe.   7. The aortic valve is  calcified. There is severe calcifcation of the  aortic valve. There is severe thickening of the aortic valve. Aortic valve  regurgitation is not visualized. Moderate aortic valve stenosis. Aortic  valve area, by VTI measures 0.96  cm. Aortic valve mean gradient measures 12.5 mmHg. Aortic valve Vmax  measures 2.45 m/s. DI 0.31, SVI 26. Findings c/w paradoxical low flow low  gradient moderate AS.   8. Aortic dilatation noted. There is mild dilatation of the ascending  aorta, measuring 38 mm.   9. The inferior vena cava is dilated in size with <50% respiratory  variability, suggesting right atrial pressure of 15 mmHg.  10. Compared to study dated 2/29/2025, there is no significant change in  AS and MR. LVF has declined.      Neuro/Psych neg Seizures PSYCHIATRIC DISORDERS Anxiety Depression       GI/Hepatic ,,,(+) neg Cirrhosis        Endo/Other    Renal/GU Renal disease     Musculoskeletal  (+) Arthritis ,    Abdominal   Peds  Hematology   Anesthesia Other Findings   Reproductive/Obstetrics                              Anesthesia Physical Anesthesia Plan  ASA: 4  Anesthesia Plan: MAC   Post-op Pain Management:    Induction: Intravenous  PONV Risk Score and Plan: 2 and Ondansetron   Airway Management Planned: Simple Face Mask and Nasal Cannula  Additional Equipment:   Intra-op Plan:   Post-operative Plan:   Informed Consent: I have reviewed the patients History and Physical, chart, labs and discussed the procedure  including the risks, benefits and alternatives for the proposed anesthesia with the patient or authorized representative who has indicated his/her understanding and acceptance.     Dental advisory given  Plan Discussed with: CRNA  Anesthesia Plan Comments:          Anesthesia Quick Evaluation

## 2024-04-10 NOTE — Interval H&P Note (Signed)
 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  The various methods of treatment have been discussed with the patient and family. After consideration of risks, benefits and other options for treatment, the patient has consented to  Procedure(s): RIGHT/LEFT HEART CATH AND CORONARY ANGIOGRAPHY (N/A) as a surgical intervention.  The patient's history has been reviewed, patient examined, no change in status, stable for surgery.  I have reviewed the patient's chart and labs.  Questions were answered to the patient's satisfaction.     [name]

## 2024-04-10 NOTE — Telephone Encounter (Signed)
-----   Message from [name] sent at 04/10/2024  2:01 PM EDT ----- Walterine Cory - can you place a referral to HF Clinic for new consult regarding HFpEF and valvular heart disease? Thanks  Tinnie and Rockie - let's see what HF thinks before we discuss him.

## 2024-04-10 NOTE — Anesthesia Postprocedure Evaluation (Signed)
 Anesthesia Post Note  Patient: Phillip Kline  Procedure(s) Performed: TRANSESOPHAGEAL ECHOCARDIOGRAM ECHO TEE     Patient location during evaluation: PACU Anesthesia Type: MAC Level of consciousness: awake and alert Pain management: pain level controlled Vital Signs Assessment: post-procedure vital signs reviewed and stable Respiratory status: spontaneous breathing, nonlabored ventilation, respiratory function stable and patient connected to nasal cannula oxygen Cardiovascular status: blood pressure returned to baseline and stable Postop Assessment: no apparent nausea or vomiting Anesthetic complications: no   No notable events documented.  Last Vitals:  Vitals:   04/10/24 1200 04/10/24 1247  BP: 126/78   Pulse: 70   Resp: 16   Temp:    SpO2: 90% 93%    Last Pain:  Vitals:   04/10/24 1332  TempSrc:   PainSc: 0-No pain                 Lynwood MARLA Cornea

## 2024-04-10 NOTE — CV Procedure (Signed)
 TEE Anesthesia:  propofol  Patient had severe desaturations with initial anesthesia to 40% Unable to increase With jaw thrust and bagging. Elected to intubate patient and then continue with study  Massive bi atrial enlargement No LAA thrombus PFO present  MAC with restricted leaflet motion. Severe functional MR  Severe TR:  PPM lead in posterior anterior commisure and not Impinging leaflet motion.  Severe low gradient AS. Tri leaflet restricted leaflet motion AVA 0.76 cm2 DVI 0.24 mean gradient suboptimal only 7 mmHg.  Mild LVE global hypokinesis EF 45-50%  Mild RVE/hypokinesis  No effusion PPM leads in RA/RV  See full report in [place]  Maude Emmer MD [facility]

## 2024-04-10 NOTE — Interval H&P Note (Signed)
 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  The various methods of treatment have been discussed with the patient and family. After consideration of risks, benefits and other options for treatment, the patient has consented to  Procedure(s): RIGHT/LEFT HEART CATH AND CORONARY ANGIOGRAPHY (N/A) as a surgical intervention.  The patient's history has been reviewed, patient examined, no change in status, stable for surgery.  I have reviewed the patient's chart and labs.  Questions were answered to the patient's satisfaction.     [name]

## 2024-04-18 ENCOUNTER — Ambulatory Visit: Attending: Internal Medicine | Admitting: *Deleted

## 2024-04-18 DIAGNOSIS — I4891 Unspecified atrial fibrillation: Secondary | ICD-10-CM

## 2024-04-18 DIAGNOSIS — Z5181 Encounter for therapeutic drug level monitoring: Secondary | ICD-10-CM | POA: Diagnosis not present

## 2024-04-18 LAB — POCT INR: INR: 1.6 — AB (ref 2.0–3.0)

## 2024-04-18 NOTE — Progress Notes (Signed)
 Description   INR-1.6; Today take another whole tablet then continue taking warfarin 2 tablets daily except for 1 tablet on Sundays, Wednesdays and Saturdays. Hernia Repair TBD Stay consistent with greens each week (1-2 per week)  Recheck INR in 2 weeks.  Coumadin  Clinic [phone] Main # [phone]

## 2024-04-18 NOTE — Patient Instructions (Addendum)
 Description   INR-1.6; Today take another whole tablet then continue taking warfarin 2 tablets daily except for 1 tablet on Sundays, Wednesdays and Saturdays. Hernia Repair TBD Stay consistent with greens each week (1-2 per week)  Recheck INR in 2 weeks.  Coumadin  Clinic [phone] Main # [phone]

## 2024-05-03 ENCOUNTER — Ambulatory Visit: Attending: Internal Medicine | Admitting: *Deleted

## 2024-05-03 DIAGNOSIS — Z5181 Encounter for therapeutic drug level monitoring: Secondary | ICD-10-CM

## 2024-05-03 DIAGNOSIS — I4891 Unspecified atrial fibrillation: Secondary | ICD-10-CM | POA: Diagnosis not present

## 2024-05-03 LAB — POCT INR: INR: 2.9 (ref 2.0–3.0)

## 2024-05-03 NOTE — Progress Notes (Signed)
 Description   INR-2.9; Continue taking warfarin 2 tablets daily except for 1 tablet on Sundays, Wednesdays and Saturdays. Hernia Repair TBD Stay consistent with greens each week (1-2 per week)  Recheck INR in 4 weeks.  Coumadin  Clinic [phone] Main # [phone]

## 2024-05-03 NOTE — Patient Instructions (Signed)
 Description   INR-2.9; Continue taking warfarin 2 tablets daily except for 1 tablet on Sundays, Wednesdays and Saturdays. Hernia Repair TBD Stay consistent with greens each week (1-2 per week)  Recheck INR in 4 weeks.  Coumadin  Clinic [phone] Main # [phone]

## 2024-05-11 ENCOUNTER — Encounter (HOSPITAL_COMMUNITY): Payer: Self-pay | Admitting: Internal Medicine

## 2024-05-11 ENCOUNTER — Telehealth (HOSPITAL_COMMUNITY): Payer: Self-pay

## 2024-05-11 ENCOUNTER — Ambulatory Visit (HOSPITAL_COMMUNITY)
Admit: 2024-05-11 | Discharge: 2024-05-11 | Disposition: A | Discharge status: Home / Self Care | Attending: Internal Medicine | Admitting: Internal Medicine

## 2024-05-11 ENCOUNTER — Other Ambulatory Visit (HOSPITAL_COMMUNITY): Payer: Self-pay

## 2024-05-11 VITALS — BP 110/74 | HR 71 | Ht 64.75 in | Wt 142.2 lb

## 2024-05-11 DIAGNOSIS — Z79899 Other long term (current) drug therapy: Secondary | ICD-10-CM | POA: Diagnosis not present

## 2024-05-11 DIAGNOSIS — I083 Combined rheumatic disorders of mitral, aortic and tricuspid valves: Secondary | ICD-10-CM | POA: Diagnosis present

## 2024-05-11 DIAGNOSIS — I5022 Chronic systolic (congestive) heart failure: Secondary | ICD-10-CM

## 2024-05-11 DIAGNOSIS — Z7984 Long term (current) use of oral hypoglycemic drugs: Secondary | ICD-10-CM | POA: Insufficient documentation

## 2024-05-11 DIAGNOSIS — I350 Nonrheumatic aortic (valve) stenosis: Secondary | ICD-10-CM | POA: Diagnosis not present

## 2024-05-11 DIAGNOSIS — Z950 Presence of cardiac pacemaker: Secondary | ICD-10-CM | POA: Diagnosis not present

## 2024-05-11 DIAGNOSIS — I340 Nonrheumatic mitral (valve) insufficiency: Secondary | ICD-10-CM | POA: Diagnosis not present

## 2024-05-11 DIAGNOSIS — Z7901 Long term (current) use of anticoagulants: Secondary | ICD-10-CM | POA: Diagnosis not present

## 2024-05-11 DIAGNOSIS — I110 Hypertensive heart disease with heart failure: Secondary | ICD-10-CM | POA: Insufficient documentation

## 2024-05-11 DIAGNOSIS — I495 Sick sinus syndrome: Secondary | ICD-10-CM | POA: Diagnosis not present

## 2024-05-11 DIAGNOSIS — I480 Paroxysmal atrial fibrillation: Secondary | ICD-10-CM | POA: Diagnosis not present

## 2024-05-11 MED ORDER — EMPAGLIFLOZIN 10 MG PO TABS: 10.0000 mg | ORAL_TABLET | Freq: Every day | ORAL | 5 refills | Status: DC

## 2024-05-11 NOTE — Patient Instructions (Signed)
 Medication Changes:  STOP HYDROCHLOROTHIAZIDE    START JARDIANCE 10MG  ONCE DAILY   Follow-Up in: 6 MONTHS WITH AN ECHO PLEASE CALL OUR OFFICE AROUND FEBRUARY TO GET SCHEDULED FOR YOUR APPOINTMENT. PHONE NUMBER IS [phone] OPTION 2  At the [facility], you and your health needs are our priority. We have a designated team specialized in the treatment of Heart Failure. This Care Team includes your primary Heart Failure Specialized Cardiologist (physician), Advanced Practice Providers (APPs- Physician Assistants and Nurse Practitioners), and Pharmacist who all work together to provide you with the care you need, when you need it.   You may see any of the following providers on your designated Care Team at your next follow up:  Dr. Toribio Fuel Dr. Ezra Shuck Dr. Ria Commander Dr. Odis Brownie Greig Mosses, NP Caffie Shed, [place] [facility] [place], [place] Beckey Coe, NP [place] Lee, NP Tinnie Redman, PharmD   Please be sure to bring in all your medications bottles to every appointment.   Need to Contact Us :  If you have any questions or concerns before your next appointment please send us  a message through [place] or call our office at [phone].    TO LEAVE A MESSAGE FOR THE NURSE SELECT OPTION 2, PLEASE LEAVE A MESSAGE INCLUDING: YOUR NAME DATE OF BIRTH CALL BACK NUMBER REASON FOR CALL**this is important as we prioritize the call backs  YOU WILL RECEIVE A CALL BACK THE SAME DAY AS LONG AS YOU CALL BEFORE 4:00 PM

## 2024-05-11 NOTE — Progress Notes (Signed)
 [facility] CONSULT NOTE  Referring Physician: [facility] Primary Care: [facility], [facility] Primary Cardiologist: Danelle Birmingham, MD  Chief Complaint: Valvular heart disease  HPI:  Phillip Kline is a 78 y.o. male who presents for evaluation of multivalve disease, referred by Dr Copper  He has a history of paroxysmal atrial fibrillation, permanent pacemaker, and valvular heart disease.   Echo 03/20/24 LVEF 45-50%, mild concentric LVH, grade 3 diastolic dysfunction, moderately reduced RV function, massive biatrial dilatation, moderate to severe MR, moderate low-flow low gradient AS, and severe TR.    TEE 04/10/24 EF 45-50% RV mild reduced Massive BAE. Severe functional MR/TR. Severe AS  Seen by Dr. Wonda and underwent R/L cath as below and referred here   1.  Patent coronary arteries with dense calcification and 50% stenosis at the ostium of the RCA. 2.  Patent left main, LAD, intermediate branches, and left circumflex with a 50% mid LAD stenosis and no other significant disease noted 3.  Calcified aortic valve, crossed with a straight wire, with mean gradient 14 mmHg, valve area 1.36 cm, suspect moderate low-flow low gradient aortic stenosis 4.  Known severe mitral regurgitation with a 27 mmHg V wave 5.  Right heart catheterization data: RA mean 12 mmHg RV 46/12 mmHg PA 45/22 mean 31 mmHg Pulmonary capillary wedge pressure A-wave 19, V wave 27, mean 21 mmHg LVEDP 18 mmHg Cardiac output 3.91 L/min Cardiac index 2.37 Transpulmonary gradient 10 mmHg with PVR 2.56 Wood units   Says he feels pretty good. He lives by himself. Goes to store by himself. Gets SOB walking up hills but otherwise ok. No CP, dizziness, presyncope or edema.    Past Medical History:  Diagnosis Date   A-fib (HCC)    Abdominal hernia    Anxiety    Arthritis    Atrial fibrillation (HCC)    Atrial flutter (HCC)    Blood transfusion without reported diagnosis    Cataract     bilateral cataracts removed   Clotting disorder    coumadin  - a fib   Coronary artery disease    Depression    Hyperlipidemia    Hypertension    Pacemaker    [employer]   Sinus bradycardia    Sleep apnea    wears a c-pap    Current Outpatient Medications  Medication Sig Dispense Refill   acetaminophen  (TYLENOL ) 325 MG tablet Take 2 tablets (650 mg total) by mouth every 6 (six) hours as needed for fever.     atorvastatin  (LIPITOR) 80 MG tablet Take 40 mg by mouth at bedtime.      Calcium  Carbonate-Vitamin D  500-125 MG-UNIT TABS Take 1 tablet by mouth 2 (two) times daily.     digoxin  (LANOXIN ) 0.125 MG tablet TAKE 1 TABLET BY MOUTH DAILY. PLEASE KEEP NOVEMBER APPT TO GET MORE REFILLS. 90 tablet 3   ferrous sulfate 325 (65 FE) MG tablet Take 325 mg by mouth daily with breakfast.     hydrochlorothiazide  (MICROZIDE ) 12.5 MG capsule Take 12.5 mg by mouth daily.     lisinopril  (ZESTRIL ) 2.5 MG tablet Take 2.5 mg by mouth daily.     QUEtiapine  (SEROQUEL ) 25 MG tablet Take 12.5 mg by mouth at bedtime.      sertraline  (ZOLOFT ) 100 MG tablet Take 200 mg by mouth daily.      sildenafil (VIAGRA) 50 MG tablet Take by mouth as needed.     thiamine  100 MG tablet Take 100 mg by mouth daily.  verapamil  (CALAN -SR) 180 MG CR tablet Take 1 tablet by mouth daily.     vitamin B-12 (CYANOCOBALAMIN ) 500 MCG tablet Take 500 mcg by mouth 2 (two) times daily.     warfarin (COUMADIN ) 2 MG tablet Take 1-2 tablets (2-4 mg total) by mouth daily as directed by the coumadin  clinic. 60 tablet 2   No current facility-administered medications for this encounter.    No Known Allergies    Social History   Socioeconomic History   Marital status: Single    Spouse name: Not on file   Number of children: Not on file   Years of education: Not on file   Highest education level: Not on file  Occupational History   Not on file  Tobacco Use   Smoking status: Never   Smokeless tobacco: Never   Tobacco  comments:    Marijuana  Vaping Use   Vaping status: Never Used  Substance and Sexual Activity   Alcohol use: Yes    Comment: 4-5 beers weekly   Drug use: Not Currently    Types: Marijuana    Comment: smoked marijuana 09-26-16   Sexual activity: Yes    Partners: Female    Birth control/protection: None  Other Topics Concern   Not on file  Social History Narrative   ** Merged History Encounter **       Pt reports having a girlfriend but plans to break up with her   Social Drivers of Corporate investment banker Strain: Low Risk  (03/31/2018)   Overall Financial Resource Strain (CARDIA)    Difficulty of Paying Living Expenses: Not hard at all  Food Insecurity: No Food Insecurity (03/31/2018)   Hunger Vital Sign    Worried About Running Out of Food in the Last Year: Never true    Ran Out of Food in the Last Year: Never true  Transportation Needs: No Transportation Needs (03/31/2018)   PRAPARE - Administrator, Civil Service (Medical): No    Lack of Transportation (Non-Medical): No  Physical Activity: Inactive (03/31/2018)   Exercise Vital Sign    Days of Exercise per Week: 0 days    Minutes of Exercise per Session: 0 min  Stress: Stress Concern Present (03/31/2018)   [employer] of Occupational Health - Occupational Stress Questionnaire    Feeling of Stress : To some extent  Social Connections: Moderately Integrated (03/31/2018)   Social Connection and Isolation Panel    Frequency of Communication with Friends and Family: More than three times a week    Frequency of Social Gatherings with Friends and Family: Once a week    Attends Religious Services: More than 4 times per year    Active Member of [employer] or Organizations: Yes    Attends Banker Meetings: 1 to 4 times per year    Marital Status: Never married  Intimate Partner Violence: Not At Risk (03/31/2018)   Humiliation, Afraid, Rape, and Kick questionnaire    Fear of Current or Ex-Partner: No     Emotionally Abused: No    Physically Abused: No    Sexually Abused: No      Family History  Problem Relation Age of Onset   Cancer Father        lung cancer   Stomach cancer Mother    Stomach cancer Sister    Colon cancer Neg Hx    Esophageal cancer Neg Hx    Pancreatic cancer Neg Hx    Prostate cancer Neg  Hx    Rectal cancer Neg Hx     Vitals:   05/11/24 1452  BP: 110/74  Pulse: 71  SpO2: 92%  Weight: 64.5 kg (142 lb 3.2 oz)  Height: 5' 4.75 (1.645 m)    PHYSICAL EXAM: General:  Well appearing. No respiratory difficulty HEENT: normal Neck: supple. no JVD. Carotids 2+ bilat; no bruits. No lymphadenopathy or thryomegaly appreciated. Cor: PMI nondisplaced. Regular rate & rhythm. 2/6 AS  s2 ok 2.6 MR  Lungs: clear decreased throughout Abdomen: soft, nontender, nondistended. No hepatosplenomegaly. No bruits or masses. Good bowel sounds. Extremities: no cyanosis, clubbing, rash, edema Neuro: alert & oriented x 3, cranial nerves grossly intact. moves all 4 extremities w/o difficulty. Affect pleasant.  ECG: AV paced Personally reviewed   ASSESSMENT & PLAN:  1. 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 Situation also complicated my moderate to severe LF/LG AS - Currently NYHA II - Volume ok - Given restrictive CM and massive BAE typically would not consider valvular intervention on MR/TR but can reserve that decision for later. If becomes symptomatic would start with TAVR - Will add Jardiance 10 - Continue lisinopril  2.5 - Can add spiro if tolerates  2. 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 Moderate to severe low-flow low  gradient aortic stenosis - TAVR when symptomatic  4.  Paroxysmal atrial fibrillation on warfarin - surprisingly in NSR currently. - continue AC  5. Sinus node dysfunction - s/p Dual chamber BSci PPM  - followed by EP  D/w Dr. Wonda.   I spent a total of 52 minutes today: 1) reviewing the patient's medical records including previous charts, labs and recent notes from other providers; 2) examining the patient and counseling them on their medical issues/explaining the plan of care; 3) adjusting meds as needed and 4) ordering lab work or other needed tests.    Toribio Fuel, MD  3:29 PM

## 2024-05-11 NOTE — Telephone Encounter (Signed)
 Advanced Heart Failure Patient Advocate Encounter  Test billing for this patient's current coverage ([employer] Medicare) returns a $25 copay for 30 day supply of Jardiance, $75 for 90 days.  This test claim was processed through  [facility]- copay amounts may vary at other pharmacies due to pharmacy/plan contracts, or as the patient moves through the different stages of their insurance plan.  Rachel DEL, CPhT Rx Patient Advocate Phone: [phone]

## 2024-05-19 ENCOUNTER — Other Ambulatory Visit: Payer: Self-pay | Admitting: Physician Assistant

## 2024-05-19 MED ORDER — EMPAGLIFLOZIN 10 MG PO TABS: 10.0000 mg | ORAL_TABLET | Freq: Every day | ORAL | 5 refills | Status: AC

## 2024-05-31 ENCOUNTER — Ambulatory Visit: Attending: Internal Medicine | Admitting: Pharmacist

## 2024-05-31 DIAGNOSIS — Z5181 Encounter for therapeutic drug level monitoring: Secondary | ICD-10-CM

## 2024-05-31 DIAGNOSIS — I4891 Unspecified atrial fibrillation: Secondary | ICD-10-CM | POA: Diagnosis not present

## 2024-05-31 LAB — POCT INR: INR: 2.7 (ref 2.0–3.0)

## 2024-05-31 NOTE — Patient Instructions (Addendum)
 Description   INR-2.7; Continue taking warfarin 2 tablets daily except for 1 tablet on Sundays, Wednesdays and Saturdays. Hernia Repair TBD Stay consistent with greens each week (1-2 per week)  Recheck INR in 5 weeks.  Coumadin  Clinic [phone] Main # [phone]

## 2024-05-31 NOTE — Progress Notes (Signed)
 Description   INR-2.7; Continue taking warfarin 2 tablets daily except for 1 tablet on Sundays, Wednesdays and Saturdays. Hernia Repair TBD Stay consistent with greens each week (1-2 per week)  Recheck INR in 5 weeks.  Coumadin  Clinic [phone] Main # [phone]

## 2024-07-05 ENCOUNTER — Ambulatory Visit: Attending: Internal Medicine

## 2024-07-05 DIAGNOSIS — Z5181 Encounter for therapeutic drug level monitoring: Secondary | ICD-10-CM | POA: Diagnosis not present

## 2024-07-05 DIAGNOSIS — I4891 Unspecified atrial fibrillation: Secondary | ICD-10-CM

## 2024-07-05 LAB — POCT INR: INR: 3.2 — AB (ref 2.0–3.0)

## 2024-07-05 NOTE — Patient Instructions (Signed)
 Description   INR-3.2; Skip today's dosage of Coumadin , then resume taking warfarin 2 tablets daily except for 1 tablet on Sundays, Wednesdays and Saturdays. Hernia Repair TBD Stay consistent with greens each week (1-2 per week)  Recheck INR in 3 weeks.  Coumadin  Clinic [phone] Main # [phone]

## 2024-07-05 NOTE — Progress Notes (Signed)
 Description   INR-3.2; Skip today's dosage of Coumadin , then resume taking warfarin 2 tablets daily except for 1 tablet on Sundays, Wednesdays and Saturdays. Hernia Repair TBD Stay consistent with greens each week (1-2 per week)  Recheck INR in 3 weeks.  Coumadin  Clinic [phone] Main # [phone]

## 2024-07-11 ENCOUNTER — Other Ambulatory Visit: Payer: Self-pay | Admitting: Cardiovascular Disease

## 2024-07-11 DIAGNOSIS — I4891 Unspecified atrial fibrillation: Secondary | ICD-10-CM

## 2024-07-11 NOTE — Telephone Encounter (Signed)
 Warfarin 2mg  Dx-Afib Last INR Check-07/05/24 Last OV- 05/11/24

## 2024-07-26 ENCOUNTER — Ambulatory Visit: Attending: Internal Medicine | Admitting: *Deleted

## 2024-07-26 DIAGNOSIS — I4891 Unspecified atrial fibrillation: Secondary | ICD-10-CM

## 2024-07-26 DIAGNOSIS — Z5181 Encounter for therapeutic drug level monitoring: Secondary | ICD-10-CM | POA: Diagnosis not present

## 2024-07-26 LAB — POCT INR: INR: 4 — AB (ref 2.0–3.0)

## 2024-07-26 NOTE — Patient Instructions (Signed)
 Description   INR-4.0; Do not take any warfarin tomorrow (already taken today's dose) then START taking warfarin 1 tablet daily except for 2 tablets on Monday, Tuesdays, and Thursdays. Hernia Repair TBD Stay consistent with greens each week (1-2 per week)  Recheck INR in 3 weeks.  Coumadin  Clinic [phone] Main # [phone]

## 2024-07-26 NOTE — Progress Notes (Signed)
 Description   INR-4.0; Do not take any warfarin tomorrow (already taken today's dose) then START taking warfarin 1 tablet daily except for 2 tablets on Monday, Tuesdays, and Thursdays. Hernia Repair TBD Stay consistent with greens each week (1-2 per week)  Recheck INR in 3 weeks.  Coumadin  Clinic [phone] Main # [phone]

## 2024-08-15 ENCOUNTER — Ambulatory Visit: Admitting: Pharmacist

## 2024-08-15 DIAGNOSIS — Z5181 Encounter for therapeutic drug level monitoring: Secondary | ICD-10-CM

## 2024-08-15 DIAGNOSIS — I4891 Unspecified atrial fibrillation: Secondary | ICD-10-CM | POA: Diagnosis not present

## 2024-08-15 LAB — POCT INR: INR: 3.4 — AB (ref 2.0–3.0)

## 2024-08-15 NOTE — Progress Notes (Signed)
 Description   INR-3.4; Do not take any warfarin tomorrow (already taken today's dose) then START taking warfarin 1 tablet daily except for 2 tablets on Monday, Tuesdays, and Thursdays. Hernia Repair TBD Stay consistent with greens each week (1-2 per week)  Recheck INR in 3 weeks.  Coumadin  Clinic [phone] Main # [phone]

## 2024-08-15 NOTE — Patient Instructions (Signed)
 Description   INR-3.4; Do not take any warfarin tomorrow (already taken today's dose) then START taking warfarin 1 tablet daily except for 2 tablets on Monday, Tuesdays, and Thursdays. Hernia Repair TBD Stay consistent with greens each week (1-2 per week)  Recheck INR in 3 weeks.  Coumadin  Clinic [phone] Main # [phone]

## 2024-08-31 ENCOUNTER — Ambulatory Visit: Admitting: *Deleted

## 2024-08-31 DIAGNOSIS — I4891 Unspecified atrial fibrillation: Secondary | ICD-10-CM | POA: Diagnosis not present

## 2024-08-31 DIAGNOSIS — Z5181 Encounter for therapeutic drug level monitoring: Secondary | ICD-10-CM | POA: Diagnosis not present

## 2024-08-31 LAB — POCT INR: POC INR: 1.3

## 2024-08-31 NOTE — Patient Instructions (Signed)
 Description   INR-1.3, Take 6 mg of warfarin today and tomorrow take 3mg  Then continue to take warfarin 2 mg daily except for 4mg  on Monday, Tuesday and Thursday.  Hernia Repair TBD Stay consistent with greens each week (1-2 per week)  Recheck INR in 2 weeks.  Coumadin  Clinic [phone] Main # [phone]

## 2024-08-31 NOTE — Progress Notes (Signed)
 Lab Results  Component Value Date   INR 1.3 08/31/2024   INR 3.4 (A) 08/15/2024   INR 4.0 (A) 07/26/2024   PROTIME 22.8 12/25/2008    Description   INR-1.3, Take 6 mg of warfarin today and tomorrow take 3mg  Then continue to take warfarin 2 mg daily except for 4mg  on Monday, Tuesday and Thursday.  Hernia Repair TBD Stay consistent with greens each week (1-2 per week)  Recheck INR in 2 weeks.  Coumadin  Clinic [phone] Main # [phone]

## 2024-09-14 ENCOUNTER — Ambulatory Visit
